# Patient Record
Sex: Female | Born: 1965
Health system: Southern US, Community
[De-identification: ages and names within clinical notes are randomized; demographics above are authoritative.]

## PROBLEM LIST (undated history)

## (undated) DIAGNOSIS — I4901 Ventricular fibrillation: Secondary | ICD-10-CM

## (undated) DIAGNOSIS — I2109 ST elevation (STEMI) myocardial infarction involving other coronary artery of anterior wall: Secondary | ICD-10-CM

## (undated) DIAGNOSIS — I469 Cardiac arrest, cause unspecified: Secondary | ICD-10-CM

## (undated) DIAGNOSIS — I2510 Atherosclerotic heart disease of native coronary artery without angina pectoris: Secondary | ICD-10-CM

## (undated) DIAGNOSIS — I219 Acute myocardial infarction, unspecified: Secondary | ICD-10-CM

## (undated) DIAGNOSIS — I255 Ischemic cardiomyopathy: Secondary | ICD-10-CM

## (undated) DIAGNOSIS — K219 Gastro-esophageal reflux disease without esophagitis: Secondary | ICD-10-CM

## (undated) DIAGNOSIS — T7840XA Allergy, unspecified, initial encounter: Secondary | ICD-10-CM

## (undated) DIAGNOSIS — E079 Disorder of thyroid, unspecified: Secondary | ICD-10-CM

## (undated) HISTORY — DX: Acute myocardial infarction, unspecified: I21.9

## (undated) HISTORY — DX: Ventricular fibrillation: I49.01

## (undated) HISTORY — DX: Atherosclerotic heart disease of native coronary artery without angina pectoris: I25.10

## (undated) HISTORY — DX: Cardiac arrest, cause unspecified: I46.9

## (undated) HISTORY — DX: ST elevation (STEMI) myocardial infarction involving other coronary artery of anterior wall: I21.09

## (undated) HISTORY — PX: LUMBAR DISC SURGERY: SHX700

## (undated) HISTORY — DX: Gastro-esophageal reflux disease without esophagitis: K21.9

## (undated) HISTORY — DX: Allergy, unspecified, initial encounter: T78.40XA

## (undated) HISTORY — DX: Ischemic cardiomyopathy: I25.5

---

## 2006-12-07 ENCOUNTER — Ambulatory Visit (HOSPITAL_COMMUNITY): Admission: RE | Admit: 2006-12-07 | Discharge: 2006-12-07 | Payer: Self-pay | Admitting: Family Medicine

## 2007-06-22 ENCOUNTER — Encounter: Admission: RE | Admit: 2007-06-22 | Discharge: 2007-08-03 | Payer: Self-pay | Admitting: Family Medicine

## 2008-09-20 ENCOUNTER — Ambulatory Visit (HOSPITAL_COMMUNITY): Admission: RE | Admit: 2008-09-20 | Discharge: 2008-09-21 | Payer: Self-pay | Admitting: Neurosurgery

## 2011-05-11 NOTE — Op Note (Signed)
NAMECHAELA, BRANSCUM                  ACCOUNT NO.:  [id number]   MEDICAL RECORD NO.:  [id number]          PATIENT TYPE:  OIB   LOCATION:  3533                         FACILITY:  [facility]   PHYSICIAN:  Coletta Memos, M.D.     DATE OF BIRTH:  August 10, 1966   DATE OF PROCEDURE:  DATE OF DISCHARGE:                               OPERATIVE REPORT   PREOPERATIVE DIAGNOSES:  1. Displaced disk left L5-S1.  2. Left L5 and left S1 radiculopathies.   POSTOPERATIVE DIAGNOSES:  1. Displaced disk left L5-S1.  2. Left L5 and left S1 radiculopathies.   PROCEDURE:  Left L5 semi hemilaminectomy and diskectomy.  Disk space was  not entered.  This was done with microdissection.   ANESTHESIA:  General endotracheal.   SURGEON:  Coletta Memos, MD   ASSISTANT:  Stefani Dama, MD   COMPLICATIONS:  None.   FINDINGS:  Large free fragment of disk material removed with large free  fragment of disk.   INDICATIONS:  Wretha Laris is a young lady who has been in significant  pain for approximately 1 week.  MRI showed a large disk herniation at L5-  S1.  I therefore offered and she agreed to undergo operative  decompression.  She was to the point where she was unable to walk  secondary to pain.   OPERATIVE NOTE:  Ms. Sheffield Slider was brought to the operating room, intubated,  and placed under general anesthetic.  She rolled prone onto the Wilson  frame and all pressure points were properly padded.  Her back was  prepped.  She was draped in a sterile fashion.  I infiltrated 0.5%  lidocaine with 1:200,000 epinephrine.  I opened the skin with a #10  blade and took this down to the thoracolumbar fascia.  I then exposed  the lamina of L5 and S1 on the left side.  Intraoperative x-ray showed I  was in the correct interlaminar space.  I then performed a semi  hemilaminectomy of L5 using a high-speed drill and Kerrison punches.  I  removed the ligamentum flavum and exposed the thecal sac on the left  side.  With microdissection,  I retracted thecal sac medially and  immediately was able to break into the space where the disk was.  I then  removed large fragments of disk material.  I then inspected with Dr.  Verlee Rossetti assistance  the ventral portion of the sac to make sure that no fragments were left  behind.  All the fragments that we were able to appreciate were removed.  I then irrigated.  I then closed wound in layered fashion using Vicryl  sutures.  Dermabond was used for a sterile dressing.  She was extubated  and moving all extremities.           ______________________________  Coletta Memos, M.D.     KC/MEDQ  D:  09/20/2008  T:  09/21/2008  Job:  [id number]

## 2011-09-27 LAB — CBC
Hemoglobin: 14.6
MCV: 96.1
Platelets: 284
RDW: 13.3
WBC: 10.2

## 2012-02-15 ENCOUNTER — Other Ambulatory Visit: Payer: Self-pay | Admitting: Family Medicine

## 2012-02-15 ENCOUNTER — Ambulatory Visit (HOSPITAL_COMMUNITY)
Admission: RE | Admit: 2012-02-15 | Discharge: 2012-02-15 | Disposition: A | Discharge status: Home / Self Care | Payer: BC Managed Care – PPO | Source: Ambulatory Visit | Attending: Family Medicine | Admitting: Family Medicine

## 2012-02-15 DIAGNOSIS — IMO0001 Reserved for inherently not codable concepts without codable children: Secondary | ICD-10-CM

## 2012-02-15 DIAGNOSIS — K30 Functional dyspepsia: Secondary | ICD-10-CM

## 2012-02-15 DIAGNOSIS — R109 Unspecified abdominal pain: Secondary | ICD-10-CM | POA: Insufficient documentation

## 2012-02-15 DIAGNOSIS — R1011 Right upper quadrant pain: Secondary | ICD-10-CM

## 2012-03-07 ENCOUNTER — Other Ambulatory Visit: Payer: Self-pay | Admitting: Family Medicine

## 2012-03-10 ENCOUNTER — Ambulatory Visit (HOSPITAL_COMMUNITY)
Admission: RE | Admit: 2012-03-10 | Discharge: 2012-03-10 | Disposition: A | Discharge status: Home / Self Care | Payer: Federal, State, Local not specified - PPO | Source: Ambulatory Visit | Attending: Family Medicine | Admitting: Family Medicine

## 2012-03-10 ENCOUNTER — Other Ambulatory Visit: Payer: Self-pay | Admitting: Family Medicine

## 2012-03-10 DIAGNOSIS — R1013 Epigastric pain: Secondary | ICD-10-CM | POA: Insufficient documentation

## 2012-03-10 DIAGNOSIS — R079 Chest pain, unspecified: Secondary | ICD-10-CM | POA: Insufficient documentation

## 2012-03-14 ENCOUNTER — Encounter: Payer: Self-pay | Admitting: Internal Medicine

## 2012-03-30 ENCOUNTER — Encounter: Payer: Self-pay | Admitting: Internal Medicine

## 2012-04-03 ENCOUNTER — Encounter: Payer: Self-pay | Admitting: Internal Medicine

## 2012-04-03 ENCOUNTER — Ambulatory Visit (INDEPENDENT_AMBULATORY_CARE_PROVIDER_SITE_OTHER): Payer: Federal, State, Local not specified - PPO | Admitting: Internal Medicine

## 2012-04-03 VITALS — BP 124/76 | HR 76 | Ht 65.0 in | Wt 119.8 lb

## 2012-04-03 DIAGNOSIS — F17200 Nicotine dependence, unspecified, uncomplicated: Secondary | ICD-10-CM

## 2012-04-03 DIAGNOSIS — K3189 Other diseases of stomach and duodenum: Secondary | ICD-10-CM

## 2012-04-03 DIAGNOSIS — Z720 Tobacco use: Secondary | ICD-10-CM

## 2012-04-03 DIAGNOSIS — R1013 Epigastric pain: Secondary | ICD-10-CM

## 2012-04-03 NOTE — Patient Instructions (Addendum)
You have been given a separate informational sheet regarding your tobacco use, the importance of quitting and local resources to help you quit.  You have been scheduled for an endoscopy with propofol. Please follow written instructions given to you at your visit today.  Please follow up with Dr. Rhea Belton in 1 month.

## 2012-04-04 ENCOUNTER — Encounter: Payer: Self-pay | Admitting: Internal Medicine

## 2012-04-04 DIAGNOSIS — Z87891 Personal history of nicotine dependence: Secondary | ICD-10-CM | POA: Insufficient documentation

## 2012-04-04 DIAGNOSIS — Z720 Tobacco use: Secondary | ICD-10-CM | POA: Insufficient documentation

## 2012-04-04 DIAGNOSIS — R1013 Epigastric pain: Secondary | ICD-10-CM | POA: Insufficient documentation

## 2012-04-04 NOTE — Progress Notes (Signed)
Subjective:    Patient ID: Sarah Fox, female    DOB: 01-26-1966, 46 y.o.   MRN: [id number]  HPI Sarah Fox is a 46 yo female with no significant past medical history who is seen in consultation at the request of Dr. Christell Constant for evaluation of epigastric abdominal pain.  [name] is accompanied today by a friend.  She reports a weeks ago developing constant epigastric pain which was across her upper abdomen. It also was present in her chest and mid back. This was specifically worse with eating food. She was experiencing some nausea but no vomiting. She also at that time was having heartburn. She reported belching prior to PPI, which subsequently improved. She was seen by her primary care, and initially was started on omeprazole 40 mg daily. She reports having undergone an ultrasound and an abdominal CT scan. She reports about 4 weeks after beginning the omeprazole her symptoms improved. She still has some epigastric pain, but this is significantly better than before. It still is slightly worse with eating. No further nausea or vomiting. No melena or rectal bleeding. No diarrhea or constipation. No dysphagia or odynophagia. No fevers or chills. She reports using intermittent Naprosyn and ibuprofen  Review of Systems As per history of present illness, otherwise negative  History reviewed. No pertinent past medical history.  Current Outpatient Prescriptions  Medication Sig Dispense Refill  . omeprazole (PRILOSEC) 40 MG capsule Take 40 mg by mouth daily.       No Known Allergies  Family History  Problem Relation Age of Onset  . Breast cancer Mother   . Breast cancer Sister   . Heart disease Brother   . Colon cancer Neg Hx    History  Substance Use Topics  . Smoking status: Current Everyday Smoker  . Smokeless tobacco: Never Used  . Alcohol Use: Yes     4 drinks daily       Objective:   Physical Exam BP 124/76  Pulse 76  Ht 5\' 5"  (1.651 m)  Wt 119 lb 12.8 oz (54.341 kg)  BMI 19.94  kg/m2  LMP 03/20/2012 Constitutional: Well-developed and well-nourished. No distress. HEENT: Normocephalic and atraumatic. Oropharynx is clear and moist. No oropharyngeal exudate. Conjunctivae are normal. Pupils are equal round and reactive to light. No scleral icterus. Neck: Neck supple. Trachea midline. Cardiovascular: Normal rate, regular rhythm and intact distal pulses. No M/R/G Pulmonary/chest: Effort normal and breath sounds normal. No wheezing, rales or rhonchi. Abdominal: Soft, mild epigastric tenderness without rebound or guarding, nondistended. Bowel sounds active throughout. There are no masses palpable. No hepatosplenomegaly. Extremities: no clubbing, cyanosis, or edema Lymphadenopathy: No cervical adenopathy noted. Neurological: Alert and oriented to person place and time. Skin: Skin is warm and dry. No rashes noted. Psychiatric: Normal mood and affect. Behavior is normal.  Records review COMPLETE ABDOMINAL ULTRASOUND 02/15/2012   Comparison:  None.   Findings:   Gallbladder:  No gallstones, gallbladder wall thickening, or pericholecystic fluid. No sonographic Murphy's sign elicited.   Common bile duct:  Normal measuring 2 mm in diameter.   Liver:  No focal lesion identified.  Within normal limits in parenchymal echogenicity.   IVC:  Appears normal.   Pancreas:  No focal abnormality seen.   Spleen:  Normal measuring 8.7 cm in length.   Right Kidney:  Normal measuring 11.1 cm in length.   Left Kidney:  Normal measuring 10.2 cm in length.   Abdominal aorta:  No aneurysm identified.   IMPRESSION: Normal abdominal ultrasound.  Clinical Data: Severe epigastric, back, and chest pain x 4 weeks   CT ABDOMEN AND PELVIS WITHOUT CONTRAST 03/07/2012   Technique:  Multidetector CT imaging of the abdomen and pelvis was performed following the standard protocol without intravenous contrast.   Comparison: Ultrasound dated 02/15/2012   Findings: Lung bases are clear.    Unenhanced liver is notable for a tiny lesion in the hepatic dome (series 2/image 11), too small to characterize.   Spleen, pancreas, and adrenal glands are within normal limits.   Gallbladder is unremarkable.   Kidneys are within normal limits.  No renal calculi or hydronephrosis.   No evidence of bowel obstruction.  Normal appendix.   No evidence of abdominal aortic aneurysm.   No abdominopelvic ascites.   No suspicious abdominopelvic lymphadenopathy.   Uterus and bilateral ovaries are unremarkable.   No ureteral or bladder calculi.   Mild degenerative changes of the visualized thoracolumbar spine, most prominent at L5-S1.   IMPRESSION: No evidence of bowel obstruction.  Normal appendix.   No CT findings to account for the patient's abdominal pain.     Assessment & Plan:  45 yo female with no significant past medical history who is seen in consultation at the request of Dr. Christell Constant for evaluation of epigastric abdominal pain.  1. Epigastric abd pain -- the patient does have persistent epigastric pain, but it has improved with daily PPI therapy. Her ultrasound and abdominal CT were reviewed and are reassuring. She has no evidence of gallbladder disease. The most likely diagnosis is dyspepsia/peptic ulcer disease, which likely is and has improved with PPI therapy. Given that this pain has been persistent for 8 weeks, and some part, I recommended upper endoscopy. We discussed this test today including the risks and benefits and she is agreeable to proceed. I think that she should continue on omeprazole 40 mg daily, 30 minutes to one hour before breakfast. I've asked that she call me if symptoms worsen prior to endoscopy.  One of your biggest health concerns is your smoking.  This increases your risk for most cancers and serious cardiovascular diseases such as strokes, heart attacks.  You should try your best to stop.  If you need assistance, please contact your PCP or Smoking  Cessation Class at [facility] [phone]) or [facility] Quit-Line (1-800-QUIT-NOW).

## 2012-04-17 ENCOUNTER — Ambulatory Visit (AMBULATORY_SURGERY_CENTER): Payer: Federal, State, Local not specified - PPO | Admitting: Internal Medicine

## 2012-04-17 ENCOUNTER — Encounter: Payer: Self-pay | Admitting: Internal Medicine

## 2012-04-17 VITALS — BP 126/80 | HR 80 | Temp 99.3°F | Resp 17 | Ht 65.0 in | Wt 119.0 lb

## 2012-04-17 DIAGNOSIS — R1013 Epigastric pain: Secondary | ICD-10-CM

## 2012-04-17 DIAGNOSIS — K2100 Gastro-esophageal reflux disease with esophagitis, without bleeding: Secondary | ICD-10-CM

## 2012-04-17 DIAGNOSIS — K2990 Gastroduodenitis, unspecified, without bleeding: Secondary | ICD-10-CM

## 2012-04-17 DIAGNOSIS — K3189 Other diseases of stomach and duodenum: Secondary | ICD-10-CM

## 2012-04-17 DIAGNOSIS — K2970 Gastritis, unspecified, without bleeding: Secondary | ICD-10-CM

## 2012-04-17 MED ORDER — SODIUM CHLORIDE 0.9 % IV SOLN: 500.0000 mL | INTRAVENOUS | Status: DC

## 2012-04-17 NOTE — Progress Notes (Signed)
Marrion Coy, CRNA administered propofol to the pt.  Andrey Farmer, CRNA observed Marrion Coy in the procedure room. Maw

## 2012-04-17 NOTE — Op Note (Signed)
[facility] [address] [place], [place]  [address]  ENDOSCOPY PROCEDURE REPORT  PATIENT:  Sarah Fox, Sarah Fox  MR#:  [id number] BIRTHDATE:  1966/07/31, 45 yrs. old  GENDER:  female ENDOSCOPIST:  Carie Caddy. Mimi Debellis, MD Referred by:  Rudi Heap, M.D. PROCEDURE DATE:  04/17/2012 PROCEDURE:  EGD with biopsy, 43239 ASA CLASS:  Class I INDICATIONS:  dyspepsia MEDICATIONS:   MAC sedation, administered by CRNA, propofol (Diprivan) 300 mg IV TOPICAL ANESTHETIC:  none  DESCRIPTION OF PROCEDURE:   After the risks benefits and alternatives of the procedure were thoroughly explained, informed consent was obtained.  [facility] [id number] endoscope was introduced through the mouth and advanced to the second portion of the duodenum, without limitations.  The instrument was slowly withdrawn as the mucosa was fully examined. <<PROCEDUREIMAGES>>  Irregular Z-line at the gastroesophageal junction. Multiple biopsies were obtained and sent to pathology to exclude Barrett's metaplasia.  Otherwise normal esophagus.  The stomach was entered and closely examined. The antrum, angularis, and lesser curvature were well visualized, including a retroflexed view of the cardia and fundus. The stomach wall was normally distensable. The scope passed easily through the pylorus into the duodenum. Biopsies of the antrum and body of the stomach were obtained and sent to pathology.  The duodenal bulb was normal in appearance, as was the postbulbar duodenum.    Retroflexed views revealed no abnormalities.    The scope was then withdrawn from the patient and the procedure completed.  COMPLICATIONS:  None  ENDOSCOPIC IMPRESSION: 1) Irregular Z-line at the gastroesophageal junction.  Biopsies performed to exclude Barrett's. 2) Otherwise normal esophagus. 3) Normal stomach, biopsies performed to rule out H. Pylori. 4) Normal duodenum  RECOMMENDATIONS: 1) Await pathology results 2) Continue taking your PPI  (antiacid medicine) once daily. It is best to be taken 20-30 minutes prior to breakfast meal.  Carie Caddy. Rhea Belton, MD  CC:  Rudi Heap, MD The Patient  n. eSIGNED:   Carie Caddy. Evangelynn Lochridge at 04/17/2012 03:57 PM  Duffy Bruce, [phone]

## 2012-04-17 NOTE — Progress Notes (Signed)
Patient did not experience any of the following events: a burn prior to discharge; a fall within the facility; wrong site/side/patient/procedure/implant event; or a hospital transfer or hospital admission upon discharge from the facility. (G8907) Patient did not have preoperative order for IV antibiotic SSI prophylaxis. (G8918)  

## 2012-04-17 NOTE — Progress Notes (Signed)
The pt tolerated the egd very well. Maw   

## 2012-04-17 NOTE — Patient Instructions (Signed)
CONTINUE YOUR PRILOSEC DAILY. TAKE THIS MEDICATION 30 MINS. PRIOR TO BREAKFAST MEAL.   YOU HAD AN ENDOSCOPIC PROCEDURE TODAY AT THE [facility]: Refer to the procedure report that was given to you for any specific questions about what was found during the examination.  If the procedure report does not answer your questions, please call your gastroenterologist to clarify.  If you requested that your care partner not be given the details of your procedure findings, then the procedure report has been included in a sealed envelope for you to review at your convenience later.  YOU SHOULD EXPECT: Some feelings of bloating in the abdomen. Passage of more gas than usual.  Walking can help get rid of the air that was put into your GI tract during the procedure and reduce the bloating. If you had a lower endoscopy (such as a colonoscopy or flexible sigmoidoscopy) you may notice spotting of blood in your stool or on the toilet paper. If you underwent a bowel prep for your procedure, then you may not have a normal bowel movement for a few days.  DIET: Your first meal following the procedure should be a light meal and then it is ok to progress to your normal diet.  A half-sandwich or bowl of soup is an example of a good first meal.  Heavy or fried foods are harder to digest and may make you feel nauseous or bloated.  Likewise meals heavy in dairy and vegetables can cause extra gas to form and this can also increase the bloating.  Drink plenty of fluids but you should avoid alcoholic beverages for 24 hours.  ACTIVITY: Your care partner should take you home directly after the procedure.  You should plan to take it easy, moving slowly for the rest of the day.  You can resume normal activity the day after the procedure however you should NOT DRIVE or use heavy machinery for 24 hours (because of the sedation medicines used during the test).    SYMPTOMS TO REPORT IMMEDIATELY: A gastroenterologist can be  reached at any hour.  During normal business hours, 8:30 AM to 5:00 PM Monday through Friday, call [phone].  After hours and on weekends, please call the GI answering service at [phone] who will take a message and have the physician on call contact you.   Following lower endoscopy (colonoscopy or flexible sigmoidoscopy):  Excessive amounts of blood in the stool  Significant tenderness or worsening of abdominal pains  Swelling of the abdomen that is new, acute  Fever of 100F or higher  Following upper endoscopy (EGD)  Vomiting of blood or coffee ground material  New chest pain or pain under the shoulder blades  Painful or persistently difficult swallowing  New shortness of breath  Fever of 100F or higher  Black, tarry-looking stools  FOLLOW UP: If any biopsies were taken you will be contacted by phone or by letter within the next 1-3 weeks.  Call your gastroenterologist if you have not heard about the biopsies in 3 weeks.  Our staff will call the home number listed on your records the next business day following your procedure to check on you and address any questions or concerns that you may have at that time regarding the information given to you following your procedure. This is a courtesy call and so if there is no answer at the home number and we have not heard from you through the emergency physician on call, we will assume that you  have returned to your regular daily activities without incident.  SIGNATURES/CONFIDENTIALITY: You and/or your care partner have signed paperwork which will be entered into your electronic medical record.  These signatures attest to the fact that that the information above on your After Visit Summary has been reviewed and is understood.  Full responsibility of the confidentiality of this discharge information lies with you and/or your care-partner. YOU HAD AN ENDOSCOPIC PROCEDURE TODAY AT THE [facility]: Refer to the procedure  report that was given to you for any specific questions about what was found during the examination.  If the procedure report does not answer your questions, please call your gastroenterologist to clarify.  If you requested that your care partner not be given the details of your procedure findings, then the procedure report has been included in a sealed envelope for you to review at your convenience later.  YOU SHOULD EXPECT: Some feelings of bloating in the abdomen. Passage of more gas than usual.  Walking can help get rid of the air that was put into your GI tract during the procedure and reduce the bloating. If you had a lower endoscopy (such as a colonoscopy or flexible sigmoidoscopy) you may notice spotting of blood in your stool or on the toilet paper. If you underwent a bowel prep for your procedure, then you may not have a normal bowel movement for a few days.  DIET: Your first meal following the procedure should be a light meal and then it is ok to progress to your normal diet.  A half-sandwich or bowl of soup is an example of a good first meal.  Heavy or fried foods are harder to digest and may make you feel nauseous or bloated.  Likewise meals heavy in dairy and vegetables can cause extra gas to form and this can also increase the bloating.  Drink plenty of fluids but you should avoid alcoholic beverages for 24 hours.  ACTIVITY: Your care partner should take you home directly after the procedure.  You should plan to take it easy, moving slowly for the rest of the day.  You can resume normal activity the day after the procedure however you should NOT DRIVE or use heavy machinery for 24 hours (because of the sedation medicines used during the test).    SYMPTOMS TO REPORT IMMEDIATELY: A gastroenterologist can be reached at any hour.  During normal business hours, 8:30 AM to 5:00 PM Monday through Friday, call [phone].  After hours and on weekends, please call the GI answering service at [phone] who will take a message and have the physician on call contact you.   Following lower endoscopy (colonoscopy or flexible sigmoidoscopy):  Excessive amounts of blood in the stool  Significant tenderness or worsening of abdominal pains  Swelling of the abdomen that is new, acute  Fever of 100F or higher  Following upper endoscopy (EGD)  Vomiting of blood or coffee ground material  New chest pain or pain under the shoulder blades  Painful or persistently difficult swallowing  New shortness of breath  Fever of 100F or higher  Black, tarry-looking stools  FOLLOW UP: If any biopsies were taken you will be contacted by phone or by letter within the next 1-3 weeks.  Call your gastroenterologist if you have not heard about the biopsies in 3 weeks.  Our staff will call the home number listed on your records the next business day following your procedure to check on you and address any  questions or concerns that you may have at that time regarding the information given to you following your procedure. This is a courtesy call and so if there is no answer at the home number and we have not heard from you through the emergency physician on call, we will assume that you have returned to your regular daily activities without incident.  SIGNATURES/CONFIDENTIALITY: You and/or your care partner have signed paperwork which will be entered into your electronic medical record.  These signatures attest to the fact that that the information above on your After Visit Summary has been reviewed and is understood.  Full responsibility of the confidentiality of this discharge information lies with you and/or your care-partner.

## 2012-04-18 ENCOUNTER — Telehealth: Payer: Self-pay

## 2012-04-18 NOTE — Telephone Encounter (Signed)
  Follow up Call-  Call back number 04/17/2012  Post procedure Call Back phone  # [phone]  Permission to leave phone message Yes     Patient questions:  Do you have a fever, pain , or abdominal swelling? no Pain Score  0 *  Have you tolerated food without any problems? yes  Have you been able to return to your normal activities? yes  Do you have any questions about your discharge instructions: Diet   no Medications  no Follow up visit  no  Do you have questions or concerns about your Care? no  Actions: * If pain score is 4 or above: No action needed, pain <4.  I felt some pain "where my food goes down" off and on yesterday, but no sx this am. Maw

## 2012-04-26 ENCOUNTER — Encounter: Payer: Self-pay | Admitting: Internal Medicine

## 2013-03-22 ENCOUNTER — Other Ambulatory Visit: Payer: Self-pay | Admitting: Physician Assistant

## 2013-03-22 ENCOUNTER — Telehealth: Payer: Self-pay | Admitting: Physician Assistant

## 2013-03-22 DIAGNOSIS — H109 Unspecified conjunctivitis: Secondary | ICD-10-CM

## 2013-03-22 MED ORDER — SULFACETAMIDE SODIUM 10 % OP SOLN: 1.0000 [drp] | Freq: Four times a day (QID) | OPHTHALMIC | Status: DC

## 2013-03-22 NOTE — Telephone Encounter (Signed)
rx sent to pharmacy

## 2013-03-22 NOTE — Telephone Encounter (Signed)
Work note given

## 2013-03-22 NOTE — Telephone Encounter (Signed)
See note

## 2013-03-22 NOTE — Telephone Encounter (Signed)
PT AWARE ABOUT RX WILL NEED NOTE FOR WORK

## 2013-03-22 NOTE — Telephone Encounter (Signed)
Patient called today questioning is she had to be seen or if she could just get a prescription for pink eye. She states that her grandson had it and then her granddaughter got and now she is starting to have it.

## 2013-03-22 NOTE — Telephone Encounter (Signed)
Ok work note 1 day

## 2013-04-09 ENCOUNTER — Telehealth: Payer: Self-pay | Admitting: Family Medicine

## 2013-04-09 NOTE — Telephone Encounter (Signed)
Area around tick bite red and swollen. Gave appt for 4-15.

## 2013-04-10 ENCOUNTER — Ambulatory Visit: Payer: Self-pay | Admitting: General Practice

## 2013-05-14 ENCOUNTER — Other Ambulatory Visit: Payer: Self-pay | Admitting: Nurse Practitioner

## 2013-06-19 ENCOUNTER — Other Ambulatory Visit: Payer: Self-pay | Admitting: Family Medicine

## 2013-07-20 ENCOUNTER — Other Ambulatory Visit: Payer: Self-pay | Admitting: Nurse Practitioner

## 2013-08-07 ENCOUNTER — Ambulatory Visit: Payer: Federal, State, Local not specified - PPO | Attending: Family Medicine

## 2013-08-07 DIAGNOSIS — M25519 Pain in unspecified shoulder: Secondary | ICD-10-CM | POA: Insufficient documentation

## 2013-08-07 DIAGNOSIS — M542 Cervicalgia: Secondary | ICD-10-CM | POA: Insufficient documentation

## 2013-08-07 DIAGNOSIS — IMO0001 Reserved for inherently not codable concepts without codable children: Secondary | ICD-10-CM | POA: Insufficient documentation

## 2013-08-09 ENCOUNTER — Ambulatory Visit: Payer: Federal, State, Local not specified - PPO | Admitting: Physical Therapy

## 2013-08-14 ENCOUNTER — Ambulatory Visit: Payer: Federal, State, Local not specified - PPO | Admitting: *Deleted

## 2013-08-16 ENCOUNTER — Ambulatory Visit: Payer: Federal, State, Local not specified - PPO | Admitting: *Deleted

## 2013-08-21 ENCOUNTER — Ambulatory Visit: Payer: Federal, State, Local not specified - PPO | Admitting: *Deleted

## 2013-08-21 ENCOUNTER — Encounter: Payer: Self-pay | Admitting: General Practice

## 2013-08-21 ENCOUNTER — Ambulatory Visit (INDEPENDENT_AMBULATORY_CARE_PROVIDER_SITE_OTHER): Payer: Federal, State, Local not specified - PPO | Admitting: General Practice

## 2013-08-21 VITALS — BP 145/81 | HR 64 | Temp 97.9°F | Ht 65.0 in | Wt 116.5 lb

## 2013-08-21 DIAGNOSIS — M5412 Radiculopathy, cervical region: Secondary | ICD-10-CM

## 2013-08-21 DIAGNOSIS — M542 Cervicalgia: Secondary | ICD-10-CM

## 2013-08-21 NOTE — Progress Notes (Signed)
  Subjective:    Patient ID: Sarah Fox, female    DOB: 1966-06-24, 47 y.o.   MRN: [id number]  HPI Patient presents today with neck pain. She reports onset was June 21, 2013 and was seen at urgent care. August 02, 2013 returned to urgent care with left arm tingling, shoulder pain and neck pain. She then began having physical  Therapy. Reports having 5 therapy sessions and was feeling some relief, but pain reoccurs. 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 She rate pain as 5-10 depending on position.     Review of Systems  Constitutional: Negative for fever and chills.  Respiratory: Negative for chest tightness and shortness of breath.   Cardiovascular: Negative for chest pain and palpitations.  Musculoskeletal:       Neck pain  Neurological: Negative for dizziness, weakness and headaches.       Objective:   Physical Exam  Constitutional: She is oriented to person, place, and time. She appears well-developed and well-nourished.  Cardiovascular: Normal rate, regular rhythm and normal heart sounds.   Pulmonary/Chest: Effort normal and breath sounds normal. No respiratory distress. She exhibits no tenderness.  Musculoskeletal:  Neck tenderness and tightness in trapezius muscle.   Neurological: She is alert and oriented to person, place, and time.  Skin: Skin is warm and dry.  Psychiatric: She has a normal mood and affect.          Assessment & Plan:  1. Neck pain  - Ambulatory referral to Orthopedic Surgery  2. Cervical radiculopathy -Continue flexeril and naproxen as instructed (sedation precaution discussed about flexeril) -patient declined hydrocodone -may use heat compresses -keep scheduled appointments -RTO if symptoms worsen -Patient verbalized understanding -Coralie Keens, FNP-C

## 2013-08-21 NOTE — Patient Instructions (Signed)
Cervical Radiculopathy  Cervical radiculopathy happens when a nerve in the neck is pinched or bruised by a slipped (herniated) disk or by arthritic changes in the bones of the cervical spine. This can occur due to an injury or as part of the normal aging process. Pressure on the cervical nerves can cause pain or numbness that runs from your neck all the way down into your arm and fingers.  CAUSES   There are many possible causes, including:   Injury.   Muscle tightness in the neck from overuse.   Swollen, painful joints (arthritis).   Breakdown or degeneration in the bones and joints of the spine (spondylosis) due to aging.   Bone spurs that may develop near the cervical nerves.  SYMPTOMS   Symptoms include pain, weakness, or numbness in the affected arm and hand. Pain can be severe or irritating. Symptoms may be worse when extending or turning the neck.  DIAGNOSIS   Your caregiver will ask about your symptoms and do a physical exam. He or she may test your strength and reflexes. X-rays, CT scans, and MRI scans may be needed in cases of injury or if the symptoms do not go away after a period of time. Electromyography (EMG) or nerve conduction testing may be done to study how your nerves and muscles are working.  TREATMENT   Your caregiver may recommend certain exercises to help relieve your symptoms. Cervical radiculopathy can, and often does, get better with time and treatment. If your problems continue, treatment options may include:   Wearing a soft collar for short periods of time.   Physical therapy to strengthen the neck muscles.   Medicines, such as nonsteroidal anti-inflammatory drugs (NSAIDs), oral corticosteroids, or spinal injections.   Surgery. Different types of surgery may be done depending on the cause of your problems.  HOME CARE INSTRUCTIONS    Put ice on the affected area.   Put ice in a plastic bag.   Place a towel between your skin and the bag.    Leave the ice on for 15-20 minutes, 3-4 times a day or as directed by your caregiver.   If ice does not help, you can try using heat. Take a warm shower or bath, or use a hot water bottle as directed by your caregiver.   You may try a gentle neck and shoulder massage.   Use a flat pillow when you sleep.   Only take over-the-counter or prescription medicines for pain, discomfort, or fever as directed by your caregiver.   If physical therapy was prescribed, follow your caregiver's directions.   If a soft collar was prescribed, use it as directed.  SEEK IMMEDIATE MEDICAL CARE IF:    Your pain gets much worse and cannot be controlled with medicines.   You have weakness or numbness in your hand, arm, face, or leg.   You have a high fever or a stiff, rigid neck.   You lose bowel or bladder control (incontinence).   You have trouble with walking, balance, or speaking.  MAKE SURE YOU:    Understand these instructions.   Will watch your condition.   Will get help right away if you are not doing well or get worse.  Document Released: 09/07/2001 Document Revised: 03/06/2012 Document Reviewed: 07/27/2011  Exit[employer] Patient Information 2014 [employer].

## 2013-08-23 ENCOUNTER — Ambulatory Visit: Payer: Federal, State, Local not specified - PPO | Admitting: *Deleted

## 2013-08-28 ENCOUNTER — Ambulatory Visit: Payer: Federal, State, Local not specified - PPO | Attending: Family Medicine

## 2013-08-28 DIAGNOSIS — IMO0001 Reserved for inherently not codable concepts without codable children: Secondary | ICD-10-CM | POA: Insufficient documentation

## 2013-08-28 DIAGNOSIS — M542 Cervicalgia: Secondary | ICD-10-CM | POA: Insufficient documentation

## 2013-08-28 DIAGNOSIS — M25519 Pain in unspecified shoulder: Secondary | ICD-10-CM | POA: Insufficient documentation

## 2013-08-31 ENCOUNTER — Other Ambulatory Visit: Payer: Self-pay | Admitting: Nurse Practitioner

## 2013-12-14 ENCOUNTER — Ambulatory Visit (INDEPENDENT_AMBULATORY_CARE_PROVIDER_SITE_OTHER): Payer: Federal, State, Local not specified - PPO | Admitting: Nurse Practitioner

## 2013-12-14 ENCOUNTER — Encounter: Payer: Self-pay | Admitting: Nurse Practitioner

## 2013-12-14 VITALS — BP 127/79 | HR 90 | Temp 97.0°F | Ht 65.0 in | Wt 121.0 lb

## 2013-12-14 DIAGNOSIS — M898X1 Other specified disorders of bone, shoulder: Secondary | ICD-10-CM

## 2013-12-14 DIAGNOSIS — M899 Disorder of bone, unspecified: Secondary | ICD-10-CM

## 2013-12-14 MED ORDER — KETOROLAC TROMETHAMINE 60 MG/2ML IM SOLN
60.0000 mg | Freq: Once | INTRAMUSCULAR | Status: AC
  Administered 2013-12-14: 60 mg via INTRAMUSCULAR

## 2013-12-14 NOTE — Progress Notes (Signed)
   Subjective:    Patient ID: Sarah Fox, female    DOB: Nov 03, 1966, 47 y.o.   MRN: [id number]  HPI Patient in c/o pulled muscle in left upper back- started 2 days ago- worse this AM.    Review of Systems  Constitutional: Negative.   Respiratory: Negative.   Cardiovascular: Negative.   Gastrointestinal: Negative.   All other systems reviewed and are negative.       Objective:   Physical Exam  Constitutional: She appears well-developed and well-nourished.  Cardiovascular: Normal rate, regular rhythm and normal heart sounds.   Pulmonary/Chest: Effort normal and breath sounds normal.  Musculoskeletal:  FROM of cervical spine and left shoulder- point tenderness along upper left scapula Grips equal bil  Neurological: She is alert. She has normal reflexes. No cranial nerve deficit.    BP 127/79  Pulse 90  Temp(Src) 97 F (36.1 C) (Oral)  Ht 5\' 5"  (1.651 m)  Wt 121 lb (54.885 kg)  BMI 20.14 kg/m2       Assessment & Plan:   1. Periscapular pain    Moist heat Rest Avoid heavy lifting with that arm Meds ordered this encounter  Medications  . ketorolac (TORADOL) injection 60 mg    Sig:    Flexeril as rx RTO prn  Mary-Margaret Daphine Deutscher, FNP

## 2014-03-05 ENCOUNTER — Ambulatory Visit (INDEPENDENT_AMBULATORY_CARE_PROVIDER_SITE_OTHER): Payer: Federal, State, Local not specified - PPO

## 2014-03-05 ENCOUNTER — Ambulatory Visit (INDEPENDENT_AMBULATORY_CARE_PROVIDER_SITE_OTHER): Payer: Federal, State, Local not specified - PPO | Admitting: General Practice

## 2014-03-05 ENCOUNTER — Encounter: Payer: Self-pay | Admitting: General Practice

## 2014-03-05 ENCOUNTER — Encounter (INDEPENDENT_AMBULATORY_CARE_PROVIDER_SITE_OTHER): Payer: Self-pay

## 2014-03-05 VITALS — BP 113/71 | HR 69 | Temp 98.6°F | Ht 65.0 in | Wt 122.6 lb

## 2014-03-05 DIAGNOSIS — M5432 Sciatica, left side: Secondary | ICD-10-CM

## 2014-03-05 DIAGNOSIS — M545 Low back pain, unspecified: Secondary | ICD-10-CM

## 2014-03-05 DIAGNOSIS — M5430 Sciatica, unspecified side: Secondary | ICD-10-CM

## 2014-03-05 MED ORDER — METHYLPREDNISOLONE ACETATE 80 MG/ML IJ SUSP
80.0000 mg | Freq: Once | INTRAMUSCULAR | Status: AC
  Administered 2014-03-05: 80 mg via INTRAMUSCULAR

## 2014-03-05 MED ORDER — PREDNISONE (PAK) 10 MG PO TABS: ORAL_TABLET | ORAL | Status: DC

## 2014-03-05 NOTE — Progress Notes (Signed)
   Subjective:    Patient ID: Sarah Fox, female    DOB: 02-Dec-1966, 48 y.o.   MRN: [id number]  Back Pain This is a new problem. The current episode started more than 1 month ago (1.5 months). The problem has been gradually worsening since onset. The pain is present in the lumbar spine. The quality of the pain is described as aching. The pain radiates to the left knee and left thigh. The pain is at a severity of 6/10. The pain is moderate. The pain is the same all the time. The symptoms are aggravated by position. Pertinent negatives include no bladder incontinence, bowel incontinence, chest pain, dysuria, fever or numbness. She has tried nothing for the symptoms.      Review of Systems  Constitutional: Negative for fever and chills.  Respiratory: Negative for chest tightness and shortness of breath.   Cardiovascular: Negative for chest pain and palpitations.  Gastrointestinal: Negative for bowel incontinence.  Genitourinary: Negative for bladder incontinence and dysuria.  Musculoskeletal: Positive for back pain.  Neurological: Negative for numbness.  All other systems reviewed and are negative.       Objective:   Physical Exam  Constitutional: She is oriented to person, place, and time. She appears well-developed and well-nourished.  Cardiovascular: Normal rate, regular rhythm and normal heart sounds.   Pulmonary/Chest: Effort normal and breath sounds normal. No respiratory distress. She exhibits no tenderness.  Musculoskeletal:  Unable to reproduce lumbar area pain  Neurological: She is alert and oriented to person, place, and time.  Skin: Skin is warm and dry.  Psychiatric: She has a normal mood and affect.    [facility] reading (PRIMARY) by Coralie Keens, FNP-C, noted change in L5-S1.       Assessment & Plan:  1. Low back pain  - DG Lumbar Spine Complete; Future  2. Sciatica of left side  - methylPREDNISolone acetate (DEPO-MEDROL) injection 80 mg; Inject 1 mL (80 mg  total) into the muscle once. - predniSONE (STERAPRED UNI-PAK) 10 MG tablet; Start on 03/06/14  Dispense: 21 tablet; Refill: 0 -discussed and provided home care instructions for sciatica -RTO if symptoms worsen or unresolved, will refer to ortho Patient verbalized understanding Coralie Keens, FNP-C

## 2014-03-05 NOTE — Patient Instructions (Signed)
Sciatica °Sciatica is pain, weakness, numbness, or tingling along the path of the sciatic nerve. The nerve starts in the lower back and runs down the back of each leg. The nerve controls the muscles in the lower leg and in the back of the knee, while also providing sensation to the back of the thigh, lower leg, and the sole of your foot. Sciatica is a symptom of another medical condition. For instance, nerve damage or certain conditions, such as a herniated disk or bone spur on the spine, pinch or put pressure on the sciatic nerve. This causes the pain, weakness, or other sensations normally associated with sciatica. Generally, sciatica only affects one side of the body. °CAUSES  °· Herniated or slipped disc. °· Degenerative disk disease. °· A pain disorder involving the narrow muscle in the buttocks (piriformis syndrome). °· Pelvic injury or fracture. °· Pregnancy. °· Tumor (rare). °SYMPTOMS  °Symptoms can vary from mild to very severe. The symptoms usually travel from the low back to the buttocks and down the back of the leg. Symptoms can include: °· Mild tingling or dull aches in the lower back, leg, or hip. °· Numbness in the back of the calf or sole of the foot. °· Burning sensations in the lower back, leg, or hip. °· Sharp pains in the lower back, leg, or hip. °· Leg weakness. °· Severe back pain inhibiting movement. °These symptoms may get worse with coughing, sneezing, laughing, or prolonged sitting or standing. Also, being overweight may worsen symptoms. °DIAGNOSIS  °Your caregiver will perform a physical exam to look for common symptoms of sciatica. He or she may ask you to do certain movements or activities that would trigger sciatic nerve pain. Other tests may be performed to find the cause of the sciatica. These may include: °· Blood tests. °· X-rays. °· Imaging tests, such as an MRI or CT scan. °TREATMENT  °Treatment is directed at the cause of the sciatic pain. Sometimes, treatment is not necessary  and the pain and discomfort goes away on its own. If treatment is needed, your caregiver may suggest: °· Over-the-counter medicines to relieve pain. °· Prescription medicines, such as anti-inflammatory medicine, muscle relaxants, or narcotics. °· Applying heat or ice to the painful area. °· Steroid injections to lessen pain, irritation, and inflammation around the nerve. °· Reducing activity during periods of pain. °· Exercising and stretching to strengthen your abdomen and improve flexibility of your spine. Your caregiver may suggest losing weight if the extra weight makes the back pain worse. °· Physical therapy. °· Surgery to eliminate what is pressing or pinching the nerve, such as a bone spur or part of a herniated disk. °HOME CARE INSTRUCTIONS  °· Only take over-the-counter or prescription medicines for pain or discomfort as directed by your caregiver. °· Apply ice to the affected area for 20 minutes, 3 4 times a day for the first 48 72 hours. Then try heat in the same way. °· Exercise, stretch, or perform your usual activities if these do not aggravate your pain. °· Attend physical therapy sessions as directed by your caregiver. °· Keep all follow-up appointments as directed by your caregiver. °· Do not wear high heels or shoes that do not provide proper support. °· Check your mattress to see if it is too soft. A firm mattress may lessen your pain and discomfort. °SEEK IMMEDIATE MEDICAL CARE IF:  °· You lose control of your bowel or bladder (incontinence). °· You have increasing weakness in the lower back,   pelvis, buttocks, or legs. °· You have redness or swelling of your back. °· You have a burning sensation when you urinate. °· You have pain that gets worse when you lie down or awakens you at night. °· Your pain is worse than you have experienced in the past. °· Your pain is lasting longer than 4 weeks. °· You are suddenly losing weight without reason. °MAKE SURE YOU: °· Understand these  instructions. °· Will watch your condition. °· Will get help right away if you are not doing well or get worse. °Document Released: 12/07/2001 Document Revised: 06/13/2012 Document Reviewed: 04/23/2012 °ExitCare® Patient Information ©2014 [employer]. ° °

## 2014-03-11 ENCOUNTER — Other Ambulatory Visit: Payer: Self-pay | Admitting: *Deleted

## 2014-03-11 MED ORDER — OMEPRAZOLE 40 MG PO CPDR: DELAYED_RELEASE_CAPSULE | ORAL | Status: DC

## 2014-07-31 ENCOUNTER — Telehealth: Payer: Self-pay | Admitting: Family Medicine

## 2014-08-06 NOTE — Telephone Encounter (Signed)
Unable to reach after multiple attempts 

## 2014-08-23 ENCOUNTER — Ambulatory Visit (INDEPENDENT_AMBULATORY_CARE_PROVIDER_SITE_OTHER): Payer: Federal, State, Local not specified - PPO | Admitting: Family

## 2014-08-23 ENCOUNTER — Encounter: Payer: Self-pay | Admitting: Family

## 2014-08-23 VITALS — BP 128/75 | HR 65 | Temp 98.5°F | Ht 65.0 in | Wt 122.8 lb

## 2014-08-23 DIAGNOSIS — L0390 Cellulitis, unspecified: Secondary | ICD-10-CM

## 2014-08-23 DIAGNOSIS — L0291 Cutaneous abscess, unspecified: Secondary | ICD-10-CM

## 2014-08-23 MED ORDER — DOXYCYCLINE HYCLATE 100 MG PO TABS: 100.0000 mg | ORAL_TABLET | Freq: Two times a day (BID) | ORAL | Status: DC

## 2014-08-23 NOTE — Patient Instructions (Signed)

## 2014-08-23 NOTE — Progress Notes (Signed)
   Subjective:    Patient ID: Sarah Fox, female    DOB: 15-Dec-1966, 48 y.o.   MRN: [id number]  HPI Pt presents to the office for abscess  on left chin/neck. Pt states she noticed it yesterday. Pt denies any known insect bite, exposure to anything new. Pt states she has "squezzed it with no drainage". Pt states it is painful aching 3 out 10. Pt denies any treatment to the area.    Review of Systems  Constitutional: Negative.   HENT: Negative.   Eyes: Negative.   Respiratory: Negative.  Negative for shortness of breath.   Cardiovascular: Negative.  Negative for palpitations.  Gastrointestinal: Negative.   Endocrine: Negative.   Genitourinary: Negative.   Musculoskeletal: Negative.   Neurological: Negative.  Negative for headaches.  Hematological: Negative.   Psychiatric/Behavioral: Negative.   All other systems reviewed and are negative.      Objective:   Physical Exam  Vitals reviewed. Constitutional: She is oriented to person, place, and time. She appears well-developed and well-nourished. No distress.  Cardiovascular: Normal rate, regular rhythm, normal heart sounds and intact distal pulses.   No murmur heard. Pulmonary/Chest: Effort normal and breath sounds normal. No respiratory distress. She has no wheezes.  Abdominal: Soft. Bowel sounds are normal. She exhibits no distension. There is no tenderness.  Musculoskeletal: Normal range of motion. She exhibits no edema and no tenderness.  Neurological: She is alert and oriented to person, place, and time. She has normal reflexes. No cranial nerve deficit.  Skin: Skin is warm and dry. Rash noted. Rash is pustular (left chin/neck, hard erythemas nodule ).  Psychiatric: She has a normal mood and affect. Her behavior is normal. Judgment and thought content normal.    BP 128/75  Pulse 65  Temp(Src) 98.5 F (36.9 C) (Oral)  Ht 5\' 5"  (1.651 m)  Wt 122 lb 12.8 oz (55.702 kg)  BMI 20.44 kg/m2       Assessment & Plan:  1.  Abscess -Do not pick or squeeze area -Warm compresses - doxycycline (VIBRA-TABS) 100 MG tablet; Take 1 tablet (100 mg total) by mouth 2 (two) times daily.  Dispense: 20 tablet; Refill: 0  Jannifer Rodney, FNP

## 2014-09-10 ENCOUNTER — Other Ambulatory Visit: Payer: Self-pay | Admitting: Nurse Practitioner

## 2014-10-07 ENCOUNTER — Encounter: Payer: Self-pay | Admitting: Family Medicine

## 2014-10-07 ENCOUNTER — Ambulatory Visit (INDEPENDENT_AMBULATORY_CARE_PROVIDER_SITE_OTHER): Payer: Federal, State, Local not specified - PPO | Admitting: Family Medicine

## 2014-10-07 VITALS — BP 129/76 | HR 75 | Temp 97.7°F | Ht 65.0 in | Wt 126.0 lb

## 2014-10-07 DIAGNOSIS — M545 Low back pain, unspecified: Secondary | ICD-10-CM

## 2014-10-07 DIAGNOSIS — M7740 Metatarsalgia, unspecified foot: Secondary | ICD-10-CM

## 2014-10-07 MED ORDER — CYCLOBENZAPRINE HCL 10 MG PO TABS: 10.0000 mg | ORAL_TABLET | Freq: Three times a day (TID) | ORAL | Status: DC | PRN

## 2014-10-07 MED ORDER — KETOROLAC TROMETHAMINE 30 MG/ML IJ SOLN
30.0000 mg | Freq: Once | INTRAMUSCULAR | Status: AC
  Administered 2014-10-07: 30 mg via INTRAMUSCULAR

## 2014-10-07 MED ORDER — NAPROXEN 500 MG PO TABS: 500.0000 mg | ORAL_TABLET | Freq: Two times a day (BID) | ORAL | Status: DC

## 2014-10-07 NOTE — Progress Notes (Signed)
   Subjective:    Patient ID: Sarah Fox, female    DOB: [birth year], 48 y.o.   MRN: [id number]  HPI This 48 y.o. female presents for evaluation of back pain and bilateral foot pain.  She has hx of DDD of the LS spine and she has had back surgery.   She has pain in her bilateral feet that is worse in the am when she gets up and steps down on her feet to walk.   Review of Systems C/o back and foot pain   No chest pain, SOB, HA, dizziness, vision change, N/V, diarrhea, constipation, dysuria, urinary urgency or frequency, myalgias, arthralgias or rash.  Objective:   Physical Exam  Vital signs noted  Well developed well nourished female.  HEENT - Head atraumatic Normocephalic Respiratory - Lungs CTA bilateral Cardiac - RRR S1 and S2 without murmur MS - TTP left LS muscles and TTP bilateral metatarsal region bilateral feet.  Bilateral halux valgus and flat footed with no arches bilateral.      Assessment & Plan:  Midline low back pain without sciatica - Plan: naproxen (NAPROSYN) 500 MG tablet, cyclobenzaprine (FLEXERIL) 10 MG tablet  Metatarsalgia, unspecified laterality - Plan: naproxen (NAPROSYN) 500 MG tablet po bid x 10 days Discussed using some inserts.  Deatra Canter FNP

## 2014-10-22 ENCOUNTER — Telehealth: Payer: Self-pay | Admitting: Family Medicine

## 2014-10-22 ENCOUNTER — Other Ambulatory Visit: Payer: Self-pay | Admitting: Family Medicine

## 2014-10-22 DIAGNOSIS — M545 Low back pain, unspecified: Secondary | ICD-10-CM

## 2014-10-22 NOTE — Telephone Encounter (Signed)
Please send in referral to orthopedics

## 2014-10-28 ENCOUNTER — Telehealth: Payer: Self-pay | Admitting: Family Medicine

## 2014-10-28 NOTE — Telephone Encounter (Signed)
Please advise 

## 2014-10-29 ENCOUNTER — Other Ambulatory Visit: Payer: Self-pay | Admitting: Family Medicine

## 2014-10-29 DIAGNOSIS — M5440 Lumbago with sciatica, unspecified side: Secondary | ICD-10-CM

## 2015-01-14 ENCOUNTER — Other Ambulatory Visit: Payer: Self-pay

## 2015-01-14 DIAGNOSIS — Z1231 Encounter for screening mammogram for malignant neoplasm of breast: Secondary | ICD-10-CM

## 2015-01-23 ENCOUNTER — Other Ambulatory Visit: Payer: Self-pay

## 2015-01-23 ENCOUNTER — Ambulatory Visit
Admission: RE | Admit: 2015-01-23 | Discharge: 2015-01-23 | Disposition: A | Discharge status: Home / Self Care | Payer: Federal, State, Local not specified - PPO | Source: Ambulatory Visit

## 2015-01-23 DIAGNOSIS — Z1231 Encounter for screening mammogram for malignant neoplasm of breast: Secondary | ICD-10-CM

## 2015-03-08 ENCOUNTER — Other Ambulatory Visit: Payer: Self-pay | Admitting: Family

## 2015-05-10 ENCOUNTER — Other Ambulatory Visit: Payer: Self-pay | Admitting: Family

## 2015-06-10 ENCOUNTER — Other Ambulatory Visit: Payer: Self-pay | Admitting: Family Medicine

## 2015-07-08 ENCOUNTER — Other Ambulatory Visit: Payer: Self-pay | Admitting: Family Medicine

## 2015-07-08 ENCOUNTER — Telehealth: Payer: Self-pay | Admitting: Family Medicine

## 2015-07-08 MED ORDER — OMEPRAZOLE 40 MG PO CPDR
DELAYED_RELEASE_CAPSULE | ORAL | Status: DC
Start: 2015-07-08 — End: 2015-11-13

## 2015-07-08 NOTE — Telephone Encounter (Signed)
Omeprazole filled

## 2015-11-11 ENCOUNTER — Other Ambulatory Visit: Payer: Self-pay | Admitting: Nurse Practitioner

## 2015-11-13 ENCOUNTER — Encounter: Payer: Self-pay | Admitting: Family

## 2015-11-13 ENCOUNTER — Other Ambulatory Visit: Payer: Self-pay | Admitting: Nurse Practitioner

## 2015-11-13 ENCOUNTER — Ambulatory Visit (INDEPENDENT_AMBULATORY_CARE_PROVIDER_SITE_OTHER): Payer: Federal, State, Local not specified - PPO | Admitting: Family

## 2015-11-13 VITALS — BP 130/84 | HR 78 | Temp 97.3°F | Ht 65.0 in | Wt 118.0 lb

## 2015-11-13 DIAGNOSIS — R309 Painful micturition, unspecified: Secondary | ICD-10-CM

## 2015-11-13 DIAGNOSIS — N3000 Acute cystitis without hematuria: Secondary | ICD-10-CM

## 2015-11-13 DIAGNOSIS — R1030 Lower abdominal pain, unspecified: Secondary | ICD-10-CM

## 2015-11-13 LAB — POCT URINALYSIS DIPSTICK
Bilirubin, UA: NEGATIVE
Glucose, UA: NEGATIVE
Ketones, UA: NEGATIVE
NITRITE UA: NEGATIVE
PH UA: 8.5
Spec Grav, UA: 1.01
Urobilinogen, UA: NEGATIVE

## 2015-11-13 LAB — POCT UA - MICROSCOPIC ONLY
Casts, Ur, LPF, POC: NEGATIVE
Crystals, Ur, HPF, POC: NEGATIVE
Mucus, UA: NEGATIVE
Yeast, UA: NEGATIVE

## 2015-11-13 MED ORDER — SULFAMETHOXAZOLE-TRIMETHOPRIM 800-160 MG PO TABS: 1.0000 | ORAL_TABLET | Freq: Two times a day (BID) | ORAL | Status: DC

## 2015-11-13 NOTE — Progress Notes (Signed)
Subjective:    Patient ID: Sarah Fox, female    DOB: 1966-09-13, 49 y.o.   MRN: [id number]  Urinary Tract Infection  This is a new problem. The current episode started in the past 7 days. The problem occurs every urination. The problem has been gradually worsening. The quality of the pain is described as aching and burning. The pain is at a severity of 10/10. The pain is moderate. There has been no fever. Associated symptoms include frequency, hesitancy and urgency. Pertinent negatives include no chills, discharge, flank pain, hematuria, nausea or vomiting. She has tried increased fluids (AZO) for the symptoms. The treatment provided mild relief. There is no history of catheterization.      Review of Systems  Constitutional: Negative.  Negative for chills.  HENT: Negative.   Eyes: Negative.   Respiratory: Negative.  Negative for shortness of breath.   Cardiovascular: Negative.  Negative for palpitations.  Gastrointestinal: Negative.  Negative for nausea and vomiting.  Endocrine: Negative.   Genitourinary: Positive for hesitancy, urgency and frequency. Negative for hematuria and flank pain.  Musculoskeletal: Negative.   Neurological: Negative.  Negative for headaches.  Hematological: Negative.   Psychiatric/Behavioral: Negative.   All other systems reviewed and are negative.      Objective:   Physical Exam  Constitutional: She is oriented to person, place, and time. She appears well-developed and well-nourished. No distress.  HENT:  Head: Normocephalic and atraumatic.  Right Ear: External ear normal.  Mouth/Throat: Oropharynx is clear and moist.  Eyes: Pupils are equal, round, and reactive to light.  Neck: Normal range of motion. Neck supple. No thyromegaly present.  Cardiovascular: Normal rate, regular rhythm, normal heart sounds and intact distal pulses.   No murmur heard. Pulmonary/Chest: Effort normal and breath sounds normal. No respiratory distress. She has no wheezes.   Abdominal: Soft. Bowel sounds are normal. She exhibits no distension. There is tenderness (mild lower abd pain).  Musculoskeletal: Normal range of motion. She exhibits no edema or tenderness.  Negative for CVA tenderness   Neurological: She is alert and oriented to person, place, and time. She has normal reflexes. No cranial nerve deficit.  Skin: Skin is warm and dry.  Psychiatric: She has a normal mood and affect. Her behavior is normal. Judgment and thought content normal.  Vitals reviewed.  Results for orders placed or performed in visit on 11/13/15  POCT urinalysis dipstick  Result Value Ref Range   Color, UA gold    Clarity, UA clear    Glucose, UA negative    Bilirubin, UA negative    Ketones, UA negative    Spec Grav, UA 1.010    Blood, UA moderate    pH, UA 8.5    Protein, UA 4+    Urobilinogen, UA negative    Nitrite, UA negative    Leukocytes, UA moderate (2+) (A) Negative  POCT UA - Microscopic Only  Result Value Ref Range   WBC, Ur, HPF, POC 80-150    RBC, urine, microscopic 5-10    Bacteria, U Microscopic moderate    Mucus, UA negative    Epithelial cells, urine per micros few    Crystals, Ur, HPF, POC negative    Casts, Ur, LPF, POC negative    Yeast, UA negative       BP 130/84 mmHg  Pulse 78  Temp(Src) 97.3 F (36.3 C) (Oral)  Ht  (1.651 m)  Wt 118 lb (53.524 kg)  BMI 19.64 kg/m2  Assessment & Plan:  1. Lower abdominal pain  2. Pain passing urine - POCT urinalysis dipstick - POCT UA - Microscopic Only  3. Acute cystitis without hematuria -Force fluids AZO over the counter X2 days RTO prn Culture pending - sulfamethoxazole-trimethoprim (BACTRIM DS,SEPTRA DS) 800-160 MG tablet; Take 1 tablet by mouth 2 (two) times daily.  Dispense: 10 tablet; Refill: 0  Jannifer Rodney, FNP

## 2015-11-13 NOTE — Patient Instructions (Signed)

## 2015-11-14 NOTE — Telephone Encounter (Signed)
RX ready, Pt needs follow up

## 2015-11-15 LAB — URINE CULTURE

## 2015-12-11 ENCOUNTER — Other Ambulatory Visit: Payer: Self-pay | Admitting: Family

## 2015-12-26 ENCOUNTER — Other Ambulatory Visit: Payer: Self-pay

## 2015-12-26 DIAGNOSIS — Z1231 Encounter for screening mammogram for malignant neoplasm of breast: Secondary | ICD-10-CM

## 2016-01-27 ENCOUNTER — Ambulatory Visit
Admission: RE | Admit: 2016-01-27 | Discharge: 2016-01-27 | Disposition: A | Discharge status: Home / Self Care | Payer: Federal, State, Local not specified - PPO | Source: Ambulatory Visit

## 2016-01-27 DIAGNOSIS — Z1231 Encounter for screening mammogram for malignant neoplasm of breast: Secondary | ICD-10-CM

## 2016-03-01 ENCOUNTER — Telehealth: Payer: Self-pay | Admitting: Family

## 2016-05-04 ENCOUNTER — Other Ambulatory Visit: Payer: Self-pay | Admitting: Family

## 2016-06-01 ENCOUNTER — Other Ambulatory Visit: Payer: Self-pay | Admitting: Family

## 2016-06-01 NOTE — Telephone Encounter (Signed)
Last seen 11/13/15  [facility]

## 2016-06-07 DIAGNOSIS — S92912A Unspecified fracture of left toe(s), initial encounter for closed fracture: Secondary | ICD-10-CM | POA: Diagnosis not present

## 2016-06-07 DIAGNOSIS — S92512A Displaced fracture of proximal phalanx of left lesser toe(s), initial encounter for closed fracture: Secondary | ICD-10-CM | POA: Diagnosis not present

## 2016-06-07 DIAGNOSIS — M79675 Pain in left toe(s): Secondary | ICD-10-CM | POA: Diagnosis not present

## 2016-06-22 ENCOUNTER — Ambulatory Visit (INDEPENDENT_AMBULATORY_CARE_PROVIDER_SITE_OTHER): Payer: Federal, State, Local not specified - PPO | Admitting: Family

## 2016-06-22 ENCOUNTER — Encounter: Payer: Self-pay | Admitting: Family

## 2016-06-22 VITALS — BP 135/88 | HR 99 | Temp 97.2°F | Ht 65.0 in | Wt 118.0 lb

## 2016-06-22 DIAGNOSIS — S92912D Unspecified fracture of left toe(s), subsequent encounter for fracture with routine healing: Secondary | ICD-10-CM

## 2016-06-22 NOTE — Patient Instructions (Signed)
Toe Fracture °A toe fracture is a break in one of the toe bones (phalanges). °CAUSES °This condition may be caused by: °· Dropping a heavy object on your toe. °· Stubbing your toe. °· Overusing your toe or doing repetitive exercise. °· Twisting or stretching your toe out of place. °RISK FACTORS °This condition is more likely to develop in people who: °· Play contact sports. °· Have a bone disease. °· Have a low calcium level. °SYMPTOMS °The main symptoms of this condition are swelling and pain in the toe. The pain may get worse with standing or walking. Other symptoms include: °· Bruising. °· Stiffness. °· Numbness. °· A change in the way the toe looks. °· Broken bones that poke through the skin. °· Blood beneath the toenail. °DIAGNOSIS °This condition is diagnosed with a physical exam. You may also have X-rays. °TREATMENT  °Treatment for this condition depends on the type of fracture and its severity. Treatment may involve: °· Taping the broken toe to a toe that is next to it (buddy taping). This is the most common treatment for fractures in which the bone has not moved out of place (nondisplaced fracture). °· Wearing a shoe that has a wide, rigid sole to protect the toe and to limit its movement. °· Wearing a walking cast. °· Having a procedure to move the toe back into place. °· Surgery. This may be needed: °¨ If there are many pieces of broken bone that are out of place (displaced). °¨ If the toe joint breaks. °¨ If the bone breaks through the skin. °· Physical therapy. This is done to help regain movement and strength in the toe. °You may need follow-up X-rays to make sure that the bone is healing well and staying in position. °HOME CARE INSTRUCTIONS °If You Have a Cast: °· Do not stick anything inside the cast to scratch your skin. Doing that increases your risk of infection. °· Check the skin around the cast every day. Report any concerns to your health care provider. You may put lotion on dry skin around the  edges of the cast. Do not apply lotion to the skin underneath the cast. °· Do not put pressure on any part of the cast until it is fully hardened. This may take several hours. °· Keep the cast clean and dry. °Bathing °· Do not take baths, swim, or use a hot tub until your health care provider approves. Ask your health care provider if you can take showers. You may only be allowed to take sponge baths for bathing. °· If your health care provider approves bathing and showering, cover the cast or bandage (dressing) with a watertight plastic bag to protect it from water. Do not let the cast or dressing get wet. °Managing Pain, Stiffness, and Swelling °· If you do not have a cast, apply ice to the injured area, if directed. °¨ Put ice in a plastic bag. °¨ Place a towel between your skin and the bag. °¨ Leave the ice on for 20 minutes, 2-3 times per day. °· Move your toes often to avoid stiffness and to lessen swelling. °· Raise (elevate) the injured area above the level of your heart while you are sitting or lying down. °Driving °· Do not drive or operate heavy machinery while taking pain medicine. °· Do not drive while wearing a cast on a foot that you use for driving. °Activity °· Return to your normal activities as directed by your health care provider. Ask your health care   provider what activities are safe for you. °· Perform exercises daily as directed by your health care provider or physical therapist. °Safety °· Do not use the injured limb to support your body weight until your health care provider says that you can. Use crutches or other assistive devices as directed by your health care provider. °General Instructions °· If your toe was treated with buddy taping, follow your health care provider's instructions for changing the gauze and tape. Change it more often: °¨ The gauze and tape get wet. If this happens, dry the space between the toes. °¨ The gauze and tape are too tight and cause your toe to become pale  or numb. °· Wear a protective shoe as directed by your health care provider. If you were not given a protective shoe, wear sturdy, supportive shoes. Your shoes should not pinch your toes and should not fit tightly against your toes. °· Do not use any tobacco products, including cigarettes, chewing tobacco, or e-cigarettes. Tobacco can delay bone healing. If you need help quitting, ask your health care provider. °· Take medicines only as directed by your health care provider. °· Keep all follow-up visits as directed by your health care provider. This is important. °SEEK MEDICAL CARE IF: °· You have a fever. °· Your pain medicine is not helping. °· Your toe is cold. °· Your toe is numb. °· You still have pain after one week of rest and treatment. °· You still have pain after your health care provider has said that you can start walking again. °· You have pain, tingling, or numbness in your foot that is not going away. °SEEK IMMEDIATE MEDICAL CARE IF: °· You have severe pain. °· You have redness or inflammation in your toe that is getting worse. °· You have pain or numbness in your toe that is getting worse. °· Your toe turns blue. °  °This information is not intended to replace advice given to you by your health care provider. Make sure you discuss any questions you have with your health care provider. °  °Document Released: 12/10/2000 Document Revised: 09/03/2015 Document Reviewed: 10/09/2014 °[employer] Interactive Patient Education ©2016 [employer] ° °

## 2016-06-22 NOTE — Progress Notes (Signed)
   Subjective:    Patient ID: Sarah Fox, female    DOB: Jan 13, 1966, 50 y.o.   MRN: [id number]  HPI Pt presents to the office today to follow up with left little toe fracture. PT states she "hit" against the corner of a wall two weeks ago. PT was told it was broken and displaced. PT given a boot and buddy tape her other toe. PT denies any pain unless she hits her foot.    Review of Systems  Constitutional: Negative.   HENT: Negative.   Eyes: Negative.   Respiratory: Negative.  Negative for shortness of breath.   Cardiovascular: Negative.  Negative for palpitations.  Gastrointestinal: Negative.   Endocrine: Negative.   Genitourinary: Negative.   Musculoskeletal: Negative.   Neurological: Negative.  Negative for headaches.  Hematological: Negative.   Psychiatric/Behavioral: Negative.   All other systems reviewed and are negative.      Objective:   Physical Exam  Constitutional: She is oriented to person, place, and time. She appears well-developed and well-nourished. No distress.  Cardiovascular: Normal rate, regular rhythm, normal heart sounds and intact distal pulses.   No murmur heard. Pulmonary/Chest: Effort normal and breath sounds normal. No respiratory distress. She has no wheezes.  Musculoskeletal: Normal range of motion. She exhibits edema (left pinky toe with mild swelling and brusing) and tenderness.  Neurological: She is alert and oriented to person, place, and time.  Skin: Skin is warm and dry.  Psychiatric: She has a normal mood and affect. Her behavior is normal. Judgment and thought content normal.  Vitals reviewed.   BP 135/88 mmHg  Pulse 99  Temp(Src) 97.2 F (36.2 C) (Oral)  Ht 5\' 5"  (1.651 m)  Wt 118 lb (53.524 kg)  BMI 19.64 kg/m2       Assessment & Plan:  1. Toe fracture, left, with routine healing, subsequent encounter -Continue to buddy tape -Motrin prn for pain -Rest -Avoid reinjury  RTO prn   Jannifer Rodney, FNP

## 2016-09-28 ENCOUNTER — Other Ambulatory Visit: Payer: Self-pay | Admitting: Family

## 2016-12-13 ENCOUNTER — Encounter: Payer: Self-pay | Admitting: Family

## 2016-12-13 ENCOUNTER — Ambulatory Visit (INDEPENDENT_AMBULATORY_CARE_PROVIDER_SITE_OTHER): Payer: Federal, State, Local not specified - PPO | Admitting: Family

## 2016-12-13 VITALS — BP 118/78 | HR 80 | Temp 98.0°F | Ht 65.0 in | Wt 121.0 lb

## 2016-12-13 DIAGNOSIS — M7711 Lateral epicondylitis, right elbow: Secondary | ICD-10-CM

## 2016-12-13 MED ORDER — NAPROXEN 500 MG PO TABS: 500.0000 mg | ORAL_TABLET | Freq: Two times a day (BID) | ORAL | 0 refills | Status: DC

## 2016-12-13 MED ORDER — PREDNISONE 10 MG (21) PO TBPK: ORAL_TABLET | ORAL | 0 refills | Status: DC

## 2016-12-13 NOTE — Patient Instructions (Signed)
Tennis Elbow Tennis elbow (lateral epicondylitis) is inflammation of the outer tendons of your forearm close to your elbow. Your tendons attach your muscles to your bones. The outer tendons of your forearm are used to extend your wrist, and they attach on the outside part of your elbow. Tennis elbow is often found in people who play tennis, but anyone may get the condition from repeatedly extending the wrist or turning the forearm. What are the causes? This condition is caused by repeatedly extending your wrist and using your hands. It can result from sports or work that requires repetitive forearm movements. Tennis elbow may also be caused by an injury. What increases the risk? You have a higher risk of developing tennis elbow if you play tennis or another racquet sport. You also have a higher risk if you frequently use your hands for work. This condition is also more likely to develop in:  Musicians.  Carpenters, painters, and plumbers.  Cooks.  Cashiers.  People who work in factories.  Construction workers.  Butchers.  People who use computers. What are the signs or symptoms? Symptoms of this condition include:  Pain and tenderness in your forearm and the outer part of your elbow. You may only feel the pain when you use your arm, or you may feel it even when you are not using your arm.  A burning feeling that runs from your elbow through your arm.  Weak grip in your hands. How is this diagnosed? This condition may be diagnosed by medical history and physical exam. You may also have other tests, including:  X-rays.  MRI. How is this treated? Your health care provider will recommend lifestyle adjustments, such as resting and icing your arm. Treatment may also include:  Medicines for inflammation. This may include shots of cortisone if your pain continues.  Physical therapy. This may include massage or exercises.  An elbow brace. Surgery may eventually be recommended if  your pain does not go away with treatment. Follow these instructions at home: Activity  Rest your elbow and wrist as directed by your health care provider. Try to avoid any activities that caused the problem until your health care provider says that you can do them again.  If a physical therapist teaches you exercises, do all of them as directed.  If you lift an object, lift it with your palm facing upward. This lowers the stress on your elbow. Lifestyle  If your tennis elbow is caused by sports, check your equipment and make sure that:  You are using it correctly.  It is the best fit for you.  If your tennis elbow is caused by work, take breaks frequently, if you are able. Talk with your manager about how to best perform tasks in a way that is safe.  If your tennis elbow is caused by computer use, talk with your manager about any changes that can be made to your work environment. General instructions  If directed, apply ice to the painful area:  Put ice in a plastic bag.  Place a towel between your skin and the bag.  Leave the ice on for 20 minutes, 2-3 times per day.  Take medicines only as directed by your health care provider.  If you were given a brace, wear it as directed by your health care provider.  Keep all follow-up visits as directed by your health care provider. This is important. Contact a health care provider if:  Your pain does not get better with treatment.    Your pain gets worse.  You have numbness or weakness in your forearm, hand, or fingers. This information is not intended to replace advice given to you by your health care provider. Make sure you discuss any questions you have with your health care provider. Document Released: 12/13/2005 Document Revised: 08/12/2016 Document Reviewed: 12/09/2014 Elsevier Interactive Patient Education  2017 Elsevier Inc.  

## 2016-12-13 NOTE — Progress Notes (Signed)
   Subjective:    Patient ID: Sarah Fox, female    DOB: 1966/08/30, 50 y.o.   MRN: [id number]  HPI PT presents to the office today with lateral right elbow pain that started two months ago. 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 PT states tried an arm brace that did not help. 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    Review of Systems  Musculoskeletal: Positive for arthralgias (right elbow pain).  All other systems reviewed and are negative.      Objective:   Physical Exam  Constitutional: She is oriented to person, place, and time. She appears well-developed and well-nourished. No distress.  Eyes: Pupils are equal, round, and reactive to light.  Neck: Normal range of motion. Neck supple. No thyromegaly present.  Cardiovascular: Normal rate, regular rhythm, normal heart sounds and intact distal pulses.   No murmur heard. Pulmonary/Chest: Effort normal and breath sounds normal. No respiratory distress. She has no wheezes.  Abdominal: Soft. Bowel sounds are normal. She exhibits no distension. There is no tenderness.  Musculoskeletal: Normal range of motion. She exhibits tenderness (right lateral elbow tenderness with extension). She exhibits no edema.  Neurological: She is alert and oriented to person, place, and time.  Skin: Skin is warm and dry.  Psychiatric: She has a normal mood and affect. Her behavior is normal. Judgment and thought content normal.  Vitals reviewed.    BP 118/78   Pulse 80   Temp 98 F (36.7 C) (Oral)   Ht 5\' 5"  (1.651 m)   Wt 121 lb (54.9 kg)   BMI 20.14 kg/m       Assessment & Plan:  1. Lateral epicondylitis of right elbow -Rest -Ice -Wear brace while working RTO prn  - predniSONE (STERAPRED UNI-PAK 21 TAB) 10 MG (21) TBPK tablet; Use as directed  Dispense: 21 tablet;  Refill: 0 - naproxen (NAPROSYN) 500 MG tablet; Take 1 tablet (500 mg total) by mouth 2 (two) times daily with a meal.  Dispense: 30 tablet; Refill: 0  Jannifer Rodney, FNP

## 2017-01-03 ENCOUNTER — Other Ambulatory Visit: Payer: Self-pay | Admitting: Family Medicine

## 2017-01-03 DIAGNOSIS — Z1231 Encounter for screening mammogram for malignant neoplasm of breast: Secondary | ICD-10-CM

## 2017-01-11 ENCOUNTER — Telehealth: Payer: Self-pay | Admitting: Family

## 2017-01-11 NOTE — Telephone Encounter (Signed)
Needs to be seen

## 2017-01-11 NOTE — Telephone Encounter (Signed)
Patient has a follow up appointment scheduled. 

## 2017-01-13 ENCOUNTER — Ambulatory Visit: Payer: Federal, State, Local not specified - PPO | Admitting: Family

## 2017-01-17 ENCOUNTER — Ambulatory Visit (INDEPENDENT_AMBULATORY_CARE_PROVIDER_SITE_OTHER): Payer: Federal, State, Local not specified - PPO | Admitting: Pediatrics

## 2017-01-17 ENCOUNTER — Encounter: Payer: Self-pay | Admitting: Pediatrics

## 2017-01-17 VITALS — BP 130/74 | HR 88 | Temp 98.6°F | Ht 65.0 in | Wt 123.4 lb

## 2017-01-17 DIAGNOSIS — M7711 Lateral epicondylitis, right elbow: Secondary | ICD-10-CM | POA: Diagnosis not present

## 2017-01-17 NOTE — Patient Instructions (Addendum)
Tennis Elbow Rehab Ask your health care provider which exercises are safe for you. Do exercises exactly as told by your health care provider and adjust them as directed. It is normal to feel mild stretching, pulling, tightness, or discomfort as you do these exercises, but you should stop right away if you feel sudden pain or your pain gets worse. Do not begin these exercises until told by your health care provider. Stretching and range of motion exercises These exercises warm up your muscles and joints and improve the movement and flexibility of your elbow. These exercises also help to relieve pain, numbness, and tingling. Exercise A: Wrist extensor stretch  1. Extend your left / right elbow with your fingers pointing down. 2. Gently pull the palm of your left / right hand toward you until you feel a gentle stretch on the top of your forearm. 3. To increase the stretch, push your left / right hand toward the outer edge or pinkie side of your forearm. 4. Hold this position for __________ seconds. Repeat __________ times. Complete this exercise __________ times a day. If directed by your health care provider, repeat this stretch except do it with a bent elbow this time. Exercise B: Wrist flexor stretch   1. Extend your left / right elbow and turn your palm upward. 2. Gently pull your left / right palm and fingertips back so your wrist extends and your fingers point more toward the ground. 3. You should feel a gentle stretch on the inside of your forearm. 4. Hold this position for __________ seconds. Repeat __________ times. Complete this exercise __________ times a day. If directed by your health care provider, repeat this stretch except do it with a bent elbow this time. Strengthening exercises These exercises build strength and endurance in your elbow. Endurance is the ability to use your muscles for a long time, even after they get tired. Exercise C: Wrist extensors   1. Sit with your left /  right forearm palm-down and fully supported on a table or countertop. Your elbow should be resting below the height of your shoulder. 2. Let your left / right wrist extend over the edge of the surface. 3. Loosely hold a __________ weight or a piece of rubber exercise band or tubing in your left / right hand. Slowly curl your left / right hand up toward your forearm. If you are using band or tubing, hold the band or tubing in place with your other hand to provide resistance. 4. Hold this position for __________ seconds. 5. Slowly return to the starting position. Repeat __________ times. Complete this exercise __________ times a day. Exercise D: Radial deviators   1. Stand with a __________ weight in your left / righthand. Or, sit while holding a rubber exercise band or tubing with your other arm supported on a table or countertop. Position your hand so your thumb is on top. 2. Raise your hand upward in front of you so your thumb travels toward your forearm, or pull up on the rubber tubing. 3. Hold this position for __________ seconds. 4. Slowly return to the starting position. Repeat __________ times. Complete this exercise __________ times a day. Exercise E: Eccentric wrist extensors  1. Sit with your left / right forearm palm-down and fully supported on a table or countertop. Your elbow should be resting below the height of your shoulder. 2. If told by your health care provider, hold a __________ weight in your hand. 3. Let your left / right wrist   extend over the edge of the surface. 4. Use your other hand to lift up your left / right hand toward your forearm. Keep your forearm on the table. 5. Using only the muscles in your left / right hand, slowly lower your hand back down to the starting position. Repeat __________ times. Complete this exercise __________ times a day. This information is not intended to replace advice given to you by your health care provider. Make sure you discuss any  questions you have with your health care provider. Document Released: 12/13/2005 Document Revised: 08/18/2016 Document Reviewed: 09/11/2015 Elsevier Interactive Patient Education  2017 Elsevier Inc.  

## 2017-01-17 NOTE — Progress Notes (Signed)
  Subjective:   Patient ID: Sarah Fox, female    DOB: 1966/05/15, 51 y.o.   MRN: [id number] CC: Elbow Pain (Right)  HPI: Sarah Fox is a 51 y.o. 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 Allergies and medications reviewed and updated. History  Smoking Status  . Current Every Day Smoker  . Packs/day: 0.50  Smokeless Tobacco  . Never Used   ROS: Per HPI   Objective:    BP 130/74   Pulse 88   Temp 98.6 F (37 C) (Oral)   Ht 5\' 5"  (1.651 m)   Wt 123 lb 6.4 oz (56 kg)   BMI 20.53 kg/m   Wt Readings from Last 3 Encounters:  01/17/17 123 lb 6.4 oz (56 kg)  12/13/16 121 lb (54.9 kg)  06/22/16 118 lb (53.5 kg)   Gen: NAD, alert, cooperative with exam, NCAT EYES: EOMI, no conjunctival injection, or no icterus CV: WWP Resp: normal WOB MSK: TTP over lateral R epicondyle Pain in elbow with supination against resistance Able to extend R arm fully, able to flex arm completely thought with pain at most flexed position  Assessment & Plan:  Soli was seen today for elbow pain.  Diagnoses and all orders for this visit:  Lateral epicondylitis of right elbow NSAIDs regularly Not having any stomach pain with them If developed pain let us know Heat/ice Stretches for arm Ref as below -     Ambulatory referral to Sports Medicine   Follow up plan: Return if symptoms worsen or fail to improve. Rex Kras, MD [facility]

## 2017-01-19 ENCOUNTER — Ambulatory Visit (INDEPENDENT_AMBULATORY_CARE_PROVIDER_SITE_OTHER): Payer: Federal, State, Local not specified - PPO | Admitting: Family Medicine

## 2017-01-19 ENCOUNTER — Encounter: Payer: Self-pay | Admitting: Family Medicine

## 2017-01-19 DIAGNOSIS — M7711 Lateral epicondylitis, right elbow: Secondary | ICD-10-CM

## 2017-01-19 MED ORDER — NITROGLYCERIN 0.2 MG/HR TD PT24: MEDICATED_PATCH | TRANSDERMAL | 1 refills | Status: DC

## 2017-01-19 NOTE — Patient Instructions (Signed)
You have lateral epicondylitis Try to avoid painful activities as much as possible. Heat 3-4 times a day for 15 minutes at a time. Tylenol or aleve as needed for pain. Counterforce brace as directed can help unload area - wear this regularly if it provides you with relief. Hammer rotation exercise, wrist extension exercise with 1 pound weight - 3 sets of 10 once a day.   Stretching - hold for 20-30 seconds and repeat 3 times. Nitro patches 1/4th patch to affected elbow, change daily. Consider physical therapy, injection if not improving. Follow up in 6 weeks.

## 2017-01-24 DIAGNOSIS — M25521 Pain in right elbow: Secondary | ICD-10-CM | POA: Insufficient documentation

## 2017-01-24 NOTE — Assessment & Plan Note (Signed)
Shown home exercises to do daily.  Tylenol, heat.  Counterforce brace.  Start nitro patches (discussed risks of headache, skin irritation).  If not improving consider physical therapy, injection.  F/u in 6 weeks.

## 2017-01-24 NOTE — Progress Notes (Signed)
PCP: Rudi Heap MD Consultation requested by: Rex Kras MD  Subjective:   HPI: Patient is a 51 y.o. female here for right elbow pain.  Patient reports she's had 3 months of lateral right elbow pain. No injury or trauma. No swelling. Pain is constant, sharp. Pain level 5/10 but up to 8/10 at worst. Bothers her with wrist motions. 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 Tried prednisone, naproxen, ibuprofen with transient relief. Right handed.  Past Medical History:  Diagnosis Date  . Allergy   . GERD (gastroesophageal reflux disease)     Current Outpatient Prescriptions on File Prior to Visit  Medication Sig Dispense Refill  . naproxen (NAPROSYN) 500 MG tablet Take 1 tablet (500 mg total) by mouth 2 (two) times daily with a meal. (Patient not taking: Reported on 01/17/2017) 30 tablet 0  . omeprazole (PRILOSEC) 40 MG capsule TAKE 1 CAPSULE EVERY DAY (Patient not taking: Reported on 01/17/2017) 30 capsule 3   No current facility-administered medications on file prior to visit.     Past Surgical History:  Procedure Laterality Date  . LUMBAR DISC SURGERY     L5-S1    No Known Allergies  Social History   Social History  . Marital status: Divorced    Spouse name: N/A  . Number of children: 1  . Years of education: N/A   Occupational History  . 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 Smoking status: Current Every Day Smoker    Packs/day: 0.50  . Smokeless tobacco: Never Used  . Alcohol use Yes     Comment: 4 drinks daily  . Drug use: No  . Sexual activity: Yes     Comment: ptp states she cannot be pregnant, but not using anything to not get prenant   Other Topics Concern  . Not on file   Social History Narrative  . No narrative on file    Family History  Problem Relation Age of Onset  . Breast cancer Mother   . Breast cancer Sister   . Heart disease Brother   . Colon cancer Neg Hx   . Esophageal cancer Neg Hx   .  Rectal cancer Neg Hx   . Stomach cancer Neg Hx     BP 126/87   Pulse 78   Ht 5\' 5"  (1.651 m)   Wt 115 lb (52.2 kg)   BMI 19.14 kg/m   Review of Systems: See HPI above.     Objective:  Physical Exam:  Gen: NAD, comfortable in exam room  Right elbow: No gross deformity, swelling, bruising. TTP lateral epicondyle and just distal to this.  No other tenderness. FROM without pain.  FROM wrist with pain on resisted extension and 3rd digit extension.  Mild pain with supination. Collateral ligaments intact. NVI distally.  Left elbow: FROM without pain.   Assessment & Plan:  1. Right lateral epicondylitis - Shown home exercises to do daily.  Tylenol, heat.  Counterforce brace.  Start nitro patches (discussed risks of headache, skin irritation).  If not improving consider physical therapy, injection.  F/u in 6 weeks.

## 2017-01-27 ENCOUNTER — Ambulatory Visit
Admission: RE | Admit: 2017-01-27 | Discharge: 2017-01-27 | Disposition: A | Discharge status: Home / Self Care | Payer: Federal, State, Local not specified - PPO | Source: Ambulatory Visit | Attending: Family Medicine | Admitting: Family Medicine

## 2017-01-27 DIAGNOSIS — Z1231 Encounter for screening mammogram for malignant neoplasm of breast: Secondary | ICD-10-CM | POA: Diagnosis not present

## 2017-01-30 ENCOUNTER — Other Ambulatory Visit: Payer: Self-pay | Admitting: Family

## 2017-03-02 ENCOUNTER — Encounter: Payer: Self-pay | Admitting: Family Medicine

## 2017-03-02 ENCOUNTER — Ambulatory Visit (INDEPENDENT_AMBULATORY_CARE_PROVIDER_SITE_OTHER): Payer: Federal, State, Local not specified - PPO | Admitting: Family Medicine

## 2017-03-02 VITALS — BP 134/84 | HR 83 | Ht 65.0 in | Wt 115.0 lb

## 2017-03-02 DIAGNOSIS — M7711 Lateral epicondylitis, right elbow: Secondary | ICD-10-CM | POA: Diagnosis not present

## 2017-03-02 NOTE — Progress Notes (Signed)
PCP: Rudi Heap MD Consultation requested by: Rex Kras MD  Subjective:   HPI: Patient is a 51 y.o. female here for right elbow pain.  1/24: Patient reports she's had 3 months of lateral right elbow pain. No injury or trauma. No swelling. Pain is constant, sharp. Pain level 5/10 but up to 8/10 at worst. Bothers her with wrist motions. 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 Tried prednisone, naproxen, ibuprofen with transient relief. Right handed.  3/7: Patient returns with mild improvement from last visit. Pain level 3/10, still sharp - sometimes gets palmar feeling like this hand is on fire. Worse lifting packages. Is doing home exercises, using nitro patches. Had headache one day with the patches. No skin changes, numbness.  Past Medical History:  Diagnosis Date  . Allergy   . GERD (gastroesophageal reflux disease)     Current Outpatient Prescriptions on File Prior to Visit  Medication Sig Dispense Refill  . naproxen (NAPROSYN) 500 MG tablet Take 1 tablet (500 mg total) by mouth 2 (two) times daily with a meal. (Patient not taking: Reported on 01/17/2017) 30 tablet 0  . nitroGLYCERIN (NITRODUR - DOSED IN MG/24 HR) 0.2 mg/hr patch Apply 1/4th patch to affected elbow, change daily 30 patch 1  . omeprazole (PRILOSEC) 40 MG capsule TAKE 1 CAPSULE EVERY DAY 30 capsule 5   No current facility-administered medications on file prior to visit.     Past Surgical History:  Procedure Laterality Date  . LUMBAR DISC SURGERY     L5-S1    No Known Allergies  Social History   Social History  . Marital status: Divorced    Spouse name: N/A  . Number of children: 1  . Years of education: N/A   Occupational History  . 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 Smoking status: Current Every Day Smoker    Packs/day: 0.50  . Smokeless tobacco: Never Used  . Alcohol use Yes     Comment: 4 drinks daily  . Drug use: No  . Sexual activity:  Yes     Comment: ptp states she cannot be pregnant, but not using anything to not get prenant   Other Topics Concern  . Not on file   Social History Narrative  . No narrative on file    Family History  Problem Relation Age of Onset  . Breast cancer Mother   . Breast cancer Sister   . Heart disease Brother   . Colon cancer Neg Hx   . Esophageal cancer Neg Hx   . Rectal cancer Neg Hx   . Stomach cancer Neg Hx     BP 134/84   Pulse 83   Ht 5\' 5"  (1.651 m)   Wt 115 lb (52.2 kg)   BMI 19.14 kg/m   Review of Systems: See HPI above.     Objective:  Physical Exam:  Gen: NAD, comfortable in exam room  Right elbow: No gross deformity, swelling, bruising. TTP lateral epicondyle and just distal to this.  No other tenderness. FROM elbow without pain.  FROM wrist with pain on resisted extension and 3rd digit extension.  Mild pain with supination. Collateral ligaments intact. NVI distally.  Left elbow: FROM without pain.   Assessment & Plan:  1. Right lateral epicondylitis - Continue home exercises.  Will add physical therapy.  Tylenol, heat if needed.  Continue nitro patches - consider increase to 1/2 a day.  Consider injection if not improving.  F/u in 6 weeks.

## 2017-03-02 NOTE — Patient Instructions (Signed)
You have lateral epicondylitis Try to avoid painful activities as much as possible. Heat 3-4 times a day for 15 minutes at a time. Tylenol or aleve as needed for pain. Counterforce brace as directed can help unload area - wear this regularly if it provides you with relief. Hammer rotation exercise, wrist extension exercise with 1 pound weight - 3 sets of 10 once a day.   Stretching - hold for 20-30 seconds and repeat 3 times. Start physical therapy and do home exercises on days you don't go to therapy. Continue the nitro patches - consider increasing to 1/2 patch a day. Consider injection if not improving. Follow up in 6 weeks.

## 2017-03-07 ENCOUNTER — Ambulatory Visit: Payer: Federal, State, Local not specified - PPO | Admitting: Physical Therapy

## 2017-03-10 ENCOUNTER — Encounter: Payer: Self-pay | Admitting: Physical Therapy

## 2017-03-10 ENCOUNTER — Ambulatory Visit: Payer: Federal, State, Local not specified - PPO | Attending: Family Medicine | Admitting: Physical Therapy

## 2017-03-10 DIAGNOSIS — M25521 Pain in right elbow: Secondary | ICD-10-CM | POA: Diagnosis not present

## 2017-03-10 NOTE — Therapy (Signed)
[facility] [address] [place], [place], [address] Phone: [phone]   Fax:  [phone]  Physical Therapy Evaluation  Patient Details  Name: LEGACIE DETORE MRN: [id number] Date of Birth: [birth year] Referring Provider: Norton Blizzard MD.  Encounter Date: 03/10/2017      PT End of Session - 03/10/17 1238    Visit Number 1   Number of Visits 12   Date for PT Re-Evaluation 04/21/17   PT Start Time 1200   PT Stop Time 1238   PT Time Calculation (min) 38 min      Past Medical History:  Diagnosis Date  . Allergy   . GERD (gastroesophageal reflux disease)     Past Surgical History:  Procedure Laterality Date  . LUMBAR DISC SURGERY     L5-S1    There were no vitals filed for this visit.       Subjective Assessment - 03/10/17 1255    Subjective The patient presents to OPPT with c/o right elbow pain.  This has been an ongoing and worsenening (since December 2017) problem related to repetitive UE movement working for the [employer].  She has been performing Nitroglycerin patches which have helped a lot.  Her resting pain-level is a 4/10 today but can increase to higher levels (7-7+/10) with use of her right UE and movement after her right elbow has been at rest for prolonged periods of time.              [facility] PT Assessment - 03/10/17 0001      Assessment   Medical Diagnosis Right lateral epicondylitis.   Referring Provider Norton Blizzard MD.   Onset Date/Surgical Date --  December 2017.     Precautions   Precautions None     Restrictions   Weight Bearing Restrictions No     Balance Screen   Has the patient fallen in the past 6 months No   Has the patient had a decrease in activity level because of a fear of falling?  No   Is the patient reluctant to leave their home because of a fear of falling?  No     Home Environment   Living Environment Private residence     Prior Function   Level of Independence Independent     ROM / Strength   AROM / PROM / Strength AROM;Strength     AROM   Overall AROM Comments Full right active ROM.     Strength   Overall Strength Comments Right grip= 25# and left= 50#.     Palpation   Palpation comment Tender to palpation over and adjacent to right lateral epicondyl.     Ambulation/Gait   Gait Comments WNL.                   [facility] Adult PT Treatment/Exercise - 03/10/17 0001      Modalities   Modalities Electrical Stimulation;Moist Heat     Moist Heat Therapy   Number Minutes Moist Heat 15 Minutes   Moist Heat Location --  Right elbow.     Programme researcher, broadcasting/film/video Location --  Right lateral elbow.   Electrical Stimulation Action --  Pre-mod.   Electrical Stimulation Parameters 80-150 Hz x 15 minutes.   Electrical Stimulation Goals Pain                     PT Long Term Goals - 03/10/17 1256      PT LONG  TERM GOAL #1   Title Independent with a HEP.   Time 6   Period Weeks   Status New     PT LONG TERM GOAL #2   Title Carry 8# 500 feet with right elbow pain not > 2-3/10.   Time 6   Period Weeks   Status New     PT LONG TERM GOAL #3   Title Perform ADL's wiht pain not > 2-3/10.   Time 6   Period Weeks   Status New     PT LONG TERM GOAL #4   Title Increase right grip strength to 40#.   Time 6   Period Weeks   Status New               Plan - 03/10/17 1248    Clinical Impression Statement The patient has right lateral elbow pain increasing with activity and right elbow movement after prolonged immobility.  Her right grip is significantly weaker than her left non-dominant side.  Her pain limites her ability to perform ADL's.  The patient will benefit from skilled physical therapy.   Rehab Potential Excellent   PT Frequency 2x / week   PT Duration 6 weeks   PT Treatment/Interventions ADLs/Self Care Home Management;Electrical Stimulation;Iontophoresis 4mg /ml Dexamethasone;Cryotherapy;Moist  Heat;Ultrasound;Patient/family education;Therapeutic exercise;Therapeutic activities;Manual techniques;Dry needling   PT Next Visit Plan IASTM to right lateral elbow; combo e'stim/U/S at 50% 3.30 MHZ; Cold and heat with e'stim; dry needling is a possibility; will send order for Iontophoresis (look under "other orders" tab; can start light dumbbell exercises for right extension strengthening and theraputty for grip strengthening as long as the exercises do not increase pain.   Consulted and Agree with Plan of Care Patient      Patient will benefit from skilled therapeutic intervention in order to improve the following deficits and impairments:  Pain, Decreased activity tolerance, Decreased strength  Visit Diagnosis: Pain in right elbow - Plan: PT plan of care cert/re-cert     Problem List Patient Active Problem List   Diagnosis Date Noted  . Right lateral epicondylitis 01/24/2017  . Ulcer-like dyspepsia 04/04/2012  . Tobacco use 04/04/2012    Yalanda Soderman, [place] MPT 03/10/2017, 1:06 PM  [facility] [address] [place], [place], [address] Phone: [phone]   Fax:  [phone]  Name: ONESTI BONFIGLIO MRN: [id number] Date of Birth: March 06, 1966

## 2017-03-14 ENCOUNTER — Ambulatory Visit: Payer: Federal, State, Local not specified - PPO | Admitting: Physical Therapy

## 2017-03-14 DIAGNOSIS — M25521 Pain in right elbow: Secondary | ICD-10-CM

## 2017-03-14 NOTE — Therapy (Signed)
[facility] [address] [place], [place], [address] Phone: [phone]   Fax:  [phone]  Physical Therapy Treatment  Patient Details  Name: Sarah Fox MRN: [id number] Date of Birth: Oct 12, 1966 Referring Provider: Norton Blizzard MD.  Encounter Date: 03/14/2017      PT End of Session - 03/14/17 1520    Visit Number 2   Number of Visits 12   Date for PT Re-Evaluation 04/21/17   PT Start Time 1520   PT Stop Time 1601   PT Time Calculation (min) 41 min   Activity Tolerance Patient tolerated treatment well   Behavior During Therapy [facility] for tasks assessed/performed      Past Medical History:  Diagnosis Date  . Allergy   . GERD (gastroesophageal reflux disease)     Past Surgical History:  Procedure Laterality Date  . LUMBAR DISC SURGERY     L5-S1    There were no vitals filed for this visit.      Subjective Assessment - 03/14/17 1520    Subjective Reports that elbow is about the same and has good days and bad days.   Patient Stated Goals Use right UE without pain again.   Currently in Pain? Yes   Pain Score 4    Pain Location Elbow   Pain Orientation Right   Pain Descriptors / Indicators Discomfort   Pain Type Chronic pain   Pain Onset More than a month ago   Pain Frequency Constant                         [facility] Adult PT Treatment/Exercise - 03/14/17 0001      Modalities   Modalities Electrical Stimulation;Moist Heat;Ultrasound;Iontophoresis     Moist Heat Therapy   Number Minutes Moist Heat 10 Minutes   Moist Heat Location Elbow     Electrical Stimulation   Electrical Stimulation Location R lateral elbow   Electrical Stimulation Action Pre-Mod   Electrical Stimulation Parameters 80-150 hz x10 min   Electrical Stimulation Goals Pain     Ultrasound   Ultrasound Location R lateral epicondyle/extensor tendons   Ultrasound Parameters 1.0 w/cm2, 100%, 3.3 mhz x10 min   Ultrasound Goals Pain      Iontophoresis   Type of Iontophoresis Dexamethasone   Location R lateral epicondyle   Dose 1.0 ml  1/6   Time 8     Manual Therapy   Manual Therapy Myofascial release   Myofascial Release IASTW to R epicondyle/ extensor tendon                     PT Long Term Goals - 03/10/17 1256      PT LONG TERM GOAL #1   Title Independent with a HEP.   Time 6   Period Weeks   Status New     PT LONG TERM GOAL #2   Title Carry 8# 500 feet with right elbow pain not > 2-3/10.   Time 6   Period Weeks   Status New     PT LONG TERM GOAL #3   Title Perform ADL's wiht pain not > 2-3/10.   Time 6   Period Weeks   Status New     PT LONG TERM GOAL #4   Title Increase right grip strength to 40#.   Time 6   Period Weeks   Status New  Plan - 03/14/17 1633    Clinical Impression Statement Patient presented in treatment with nitroglycerin patch intact over R lateral epicondyle in which she removed. Patient was educated regarding rationale for [place] and manual therapy per MPT POC. Patient very tender to touch directly over the R lateral epidcondyle. Normal modalities response noted following removal of the modalities. Minimal petiche noted with IASTW to R lateral epicondyle and wrist extensors tendons. Patient educated that she may experience soreness around treated region. Iontophoresis order signed off by MD thus initial treatment of iontphoresis with dexamethasone started. Patient educated to remove iontphoresis patch placed R lateral epicondyle region in 4 hours and to wait one hour to start nitroglycerin patch again per directions by MPT.   Rehab Potential Excellent   PT Frequency 2x / week   PT Duration 6 weeks   PT Treatment/Interventions ADLs/Self Care Home Management;Electrical Stimulation;Iontophoresis 4mg /ml Dexamethasone;Cryotherapy;Moist Heat;Ultrasound;Patient/family education;Therapeutic exercise;Therapeutic activities;Manual techniques;Dry needling    PT Next Visit Plan Continue with MPT POC with modalities and manual therapy.   Consulted and Agree with Plan of Care Patient      Patient will benefit from skilled therapeutic intervention in order to improve the following deficits and impairments:  Pain, Decreased activity tolerance, Decreased strength  Visit Diagnosis: Pain in right elbow     Problem List Patient Active Problem List   Diagnosis Date Noted  . Right lateral epicondylitis 01/24/2017  . Ulcer-like dyspepsia 04/04/2012  . Tobacco use 04/04/2012    Evelene Croon, PTA 03/14/2017, 4:43 PM  [facility] [address] [place], [place], [address] Phone: [phone]   Fax:  [phone]  Name: Sarah Fox MRN: [id number] Date of Birth: Oct 30, 1966

## 2017-03-21 ENCOUNTER — Ambulatory Visit: Payer: Federal, State, Local not specified - PPO | Admitting: Physical Therapy

## 2017-03-21 DIAGNOSIS — M25521 Pain in right elbow: Secondary | ICD-10-CM

## 2017-03-21 NOTE — Therapy (Signed)
[facility] [address] [place], [place], [address] Phone: [phone]   Fax:  [phone]  Physical Therapy Treatment  Patient Details  Name: Sarah Fox MRN: [id number] Date of Birth: 06-Mar-1966 Referring Provider: Norton Blizzard MD.  Encounter Date: 03/21/2017      PT End of Session - 03/21/17 1215    Visit Number 3   Number of Visits 12   Date for PT Re-Evaluation 04/21/17   PT Start Time 0945   PT Stop Time 1031   PT Time Calculation (min) 46 min   Activity Tolerance Patient tolerated treatment well   Behavior During Therapy [facility] for tasks assessed/performed      Past Medical History:  Diagnosis Date  . Allergy   . GERD (gastroesophageal reflux disease)     Past Surgical History:  Procedure Laterality Date  . LUMBAR DISC SURGERY     L5-S1    There were no vitals filed for this visit.      Subjective Assessment - 03/21/17 1216    Subjective My elbow hurt a lot yesterday and still hurts today.   Patient Stated Goals Use right UE without pain again.   Pain Score 7    Pain Location Elbow   Pain Orientation Right   Pain Descriptors / Indicators Discomfort   Pain Type Chronic pain   Pain Onset More than a month ago                         [facility] Adult PT Treatment/Exercise - 03/21/17 0001      Modalities   Modalities Cryotherapy;Electrical Stimulation     Cryotherapy   Number Minutes Cryotherapy 15 Minutes   Cryotherapy Location --  Left elbow.   Type of Cryotherapy Ice pack     Electrical Stimulation   Electrical Stimulation Location --  Right lateral epicondylar region.   Electrical Stimulation Action Pre-mod.   Electrical Stimulation Parameters 80-150 Hz x 15 minutes.   Electrical Stimulation Goals Pain     Ultrasound   Ultrasound Location Right lateral epicondylar region.   Ultrasound Parameters 1.20 w/CM2 x 12 minutes at 3.3 MHZ.   Ultrasound Goals Pain     Manual Therapy   Myofascial Release IASTM x 11 minutes.                     PT Long Term Goals - 03/10/17 1256      PT LONG TERM GOAL #1   Title Independent with a HEP.   Time 6   Period Weeks   Status New     PT LONG TERM GOAL #2   Title Carry 8# 500 feet with right elbow pain not > 2-3/10.   Time 6   Period Weeks   Status New     PT LONG TERM GOAL #3   Title Perform ADL's wiht pain not > 2-3/10.   Time 6   Period Weeks   Status New     PT LONG TERM GOAL #4   Title Increase right grip strength to 40#.   Time 6   Period Weeks   Status New             Patient will benefit from skilled therapeutic intervention in order to improve the following deficits and impairments:     Visit Diagnosis: Pain in right elbow     Problem List Patient Active Problem List   Diagnosis Date Noted  . Right  lateral epicondylitis 01/24/2017  . Ulcer-like dyspepsia 04/04/2012  . Tobacco use 04/04/2012    APPLEGATE, [place] MPT 03/21/2017, 12:28 PM  [facility] [address] [place], [place], [address] Phone: [phone]   Fax:  [phone]  Name: Sarah Fox MRN: [id number] Date of Birth: [birth year]

## 2017-03-28 ENCOUNTER — Encounter: Payer: Self-pay | Admitting: Physical Therapy

## 2017-03-28 ENCOUNTER — Ambulatory Visit: Payer: Federal, State, Local not specified - PPO | Attending: Family Medicine | Admitting: Physical Therapy

## 2017-03-28 DIAGNOSIS — M25521 Pain in right elbow: Secondary | ICD-10-CM | POA: Diagnosis not present

## 2017-03-28 NOTE — Therapy (Signed)
[facility] [address] [place], [place], [address] Phone: [phone]   Fax:  [phone]  Physical Therapy Treatment  Patient Details  Name: Sarah Fox MRN: [id number] Date of Birth: September 21, 1966 Referring Provider: Norton Blizzard MD.  Encounter Date: 03/28/2017      PT End of Session - 03/28/17 1018    Visit Number 4   Number of Visits 12   Date for PT Re-Evaluation 04/21/17   PT Start Time 0945   PT Stop Time 1030   PT Time Calculation (min) 45 min   Activity Tolerance Patient tolerated treatment well   Behavior During Therapy [facility] for tasks assessed/performed      Past Medical History:  Diagnosis Date  . Allergy   . GERD (gastroesophageal reflux disease)     Past Surgical History:  Procedure Laterality Date  . LUMBAR DISC SURGERY     L5-S1    There were no vitals filed for this visit.      Subjective Assessment - 03/28/17 0952    Subjective My elbow feels better! I can hold and lift a coffee cup   Currently in Pain? Yes   Pain Score 2    Pain Location Elbow   Pain Orientation Right   Pain Descriptors / Indicators Aching   Pain Type Chronic pain   Pain Onset More than a month ago                         [facility] Adult PT Treatment/Exercise - 03/28/17 0001      Exercises   Exercises Elbow;Hand     Elbow Exercises   Forearm Supination Strengthening;Right;20 reps   Forearm Pronation Strengthening;Right;20 reps   Wrist Flexion Strengthening;Right;20 reps   Wrist Extension Strengthening;Right;20 reps   Other elbow exercises wrist ulnar/radial deviation x 20   Other elbow exercises       Hand Exercises   Theraputty - Grip yellow putty x 3 minutes     Cryotherapy   Number Minutes Cryotherapy 15 Minutes   Cryotherapy Location --  Rt elbow   Type of Cryotherapy Ice pack     Electrical Stimulation   Electrical Stimulation Location Rt elbow   Electrical Stimulation Action premod   Electrical Stimulation Parameters 15 minutes   Electrical Stimulation Goals Pain     Manual Therapy   Myofascial Release STW Rt elbow and wrist mm                PT Education - 03/28/17 1018    Education provided Yes   Education Details theraputty use at home   Person(s) Educated Patient   Methods Explanation;Demonstration   Comprehension Returned demonstration;Verbalized understanding             PT Long Term Goals - 03/28/17 1020      PT LONG TERM GOAL #1   Title Independent with a HEP.   Status On-going     PT LONG TERM GOAL #2   Title Carry 8# 500 feet with right elbow pain not > 2-3/10.   Status On-going     PT LONG TERM GOAL #3   Title Perform ADL's wiht pain not > 2-3/10.   Status On-going     PT LONG TERM GOAL #4   Title Increase right grip strength to 40#.   Status On-going  25# on 4/2               Plan - 03/28/17 1018  Clinical Impression Statement Pt with decreased pain, able to tolerate 2# weight with all wrist/elbow exercises. tender after exercise, improves with STW and modalities   Rehab Potential Excellent   PT Frequency 2x / week   PT Duration 6 weeks   PT Treatment/Interventions ADLs/Self Care Home Management;Electrical Stimulation;Iontophoresis 4mg /ml Dexamethasone;Cryotherapy;Moist Heat;Ultrasound;Patient/family education;Therapeutic exercise;Therapeutic activities;Manual techniques;Dry needling   PT Next Visit Plan dry needling, continue therex with wts to progress towards lifting at work   [employer] and Agree with Plan of Care Patient      Patient will benefit from skilled therapeutic intervention in order to improve the following deficits and impairments:  Pain, Decreased activity tolerance, Decreased strength  Visit Diagnosis: Pain in right elbow     Problem List Patient Active Problem List   Diagnosis Date Noted  . Right lateral epicondylitis 01/24/2017  . Ulcer-like dyspepsia 04/04/2012  . Tobacco use  04/04/2012    Reggy Eye, PT, DPT 03/28/2017, 10:21 AM  [facility] [address] [place], [place], [address] Phone: [phone]   Fax:  [phone]  Name: Sarah Fox MRN: [id number] Date of Birth: [birth year]

## 2017-04-04 ENCOUNTER — Ambulatory Visit: Payer: Federal, State, Local not specified - PPO | Admitting: Physical Therapy

## 2017-04-04 ENCOUNTER — Encounter: Payer: Self-pay | Admitting: Physical Therapy

## 2017-04-04 DIAGNOSIS — M25521 Pain in right elbow: Secondary | ICD-10-CM | POA: Diagnosis not present

## 2017-04-04 NOTE — Therapy (Signed)
[facility] [address] [place], [place], [address] Phone: [phone]   Fax:  [phone]  Physical Therapy Treatment  Patient Details  Name: MARIANNE GOLIGHTLY MRN: [id number] Date of Birth: [birth year] Referring Provider: Norton Blizzard MD.  Encounter Date: 04/04/2017      PT End of Session - 04/04/17 1009    Visit Number 5   Number of Visits 12   Date for PT Re-Evaluation 04/21/17   PT Start Time 0946   PT Stop Time 1029   PT Time Calculation (min) 43 min   Activity Tolerance Patient tolerated treatment well   Behavior During Therapy [facility] for tasks assessed/performed      Past Medical History:  Diagnosis Date  . Allergy   . GERD (gastroesophageal reflux disease)     Past Surgical History:  Procedure Laterality Date  . LUMBAR DISC SURGERY     L5-S1    There were no vitals filed for this visit.      Subjective Assessment - 04/04/17 0948    Subjective Patient reported improvement overall and had no pain at work yet next day some increased pain   Patient Stated Goals Use right UE without pain again.   Currently in Pain? Yes   Pain Score 2    Pain Location Elbow   Pain Orientation Right   Pain Descriptors / Indicators Aching   Pain Type Chronic pain   Pain Onset More than a month ago   Pain Frequency Constant   Aggravating Factors  work   Pain Relieving Factors at rest/therapy                         [facility] Adult PT Treatment/Exercise - 04/04/17 0001      Elbow Exercises   Forearm Supination Strengthening;Right;20 reps  1#   Forearm Pronation Strengthening;Right;20 reps  1#   Wrist Flexion Strengthening;Right;20 reps  1#   Wrist Extension Strengthening;Right;20 reps  1#   Other elbow exercises wrist ulnar/radial deviation x 20 1#     Cryotherapy   Number Minutes Cryotherapy 15 Minutes   Type of Cryotherapy Ice pack     Electrical Stimulation   Electrical Stimulation Location Rt elbow    Electrical Stimulation Action premod    Electrical Stimulation Parameters 80-150hz  x19min   Electrical Stimulation Goals Pain     Ultrasound   Ultrasound Location right lat elbow   Ultrasound Parameters 1.2w/cm2/50%/3.1mhz x1min   Ultrasound Goals Pain     Manual Therapy   Manual Therapy Soft tissue mobilization   Myofascial Release STW Rt elbow and wrist                     PT Long Term Goals - 03/28/17 1020      PT LONG TERM GOAL #1   Title Independent with a HEP.   Status On-going     PT LONG TERM GOAL #2   Title Carry 8# 500 feet with right elbow pain not > 2-3/10.   Status On-going     PT LONG TERM GOAL #3   Title Perform ADL's wiht pain not > 2-3/10.   Status On-going     PT LONG TERM GOAL #4   Title Increase right grip strength to 40#.   Status On-going  25# on 4/2               Plan - 04/04/17 1017    Clinical Impression Statement Patient tolerated treatment  well today. Patient has ongoing pain in right elbow with work or any prolong position. Patient feels improvement overall and less pain symptoms. Patient continues to respond to treatment and has reported doing her exercises daily. Patient progressing toward goals yet ongoing due to pain and strength deficts.    Rehab Potential Excellent   PT Frequency 2x / week   PT Duration 6 weeks   PT Treatment/Interventions ADLs/Self Care Home Management;Electrical Stimulation;Iontophoresis 4mg /ml Dexamethasone;Cryotherapy;Moist Heat;Ultrasound;Patient/family education;Therapeutic exercise;Therapeutic activities;Manual techniques;Dry needling   PT Next Visit Plan dry needling, continue therex with wts to progress towards lifting at work   [employer] and Agree with Plan of Care Patient      Patient will benefit from skilled therapeutic intervention in order to improve the following deficits and impairments:  Pain, Decreased activity tolerance, Decreased strength  Visit Diagnosis: Pain in right  elbow     Problem List Patient Active Problem List   Diagnosis Date Noted  . Right lateral epicondylitis 01/24/2017  . Ulcer-like dyspepsia 04/04/2012  . Tobacco use 04/04/2012    DUNFORD, CHRISTINA P, PTA 04/04/2017, 10:30 AM  [facility] [address] [place], [place], [address] Phone: [phone]   Fax:  [phone]  Name: KRYSTYL KIEPER MRN: [id number] Date of Birth: 1966-04-07

## 2017-04-11 ENCOUNTER — Ambulatory Visit: Payer: Federal, State, Local not specified - PPO | Admitting: Physical Therapy

## 2017-04-11 ENCOUNTER — Encounter: Payer: Self-pay | Admitting: Physical Therapy

## 2017-04-11 DIAGNOSIS — M25521 Pain in right elbow: Secondary | ICD-10-CM | POA: Diagnosis not present

## 2017-04-11 NOTE — Therapy (Signed)
[facility] [place], [place], [address] Phone: [phone]   Fax:  [phone]  Physical Therapy Treatment  Patient Details  Name: Sarah Fox MRN: [id number] Date of Birth: 1966/03/09 Referring Provider: Karlton Lemon MD.  Encounter Date: 04/11/2017      PT End of Session - 04/11/17 1305    Visit Number 6   Number of Visits 12   Date for PT Re-Evaluation 04/21/17   PT Start Time 1304   PT Stop Time 1335   PT Time Calculation (min) 31 min   Activity Tolerance Patient tolerated treatment well   Behavior During Therapy [facility] for tasks assessed/performed      Past Medical History:  Diagnosis Date  . Allergy   . GERD (gastroesophageal reflux disease)     Past Surgical History:  Procedure Laterality Date  . LUMBAR DISC SURGERY     L5-S1    There were no vitals filed for this visit.      Subjective Assessment - 04/11/17 1304    Subjective Reports minimal pain today but no pain at work. Reports that she only has pain when R elbow is still for prolonged period of time.   Patient Stated Goals Use right UE without pain again.   Currently in Pain? Yes   Pain Score 1    Pain Location Elbow   Pain Orientation Right   Pain Descriptors / Indicators Aching   Pain Type Chronic pain   Pain Onset More than a month ago            [facility] PT Assessment - 04/11/17 0001      Assessment   Medical Diagnosis Right lateral epicondylitis.   Next MD Visit 04/13/2017     Precautions   Precautions None     Restrictions   Weight Bearing Restrictions No     ROM / Strength   AROM / PROM / Strength Strength     Strength   Strength Assessment Site Hand   Right/Left hand Right;Left   Right Hand Grip (lbs) 32   Left Hand Grip (lbs) 45                     [facility] Adult PT Treatment/Exercise - 04/11/17 0001      Exercises   Exercises Elbow;Wrist     Elbow Exercises   Elbow Flexion Strengthening;Right;20  reps;Seated;Bar weights/barbell   Bar Weights/Barbell (Elbow Flexion) 2 lbs   Elbow Flexion Limitations neutral hand and brachioradialis   Forearm Supination Strengthening;Right;Other reps (comment);Seated;Bar weights/barbell   Forearm Supination Limitations 2# 3x10 reps   Forearm Pronation Strengthening;Right;Other reps (comment);Seated;Bar weights/barbell   Forearm Pronation Limitations 2# 3x10 reps   Wrist Flexion Strengthening;Right;Other reps (comment);Seated;Bar weights/barbell   Bar Weights/Barbell (Wrist Flexion) 2 lbs   Wrist Flexion Limitations 3x10 reps   Wrist Extension Strengthening;Right;Other reps (comment);Seated;Bar weights/barbell   Bar Weights/Barbell (Wrist Extension) 2 lbs   Wrist Extension Limitations 3x10 reps   Other elbow exercises wrist ulnar/radial deviation 2# x30 reps each     Hand Exercises   Theraputty - Grip Orange putty x30 min   Theraputty - Pinch Orange putty x30 reps   Other Hand Exercises R red web grip x2 min,                     PT Long Term Goals - 04/11/17 1308      PT LONG TERM GOAL #1   Title Independent with a HEP.  Status Achieved     PT LONG TERM GOAL #2   Title Carry 8# 500 feet with right elbow pain not > 2-3/10.   Status Partially Met  Minimal pull reported with 7# or 9# pick up and walk 04/11/2017     PT LONG TERM GOAL #3   Title Perform ADL's wiht pain not > 2-3/10.   Status Achieved     PT LONG TERM GOAL #4   Title Increase right grip strength to 40#.   Status On-going  32# R hand grip 04/11/2017               Plan - 04/11/17 1339    Clinical Impression Statement Patient tolerated today's treatment well with no reports of any increased pain with resisted exercises. Patient does experience R elbow discomfort and stiffness with prolonged period of time in one position. Patient able to grip 32# with R hand when compared to 45# grip with L hand. Patient experienced "a little" pull in R elbow with  ambulation totaling 128 ft with 7# and 9# handweight. Patient denied any modalities today as she was having minimal pain. Patient provided orange theraputty for home use to advance grip strength.    Rehab Potential Excellent   PT Frequency 2x / week   PT Duration 6 weeks   PT Treatment/Interventions ADLs/Self Care Home Management;Electrical Stimulation;Iontophoresis '4mg'$ /ml Dexamethasone;Cryotherapy;Moist Heat;Ultrasound;Patient/family education;Therapeutic exercise;Therapeutic activities;Manual techniques;Dry needling   PT Next Visit Plan Continue per MD discretion.   Consulted and Agree with Plan of Care Patient      Patient will benefit from skilled therapeutic intervention in order to improve the following deficits and impairments:  Pain, Decreased activity tolerance, Decreased strength  Visit Diagnosis: Pain in right elbow     Problem List Patient Active Problem List   Diagnosis Date Noted  . Right lateral epicondylitis 01/24/2017  . Ulcer-like dyspepsia 04/04/2012  . Tobacco use 04/04/2012    Ahmed Prima, PTA 04/11/17 2:42 PM Mali Applegate MPT [facility] [address] [place], [place], [address] Phone: [phone]   Fax:  [phone]  Name: Sarah Fox MRN: [id number] Date of Birth: Feb 10, 1966

## 2017-04-13 ENCOUNTER — Ambulatory Visit (INDEPENDENT_AMBULATORY_CARE_PROVIDER_SITE_OTHER): Payer: Federal, State, Local not specified - PPO | Admitting: Family Medicine

## 2017-04-13 ENCOUNTER — Encounter: Payer: Self-pay | Admitting: Family Medicine

## 2017-04-13 DIAGNOSIS — M7711 Lateral epicondylitis, right elbow: Secondary | ICD-10-CM

## 2017-04-13 NOTE — Patient Instructions (Signed)
I would continue with the therapy and home exercises for at least 2-3 more visits. Take aleve only if needed, icing only if needed. Follow up with me in 6 weeks for likely final visit.

## 2017-04-14 NOTE — Progress Notes (Signed)
PCP: Rudi Heap MD Consultation requested by: Rex Kras MD  Subjective:   HPI: Patient is a 51 y.o. female here for right elbow pain.  1/24: Patient reports she's had 3 months of lateral right elbow pain. No injury or trauma. No swelling. Pain is constant, sharp. Pain level 5/10 but up to 8/10 at worst. Bothers her with wrist motions. 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 Tried prednisone, naproxen, ibuprofen with transient relief. Right handed.  3/7: Patient returns with mild improvement from last visit. Pain level 3/10, still sharp - sometimes gets palmar feeling like this hand is on fire. Worse lifting packages. Is doing home exercises, using nitro patches. Had headache one day with the patches. No skin changes, numbness.  4/18: Patient reports she is doing well. Pain level is 1/10 now, dull. Doing well with physical therapy and home exercises. Continuing to use nitro patches also. Able to lift coffee cup now and strength improving. No skin changes, numbness.  Past Medical History:  Diagnosis Date  . Allergy   . GERD (gastroesophageal reflux disease)     Current Outpatient Prescriptions on File Prior to Visit  Medication Sig Dispense Refill  . naproxen (NAPROSYN) 500 MG tablet Take 1 tablet (500 mg total) by mouth 2 (two) times daily with a meal. (Patient not taking: Reported on 01/17/2017) 30 tablet 0  . nitroGLYCERIN (NITRODUR - DOSED IN MG/24 HR) 0.2 mg/hr patch Apply 1/4th patch to affected elbow, change daily 30 patch 1  . omeprazole (PRILOSEC) 40 MG capsule TAKE 1 CAPSULE EVERY DAY 30 capsule 5   No current facility-administered medications on file prior to visit.     Past Surgical History:  Procedure Laterality Date  . LUMBAR DISC SURGERY     L5-S1    No Known Allergies  Social History   Social History  . Marital status: Married    Spouse name: N/A  . Number of children: 1  . Years of education: N/A   Occupational History   . 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 Smoking status: Current Every Day Smoker    Packs/day: 0.50  . Smokeless tobacco: Never Used  . Alcohol use Yes     Comment: 4 drinks daily  . Drug use: No  . Sexual activity: Yes     Comment: ptp states she cannot be pregnant, but not using anything to not get prenant   Other Topics Concern  . Not on file   Social History Narrative  . No narrative on file    Family History  Problem Relation Age of Onset  . Breast cancer Mother   . Breast cancer Sister   . Heart disease Brother   . Colon cancer Neg Hx   . Esophageal cancer Neg Hx   . Rectal cancer Neg Hx   . Stomach cancer Neg Hx     BP 118/79   Pulse 94   Ht 5\' 5"  (1.651 m)   Wt 115 lb (52.2 kg)   BMI 19.14 kg/m   Review of Systems: See HPI above.     Objective:  Physical Exam:  Gen: NAD, comfortable in exam room  Right elbow: No gross deformity, swelling, bruising. Minimal TTP lateral epicondyle.  No other tenderness. FROM elbow without pain.  FROM wrist with minimal pain on resisted extension and 3rd digit extension. Collateral ligaments intact. NVI distally.  Left elbow: FROM without pain.   Assessment & Plan:  1. Right  lateral epicondylitis - Clinically improving. Continue home exercises, continue PT for 2-3 more visits at least.  Advised can stop nitro.  Aleve and icing only if needed.  F/u in 6 weeks for likely final visit.

## 2017-04-14 NOTE — Assessment & Plan Note (Signed)
Clinically improving. Continue home exercises, continue PT for 2-3 more visits at least.  Advised can stop nitro.  Aleve and icing only if needed.  F/u in 6 weeks for likely final visit.

## 2017-04-19 ENCOUNTER — Ambulatory Visit: Payer: Federal, State, Local not specified - PPO | Admitting: Physical Therapy

## 2017-04-19 DIAGNOSIS — M25521 Pain in right elbow: Secondary | ICD-10-CM

## 2017-04-19 NOTE — Therapy (Signed)
[facility] [place], [place], [address] Phone: [phone]   Fax:  [phone]  Physical Therapy Treatment  Patient Details  Name: Sarah Fox MRN: [id number] Date of Birth: 1966/06/01 Referring Provider: Karlton Lemon MD.  Encounter Date: 04/19/2017      PT End of Session - 04/19/17 1458    Visit Number 7   PT Start Time 1427   PT Stop Time 1453   PT Time Calculation (min) 26 min   Activity Tolerance Patient tolerated treatment well   Behavior During Therapy [facility] for tasks assessed/performed      Past Medical History:  Diagnosis Date  . Allergy   . GERD (gastroesophageal reflux disease)     Past Surgical History:  Procedure Laterality Date  . LUMBAR DISC SURGERY     L5-S1    There were no vitals filed for this visit.      Subjective Assessment - 04/19/17 1428    Subjective better today; stopped using nitroglycerin patches and had a rough weekend.  reports pain is back to where it was last week now.  worked today without any difficulty or increase in symptoms   Patient Stated Goals Use right UE without pain again.   Currently in Pain? Yes   Pain Score 1    Pain Location Elbow   Pain Orientation Right   Pain Descriptors / Indicators Aching   Pain Type Chronic pain   Pain Onset More than a month ago   Pain Frequency Constant   Aggravating Factors  work   Pain Relieving Factors at rest/therapy            [facility] PT Assessment - 04/19/17 0001      Strength   Right Hand Grip (lbs) 35  34, 36, 35   Left Hand Grip (lbs) 49.67  50, 54, 45                     [facility] Adult PT Treatment/Exercise - 04/19/17 1454      Ambulation/Gait   Gait Comments amb carrying 9# without increase in pain     Self-Care   Self-Care Other Self-Care Comments   Other Self-Care Comments  verbally reviewed HEP, which pt demonstrated with independence.  Discussed alternative motions to decrease wrist extension  with job responsibilities - pt verbalized understanding.  Provided information on home TENS unit     Modalities   Modalities --  pt declined modalities today                PT Education - 04/19/17 1457    Education provided Yes   Education Details see self care   Person(s) Educated Patient   Methods Explanation   Comprehension Verbalized understanding             PT Long Term Goals - 04/19/17 1458      PT LONG TERM GOAL #1   Title Independent with a HEP.   Status Achieved     PT LONG TERM GOAL #2   Title Carry 8# 500 feet with right elbow pain not > 2-3/10.   Status Achieved     PT LONG TERM GOAL #3   Title Perform ADL's wiht pain not > 2-3/10.   Status Achieved     PT LONG TERM GOAL #4   Title Increase right grip strength to 40#.   Baseline 04/19/17: improved from 25# to 35#   Status Not Met  Plan - 04/19/17 1458    Clinical Impression Statement Pt has met all goals except grip strength at this time, and grip strength has improved 10# since PT eval.  Tolerated work today without any difficulty, and no longer using nitroglycerin patches.  Session spent mostly on review of HEP and goal assessment, as well as modification to work tasks and home TENS unit.  At this time feel pt is ready for d/c and transition to home program full time.  Pt in agreement.   PT Treatment/Interventions ADLs/Self Care Home Management;Electrical Stimulation;Iontophoresis 4mg/ml Dexamethasone;Cryotherapy;Moist Heat;Ultrasound;Patient/family education;Therapeutic exercise;Therapeutic activities;Manual techniques;Dry needling   PT Next Visit Plan d/c PT today   Consulted and Agree with Plan of Care Patient      Patient will benefit from skilled therapeutic intervention in order to improve the following deficits and impairments:  Pain, Decreased activity tolerance, Decreased strength  Visit Diagnosis: Pain in right elbow     Problem List Patient Active Problem  List   Diagnosis Date Noted  . Right lateral epicondylitis 01/24/2017  . Ulcer-like dyspepsia 04/04/2012  . Tobacco use 04/04/2012       Stephanie F Matthews, PT, DPT 04/19/17 3:01 PM    [facility] [address] [place], [place], [address] Phone: [phone]   Fax:  [phone]  Name: Lenya D Hale MRN: [id number] Date of Birth: 10/10/1966   

## 2017-04-19 NOTE — Patient Instructions (Signed)
   Keep up with your exercises!  Here's the information on the TENS unit if you are interested.   TENS UNIT  This is helpful for muscle pain and spasm.   Search and Purchase a TENS 7000 2nd edition at www.tenspros.com or www.amazon.com  (It should be less than $30)     TENS unit instructions:   Do not shower or bathe with the unit on  Turn the unit off before removing electrodes or batteries  If the electrodes lose stickiness add a drop of water to the electrodes after they are disconnected from the unit and place on plastic sheet. If you continued to have difficulty, call the TENS unit company to purchase more electrodes.  Do not apply lotion on the skin area prior to use. Make sure the skin is clean and dry as this will help prolong the life of the electrodes.  After use, always check skin for unusual red areas, rash or other skin difficulties. If there are any skin problems, does not apply electrodes to the same area.  Never remove the electrodes from the unit by pulling the wires.  Do not use the TENS unit or electrodes other than as directed.  Do not change electrode placement without consulting your therapist or physician.  Keep 2 fingers with between each electrode.

## 2017-05-25 ENCOUNTER — Encounter: Payer: Self-pay | Admitting: Family Medicine

## 2017-05-25 ENCOUNTER — Ambulatory Visit (INDEPENDENT_AMBULATORY_CARE_PROVIDER_SITE_OTHER): Payer: Federal, State, Local not specified - PPO | Admitting: Family Medicine

## 2017-05-25 DIAGNOSIS — M7711 Lateral epicondylitis, right elbow: Secondary | ICD-10-CM

## 2017-05-25 NOTE — Assessment & Plan Note (Signed)
Improved.  Primary issue now is brachioradialis strain/tendinopathy.  Add strengthening exercises for this and continue others.  Declined repeat PT, nitro for now.  Aleve, icing if needed.  F/u in 2-3 months.

## 2017-05-25 NOTE — Progress Notes (Signed)
PCP: Rudi Heap MD Consultation requested by: Rex Kras MD  Subjective:   HPI: Patient is a 51 y.o. female here for right elbow pain.  1/24: Patient reports she's had 3 months of lateral right elbow pain. No injury or trauma. No swelling. Pain is constant, sharp. Pain level 5/10 but up to 8/10 at worst. Bothers her with wrist motions. 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 Tried prednisone, naproxen, ibuprofen with transient relief. Right handed.  3/7: Patient returns with mild improvement from last visit. Pain level 3/10, still sharp - sometimes gets palmar feeling like this hand is on fire. Worse lifting packages. Is doing home exercises, using nitro patches. Had headache one day with the patches. No skin changes, numbness.  4/18: Patient reports she is doing well. Pain level is 1/10 now, dull. Doing well with physical therapy and home exercises. Continuing to use nitro patches also. Able to lift coffee cup now and strength improving. No skin changes, numbness.  5/30: Patient returns with right lateral elbow pain. Some worse since last visit. Pain level 2/10 at rest, up to 4/10 at worst. Done with physical therapy. Doing some home exercises. Worse when sleeping. No skin changes, numbness.  Past Medical History:  Diagnosis Date  . Allergy   . GERD (gastroesophageal reflux disease)     Current Outpatient Prescriptions on File Prior to Visit  Medication Sig Dispense Refill  . naproxen (NAPROSYN) 500 MG tablet Take 1 tablet (500 mg total) by mouth 2 (two) times daily with a meal. (Patient not taking: Reported on 01/17/2017) 30 tablet 0  . nitroGLYCERIN (NITRODUR - DOSED IN MG/24 HR) 0.2 mg/hr patch Apply 1/4th patch to affected elbow, change daily 30 patch 1  . omeprazole (PRILOSEC) 40 MG capsule TAKE 1 CAPSULE EVERY DAY 30 capsule 5   No current facility-administered medications on file prior to visit.     Past Surgical History:  Procedure  Laterality Date  . LUMBAR DISC SURGERY     L5-S1    No Known Allergies  Social History   Social History  . Marital status: Married    Spouse name: N/A  . Number of children: 1  . Years of education: N/A   Occupational History  . 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 Smoking status: Current Every Day Smoker    Packs/day: 0.50  . Smokeless tobacco: Never Used  . Alcohol use Yes     Comment: 4 drinks daily  . Drug use: No  . Sexual activity: Yes     Comment: ptp states she cannot be pregnant, but not using anything to not get prenant   Other Topics Concern  . Not on file   Social History Narrative  . No narrative on file    Family History  Problem Relation Age of Onset  . Breast cancer Mother   . Breast cancer Sister   . Heart disease Brother   . Colon cancer Neg Hx   . Esophageal cancer Neg Hx   . Rectal cancer Neg Hx   . Stomach cancer Neg Hx     BP 121/80   Pulse 66   Ht 5\' 5"  (1.651 m)   Wt 115 lb (52.2 kg)   BMI 19.14 kg/m   Review of Systems: See HPI above.     Objective:  Physical Exam:  Gen: NAD, comfortable in exam room  Right elbow: No gross deformity, swelling, bruising. No TTP lateral epicondyle.  TTP brachioradialis distal upper arm.  No other tenderness. FROM elbow with pain on radial deviation of wrist and elbow flexion. Collateral ligaments intact. NVI distally.  Left elbow: FROM without pain.   Assessment & Plan:  1. Right lateral epicondylitis - Improved.  Primary issue now is brachioradialis strain/tendinopathy.  Add strengthening exercises for this and continue others.  Declined repeat PT, nitro for now.  Aleve, icing if needed.  F/u in 2-3 months.

## 2017-05-25 NOTE — Patient Instructions (Signed)
The tennis elbow portion is improved - your main problem now is the muscle just above this, the brachioradialis. Continue home exercises but add hammer curls and wrist curls as I showed you 3 sets of 10 once a day. Take aleve only if needed, icing only if needed. Follow up with me in 2-3 months for reevaluation.

## 2017-07-21 ENCOUNTER — Other Ambulatory Visit: Payer: Self-pay | Admitting: Family Medicine

## 2017-08-01 ENCOUNTER — Other Ambulatory Visit: Payer: Self-pay | Admitting: Family

## 2017-08-01 NOTE — Telephone Encounter (Signed)
Forward to PCP/[facility]ks

## 2017-08-01 NOTE — Telephone Encounter (Signed)
Last seen 01/17/17  Dr Oswaldo Done

## 2017-08-06 ENCOUNTER — Emergency Department (HOSPITAL_COMMUNITY)
Admission: EM | Admit: 2017-08-06 | Discharge: 2017-08-06 | Disposition: A | Discharge status: Home / Self Care | Attending: Emergency Medicine | Admitting: Emergency Medicine

## 2017-08-06 ENCOUNTER — Emergency Department (HOSPITAL_COMMUNITY)

## 2017-08-06 ENCOUNTER — Encounter (HOSPITAL_COMMUNITY): Payer: Self-pay | Admitting: Emergency Medicine

## 2017-08-06 DIAGNOSIS — S81832A Puncture wound without foreign body, left lower leg, initial encounter: Secondary | ICD-10-CM | POA: Insufficient documentation

## 2017-08-06 DIAGNOSIS — F17210 Nicotine dependence, cigarettes, uncomplicated: Secondary | ICD-10-CM | POA: Insufficient documentation

## 2017-08-06 DIAGNOSIS — Y929 Unspecified place or not applicable: Secondary | ICD-10-CM | POA: Insufficient documentation

## 2017-08-06 DIAGNOSIS — Y990 Civilian activity done for income or pay: Secondary | ICD-10-CM | POA: Diagnosis not present

## 2017-08-06 DIAGNOSIS — W540XXA Bitten by dog, initial encounter: Secondary | ICD-10-CM | POA: Diagnosis not present

## 2017-08-06 DIAGNOSIS — Y939 Activity, unspecified: Secondary | ICD-10-CM | POA: Diagnosis not present

## 2017-08-06 DIAGNOSIS — Z23 Encounter for immunization: Secondary | ICD-10-CM | POA: Insufficient documentation

## 2017-08-06 MED ORDER — AMOXICILLIN-POT CLAVULANATE 875-125 MG PO TABS: 1.0000 | ORAL_TABLET | Freq: Two times a day (BID) | ORAL | 0 refills | Status: DC

## 2017-08-06 MED ORDER — BACITRACIN ZINC 500 UNIT/GM EX OINT
1.0000 "application " | TOPICAL_OINTMENT | Freq: Two times a day (BID) | CUTANEOUS | Status: DC
  Administered 2017-08-06: 1 via TOPICAL
  Filled 2017-08-06: qty 1.8

## 2017-08-06 MED ORDER — IBUPROFEN 600 MG PO TABS: 600.0000 mg | ORAL_TABLET | Freq: Four times a day (QID) | ORAL | 0 refills | Status: DC | PRN

## 2017-08-06 MED ORDER — TETANUS-DIPHTH-ACELL PERTUSSIS 5-2.5-18.5 LF-MCG/0.5 IM SUSP
0.5000 mL | Freq: Once | INTRAMUSCULAR | Status: AC
  Administered 2017-08-06: 0.5 mL via INTRAMUSCULAR
  Filled 2017-08-06: qty 0.5

## 2017-08-06 MED ORDER — IBUPROFEN 400 MG PO TABS
400.0000 mg | ORAL_TABLET | Freq: Once | ORAL | Status: AC
  Administered 2017-08-06: 400 mg via ORAL
  Filled 2017-08-06: qty 1

## 2017-08-06 MED ORDER — AMOXICILLIN-POT CLAVULANATE 875-125 MG PO TABS
1.0000 | ORAL_TABLET | Freq: Once | ORAL | Status: AC
  Administered 2017-08-06: 1 via ORAL
  Filled 2017-08-06: qty 1

## 2017-08-06 NOTE — ED Provider Notes (Signed)
[facility]-EMER[facility]ENCY DEPT Provider Note   CSN: [id number] Arrival date & time: 08/06/17  1425     History   Chief Complaint Chief Complaint  Patient presents with  . Animal Bite    HPI Sarah Fox is a 51 y.o. female.  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 She has pain and a small amount of bruising to the area where the dog bit her. This occurred just prior to arrival, symptoms are constant, mild, worse with palpation.  Past Medical History:  Diagnosis Date  . Allergy   . GERD (gastroesophageal reflux disease)     Patient Active Problem List   Diagnosis Date Noted  . Right lateral epicondylitis 01/24/2017  . Ulcer-like dyspepsia 04/04/2012  . Tobacco use 04/04/2012    Past Surgical History:  Procedure Laterality Date  . LUMBAR DISC SURGERY     L5-S1    OB History    No data available       Home Medications    Prior to Admission medications   Medication Sig Start Date End Date Taking? Authorizing Provider  amoxicillin-clavulanate (AUGMENTIN) 875-125 MG tablet Take 1 tablet by mouth every 12 (twelve) hours. 08/06/17   Eber Hong, MD  ibuprofen (ADVIL,MOTRIN) 600 MG tablet Take 1 tablet (600 mg total) by mouth every 6 (six) hours as needed. 08/06/17   Eber Hong, MD  naproxen (NAPROSYN) 500 MG tablet Take 1 tablet (500 mg total) by mouth 2 (two) times daily with a meal. Patient not taking: Reported on 01/17/2017 12/13/16   Junie Spencer, FNP  nitroGLYCERIN (NITRODUR - DOSED IN MG/24 HR) 0.2 mg/hr patch Apply 1/4th patch to affected elbow, change daily 01/19/17   Hudnall, Azucena Fallen, MD  omeprazole (PRILOSEC) 40 MG capsule TAKE 1 CAPSULE EVERY DAY 08/01/17   Junie Spencer,  FNP    Family History Family History  Problem Relation Age of Onset  . Breast cancer Mother   . Breast cancer Sister   . Heart disease Brother   . Colon cancer Neg Hx   . Esophageal cancer Neg Hx   . Rectal cancer Neg Hx   . Stomach cancer Neg Hx     Social History Social History  Substance Use Topics  . Smoking status: Current Every Day Smoker    Packs/day: 0.50  . Smokeless tobacco: Never Used  . Alcohol use Yes     Comment: 4 drinks daily     Allergies   Patient has no known allergies.   Review of Systems Review of Systems  Skin: Positive for wound.  Neurological: Negative for numbness.     Physical Exam Updated Vital Signs BP (!) 145/95 (BP Location: Right Arm)   Pulse 81   Temp 98.2 F (36.8 C) (Oral)   Resp (!) 100   Ht 5\' 5"  (1.651 m)   Wt 52.2 kg (115 lb)   LMP 07/27/2017   SpO2 100%   BMI 19.14 kg/m   Physical Exam  Constitutional: She appears well-developed and well-nourished.  HENT:  Head: Normocephalic and atraumatic.  Eyes: Conjunctivae are normal. Right eye exhibits no discharge. Left eye exhibits no discharge.  Pulmonary/Chest: Effort normal. No respiratory  distress.  Musculoskeletal: She exhibits tenderness.  Neurological: She is alert. Coordination normal.  Skin: Skin is warm and dry. Rash noted. She is not diaphoretic. No erythema.  4 small puncture wounds, small amount of subcutaneous bruising, no obvious foreign body, mildly tender in this area  Psychiatric: She has a normal mood and affect.  Nursing note and vitals reviewed.    ED Treatments / Results  Labs (all labs ordered are listed, but only abnormal results are displayed) Labs Reviewed - No data to display   Radiology No results found.  Procedures Procedures (including critical care time)  Medications Ordered in ED Medications  bacitracin ointment 1 application (1 application Topical Given 08/06/17 1649)  Tdap (BOOSTRIX) injection 0.5 mL (0.5 mLs Intramuscular  Given 08/06/17 1646)  ibuprofen (ADVIL,MOTRIN) tablet 400 mg (400 mg Oral Given 08/06/17 1645)  amoxicillin-clavulanate (AUGMENTIN) 875-125 MG per tablet 1 tablet (1 tablet Oral Given 08/06/17 1645)     Initial Impression / Assessment and Plan / ED Course  I have reviewed the triage vital signs and the nursing notes.  Pertinent labs & imaging results that were available during my care of the patient were reviewed by me and considered in my medical decision making (see chart for details).     Dog bite, look for foreign body with x-ray, update tetanus, Augmentin, animal control involved, wound care, bacitracin, patient expressed understanding.  No foreign body seen on x-ray, patient expressed understanding to the indications for return and states she will follow up with animal control.  Final Clinical Impressions(s) / ED Diagnoses   Final diagnoses:  Dog bite, initial encounter    New Prescriptions New Prescriptions   AMOXICILLIN-CLAVULANATE (AUGMENTIN) 875-125 MG TABLET    Take 1 tablet by mouth every 12 (twelve) hours.   IBUPROFEN (ADVIL,MOTRIN) 600 MG TABLET    Take 1 tablet (600 mg total) by mouth every 6 (six) hours as needed.     Eber Hong, MD 08/06/17 [phone]

## 2017-08-06 NOTE — ED Triage Notes (Signed)
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

## 2017-08-06 NOTE — ED Notes (Addendum)
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

## 2017-08-10 ENCOUNTER — Encounter: Payer: Self-pay | Admitting: Family Medicine

## 2017-08-10 ENCOUNTER — Ambulatory Visit (INDEPENDENT_AMBULATORY_CARE_PROVIDER_SITE_OTHER): Payer: Federal, State, Local not specified - PPO | Admitting: Family Medicine

## 2017-08-10 DIAGNOSIS — M7711 Lateral epicondylitis, right elbow: Secondary | ICD-10-CM | POA: Diagnosis not present

## 2017-08-10 NOTE — Patient Instructions (Signed)
Continue your home exercises most days of the week. Follow up on Friday to do an injection (no charge visit). Take aleve only if needed, icing only if needed.

## 2017-08-11 NOTE — Assessment & Plan Note (Signed)
She has improved but still struggles with pain especially at work.  Has tried physical therapy, home exercise, brace, nitro patches, NSAIDs, icing.  She will return later this week to try injection.  Aleve, icing if needed, continue home exercises and brace.  Consider ortho referral if not improving.

## 2017-08-11 NOTE — Progress Notes (Signed)
PCP: Rudi Heap MD Consultation requested by: Rex Kras MD  Subjective:   HPI: Patient is a 51 y.o. female here for right elbow pain.  1/24: Patient reports she's had 3 months of lateral right elbow pain. No injury or trauma. No swelling. Pain is constant, sharp. Pain level 5/10 but up to 8/10 at worst. Bothers her with wrist motions. 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 Tried prednisone, naproxen, ibuprofen with transient relief. Right handed.  3/7: Patient returns with mild improvement from last visit. Pain level 3/10, still sharp - sometimes gets palmar feeling like this hand is on fire. Worse lifting packages. Is doing home exercises, using nitro patches. Had headache one day with the patches. No skin changes, numbness.  4/18: Patient reports she is doing well. Pain level is 1/10 now, dull. Doing well with physical therapy and home exercises. Continuing to use nitro patches also. Able to lift coffee cup now and strength improving. No skin changes, numbness.  5/30: Patient returns with right lateral elbow pain. Some worse since last visit. Pain level 2/10 at rest, up to 4/10 at worst. Done with physical therapy. Doing some home exercises. Worse when sleeping. No skin changes, numbness.  8/15: Patient reports she's still experiencing some pain right lateral elbow. Pain level 1/10 currently, dull. Worse when at work and afterwards. Doing home exercises some. Still wearing brace at work. Does feel in middle of night, improves with a little activity but worsens with a lot of activity. No skin changes, numbness.  Past Medical History:  Diagnosis Date  . Allergy   . GERD (gastroesophageal reflux disease)     Current Outpatient Prescriptions on File Prior to Visit  Medication Sig Dispense Refill  . amoxicillin-clavulanate (AUGMENTIN) 875-125 MG tablet Take 1 tablet by mouth every 12 (twelve) hours. 14 tablet 0  . ibuprofen (ADVIL,MOTRIN)  600 MG tablet Take 1 tablet (600 mg total) by mouth every 6 (six) hours as needed. 30 tablet 0  . naproxen (NAPROSYN) 500 MG tablet Take 1 tablet (500 mg total) by mouth 2 (two) times daily with a meal. (Patient not taking: Reported on 01/17/2017) 30 tablet 0  . nitroGLYCERIN (NITRODUR - DOSED IN MG/24 HR) 0.2 mg/hr patch Apply 1/4th patch to affected elbow, change daily 30 patch 1  . omeprazole (PRILOSEC) 40 MG capsule TAKE 1 CAPSULE EVERY DAY 30 capsule 0   No current facility-administered medications on file prior to visit.     Past Surgical History:  Procedure Laterality Date  . LUMBAR DISC SURGERY     L5-S1    No Known Allergies  Social History   Social History  . Marital status: Married    Spouse name: N/A  . Number of children: 1  . Years of education: N/A   Occupational History  . 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 Smoking status: Current Every Day Smoker    Packs/day: 0.50  . Smokeless tobacco: Never Used  . Alcohol use Yes     Comment: 4 drinks daily  . Drug use: No  . Sexual activity: Yes     Comment: ptp states she cannot be pregnant, but not using anything to not get prenant   Other Topics Concern  . Not on file   Social History Narrative  . No narrative on file    Family History  Problem Relation Age of Onset  . Breast cancer Mother   . Breast cancer Sister   .  Heart disease Brother   . Colon cancer Neg Hx   . Esophageal cancer Neg Hx   . Rectal cancer Neg Hx   . Stomach cancer Neg Hx     BP 126/71   Pulse 78   Ht 5\' 5"  (1.651 m)   Wt 120 lb (54.4 kg)   LMP 07/27/2017   BMI 19.97 kg/m   Review of Systems: See HPI above.     Objective:  Physical Exam:  Gen: NAD, comfortable in exam room  Right elbow: No gross deformity, swelling, bruising. Mild TTP lateral epicondyle, just distal to this, and proximal brachioradialis distal upper arm.  No other tenderness. FROM elbow with pain on wrist extension and 3rd  digit extension, radial deviation. Collateral ligaments intact. NVI distally.  Left elbow: FROM without pain.   Assessment & Plan:  1. Right lateral epicondylitis - She has improved but still struggles with pain especially at work.  Has tried physical therapy, home exercise, brace, nitro patches, NSAIDs, icing.  She will return later this week to try injection.  Aleve, icing if needed, continue home exercises and brace.  Consider ortho referral if not improving.

## 2017-08-12 ENCOUNTER — Encounter: Payer: Self-pay | Admitting: Family Medicine

## 2017-08-12 ENCOUNTER — Ambulatory Visit (INDEPENDENT_AMBULATORY_CARE_PROVIDER_SITE_OTHER): Payer: Federal, State, Local not specified - PPO | Admitting: Family Medicine

## 2017-08-12 DIAGNOSIS — M7711 Lateral epicondylitis, right elbow: Secondary | ICD-10-CM

## 2017-08-12 MED ORDER — METHYLPREDNISOLONE ACETATE 40 MG/ML IJ SUSP
40.0000 mg | Freq: Once | INTRAMUSCULAR | Status: AC
  Administered 2017-08-12: 40 mg via INTRA_ARTICULAR

## 2017-08-12 NOTE — Assessment & Plan Note (Signed)
Injection performed today.  Has tried physical therapy, home exercise, brace, nitro patches, NSAIDs, icing.  Aleve, icing if needed, continue home exercises and brace.  Consider ortho referral if not improving.  F/u in 6-12 weeks.  After informed written consent patient was seated on exam table.  Area overlying just distal to right lateral epicondyle prepped with alcohol swab then injected with 2:1 bupivicaine: depomedrol with multiple needle fenestrations.  Patient tolerated procedure well without immediate complications.

## 2017-08-12 NOTE — Progress Notes (Signed)
PCP: Rudi Heap MD Consultation requested by: Rex Kras MD  Subjective:   HPI: Patient is a 51 y.o. female here for right elbow pain.  1/24: Patient reports she's had 3 months of lateral right elbow pain. No injury or trauma. No swelling. Pain is constant, sharp. Pain level 5/10 but up to 8/10 at worst. Bothers her with wrist motions. 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 Tried prednisone, naproxen, ibuprofen with transient relief. Right handed.  3/7: Patient returns with mild improvement from last visit. Pain level 3/10, still sharp - sometimes gets palmar feeling like this hand is on fire. Worse lifting packages. Is doing home exercises, using nitro patches. Had headache one day with the patches. No skin changes, numbness.  4/18: Patient reports she is doing well. Pain level is 1/10 now, dull. Doing well with physical therapy and home exercises. Continuing to use nitro patches also. Able to lift coffee cup now and strength improving. No skin changes, numbness.  5/30: Patient returns with right lateral elbow pain. Some worse since last visit. Pain level 2/10 at rest, up to 4/10 at worst. Done with physical therapy. Doing some home exercises. Worse when sleeping. No skin changes, numbness.  8/15: Patient reports she's still experiencing some pain right lateral elbow. Pain level 1/10 currently, dull. Worse when at work and afterwards. Doing home exercises some. Still wearing brace at work. Does feel in middle of night, improves with a little activity but worsens with a lot of activity. No skin changes, numbness.  8/17: Patient returns for elbow injection  Past Medical History:  Diagnosis Date  . Allergy   . GERD (gastroesophageal reflux disease)     Current Outpatient Prescriptions on File Prior to Visit  Medication Sig Dispense Refill  . amoxicillin-clavulanate (AUGMENTIN) 875-125 MG tablet Take 1 tablet by mouth every 12 (twelve)  hours. 14 tablet 0  . ibuprofen (ADVIL,MOTRIN) 600 MG tablet Take 1 tablet (600 mg total) by mouth every 6 (six) hours as needed. 30 tablet 0  . naproxen (NAPROSYN) 500 MG tablet Take 1 tablet (500 mg total) by mouth 2 (two) times daily with a meal. (Patient not taking: Reported on 01/17/2017) 30 tablet 0  . nitroGLYCERIN (NITRODUR - DOSED IN MG/24 HR) 0.2 mg/hr patch Apply 1/4th patch to affected elbow, change daily 30 patch 1  . omeprazole (PRILOSEC) 40 MG capsule TAKE 1 CAPSULE EVERY DAY 30 capsule 0   No current facility-administered medications on file prior to visit.     Past Surgical History:  Procedure Laterality Date  . LUMBAR DISC SURGERY     L5-S1    No Known Allergies  Social History   Social History  . Marital status: Married    Spouse name: N/A  . Number of children: 1  . Years of education: N/A   Occupational History  . 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 Smoking status: Current Every Day Smoker    Packs/day: 0.50  . Smokeless tobacco: Never Used  . Alcohol use Yes     Comment: 4 drinks daily  . Drug use: No  . Sexual activity: Yes     Comment: ptp states she cannot be pregnant, but not using anything to not get prenant   Other Topics Concern  . Not on file   Social History Narrative  . No narrative on file    Family History  Problem Relation Age of Onset  . Breast cancer Mother   .  Breast cancer Sister   . Heart disease Brother   . Colon cancer Neg Hx   . Esophageal cancer Neg Hx   . Rectal cancer Neg Hx   . Stomach cancer Neg Hx     BP 119/74   Ht 5\' 5"  (1.651 m)   Wt 120 lb (54.4 kg)   LMP 07/27/2017   BMI 19.97 kg/m   Review of Systems: See HPI above.     Objective:  Physical Exam:  Gen: NAD, comfortable in exam room  Exam not repeated today. Right elbow: No gross deformity, swelling, bruising. Mild TTP lateral epicondyle, just distal to this, and proximal brachioradialis distal upper arm.  No other  tenderness. FROM elbow with pain on wrist extension and 3rd digit extension, radial deviation. Collateral ligaments intact. NVI distally.  Left elbow: FROM without pain.   Assessment & Plan:  1. Right lateral epicondylitis - Injection performed today.  Has tried physical therapy, home exercise, brace, nitro patches, NSAIDs, icing.  Aleve, icing if needed, continue home exercises and brace.  Consider ortho referral if not improving.  F/u in 6-12 weeks.  After informed written consent patient was seated on exam table.  Area overlying just distal to right lateral epicondyle prepped with alcohol swab then injected with 2:1 bupivicaine: depomedrol with multiple needle fenestrations.  Patient tolerated procedure well without immediate complications.

## 2017-08-30 ENCOUNTER — Other Ambulatory Visit: Payer: Self-pay | Admitting: Family

## 2017-09-05 ENCOUNTER — Other Ambulatory Visit: Payer: Self-pay | Admitting: Family

## 2017-09-05 NOTE — Telephone Encounter (Signed)
What is the name of the medication? Omeprazole 40 mg  Have you contacted your pharmacy to request a refill? YES  Which pharmacy would you like this sent to? [facility] in [place]   Patient notified that their request is being sent to the clinical staff for review and that they should receive a call once it is complete. If they do not receive a call within 24 hours they can check with their pharmacy or our office.

## 2017-09-05 NOTE — Telephone Encounter (Signed)
Pt will check with [facility] again. Informed that we sent RF in on 08/30/17

## 2017-09-10 ENCOUNTER — Emergency Department (HOSPITAL_COMMUNITY): Payer: Federal, State, Local not specified - PPO

## 2017-09-10 ENCOUNTER — Encounter (HOSPITAL_COMMUNITY): Payer: Self-pay | Admitting: Emergency Medicine

## 2017-09-10 ENCOUNTER — Emergency Department (HOSPITAL_COMMUNITY)
Admission: EM | Admit: 2017-09-10 | Discharge: 2017-09-10 | Disposition: A | Discharge status: Home / Self Care | Payer: Federal, State, Local not specified - PPO | Attending: Emergency Medicine | Admitting: Emergency Medicine

## 2017-09-10 DIAGNOSIS — F17200 Nicotine dependence, unspecified, uncomplicated: Secondary | ICD-10-CM | POA: Insufficient documentation

## 2017-09-10 DIAGNOSIS — Y92410 Unspecified street and highway as the place of occurrence of the external cause: Secondary | ICD-10-CM | POA: Diagnosis not present

## 2017-09-10 DIAGNOSIS — S199XXA Unspecified injury of neck, initial encounter: Secondary | ICD-10-CM | POA: Diagnosis not present

## 2017-09-10 DIAGNOSIS — S0990XA Unspecified injury of head, initial encounter: Secondary | ICD-10-CM

## 2017-09-10 DIAGNOSIS — M25512 Pain in left shoulder: Secondary | ICD-10-CM

## 2017-09-10 DIAGNOSIS — M25511 Pain in right shoulder: Secondary | ICD-10-CM | POA: Diagnosis not present

## 2017-09-10 DIAGNOSIS — Y999 Unspecified external cause status: Secondary | ICD-10-CM | POA: Diagnosis not present

## 2017-09-10 DIAGNOSIS — M5489 Other dorsalgia: Secondary | ICD-10-CM | POA: Diagnosis not present

## 2017-09-10 DIAGNOSIS — M542 Cervicalgia: Secondary | ICD-10-CM

## 2017-09-10 DIAGNOSIS — S098XXA Other specified injuries of head, initial encounter: Secondary | ICD-10-CM | POA: Diagnosis not present

## 2017-09-10 DIAGNOSIS — M436 Torticollis: Secondary | ICD-10-CM | POA: Diagnosis not present

## 2017-09-10 DIAGNOSIS — Y939 Activity, unspecified: Secondary | ICD-10-CM | POA: Diagnosis not present

## 2017-09-10 DIAGNOSIS — S4992XA Unspecified injury of left shoulder and upper arm, initial encounter: Secondary | ICD-10-CM | POA: Diagnosis not present

## 2017-09-10 MED ORDER — METHOCARBAMOL 500 MG PO TABS: 500.0000 mg | ORAL_TABLET | Freq: Two times a day (BID) | ORAL | 0 refills | Status: DC

## 2017-09-10 MED ORDER — IBUPROFEN 800 MG PO TABS: 800.0000 mg | ORAL_TABLET | Freq: Three times a day (TID) | ORAL | 0 refills | Status: DC | PRN
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
## 2017-09-10 NOTE — Discharge Instructions (Signed)
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
## 2017-09-13 ENCOUNTER — Ambulatory Visit: Payer: Federal, State, Local not specified - PPO | Admitting: Family

## 2017-09-16 ENCOUNTER — Encounter: Payer: Self-pay | Admitting: Family

## 2017-09-16 ENCOUNTER — Ambulatory Visit (INDEPENDENT_AMBULATORY_CARE_PROVIDER_SITE_OTHER): Payer: Federal, State, Local not specified - PPO | Admitting: Family

## 2017-09-16 DIAGNOSIS — M542 Cervicalgia: Secondary | ICD-10-CM

## 2017-09-16 DIAGNOSIS — M546 Pain in thoracic spine: Secondary | ICD-10-CM | POA: Diagnosis not present
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
## 2017-09-28 ENCOUNTER — Encounter: Payer: Self-pay | Admitting: Family Medicine

## 2017-09-28 ENCOUNTER — Ambulatory Visit (INDEPENDENT_AMBULATORY_CARE_PROVIDER_SITE_OTHER): Payer: Federal, State, Local not specified - PPO | Admitting: Family Medicine

## 2017-09-28 DIAGNOSIS — M25521 Pain in right elbow: Secondary | ICD-10-CM | POA: Diagnosis not present

## 2017-09-28 DIAGNOSIS — M25512 Pain in left shoulder: Secondary | ICD-10-CM

## 2017-09-28 NOTE — Patient Instructions (Signed)
I think your elbow is ok - you have a little forearm strain but nowhere near what you were dealing with in the elbow that required a shot. Ibuprofen 600mg  three times a day with food. Icing if needed. I'd expect this to be gone over another 2-4 weeks. Follow up with me in 4 weeks.  Your shoulder pain is due to rotator cuff strain with impingement. Do the theraband exercises 3 sets of 10 once a day. Can consider physical therapy, nitro patches, injection if not improving as expected.

## 2017-09-29 DIAGNOSIS — M25512 Pain in left shoulder: Secondary | ICD-10-CM | POA: Insufficient documentation

## 2017-09-29 NOTE — Assessment & Plan Note (Signed)
2/2 supraspinatus strain and impingement.  Start home exercise program which was reviewed today.  Consider PT, nitro patches, injection if not improving.

## 2017-09-29 NOTE — Assessment & Plan Note (Signed)
her epicondylitis is still improved.  Current pain is due to forearm strain.  Ibuprofen, icing if needed.  Expect another 2-4 weeks for this to resolve.  F/u in 4 weeks.
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
## 2017-10-02 ENCOUNTER — Other Ambulatory Visit: Payer: Self-pay | Admitting: Family

## 2017-10-03 ENCOUNTER — Ambulatory Visit: Payer: Federal, State, Local not specified - PPO | Admitting: Family Medicine

## 2017-10-26 ENCOUNTER — Ambulatory Visit (INDEPENDENT_AMBULATORY_CARE_PROVIDER_SITE_OTHER): Payer: Federal, State, Local not specified - PPO | Admitting: Family Medicine

## 2017-10-26 ENCOUNTER — Encounter: Payer: Self-pay | Admitting: Family Medicine

## 2017-10-26 VITALS — BP 126/75 | HR 88 | Ht 65.0 in | Wt 120.0 lb

## 2017-10-26 DIAGNOSIS — M25521 Pain in right elbow: Secondary | ICD-10-CM | POA: Diagnosis not present

## 2017-10-26 DIAGNOSIS — M25512 Pain in left shoulder: Secondary | ICD-10-CM

## 2017-10-26 NOTE — Patient Instructions (Signed)
Follow up with me later this week (or next week) to repeat the injection of your elbow. Continue home exercises, ibuprofen, icing as you have been.  Your shoulder rotator cuff strain has improved but you still have impingement. Start physical therapy for this. Continue home exercises with theraband in the meantime. Can consider nitro patches, injection if not improving as expected.

## 2017-10-27 DIAGNOSIS — I469 Cardiac arrest, cause unspecified: Secondary | ICD-10-CM | POA: Insufficient documentation

## 2017-10-27 DIAGNOSIS — I219 Acute myocardial infarction, unspecified: Secondary | ICD-10-CM

## 2017-10-27 DIAGNOSIS — I255 Ischemic cardiomyopathy: Secondary | ICD-10-CM

## 2017-10-27 DIAGNOSIS — I4901 Ventricular fibrillation: Secondary | ICD-10-CM

## 2017-10-27 HISTORY — DX: Cardiac arrest, cause unspecified: I46.9

## 2017-10-27 HISTORY — DX: Acute myocardial infarction, unspecified: I21.9

## 2017-10-27 HISTORY — DX: Ventricular fibrillation: I49.01

## 2017-10-27 HISTORY — DX: Ischemic cardiomyopathy: I25.5

## 2017-10-27 NOTE — Assessment & Plan Note (Signed)
2/2 lateral epicondylitis.  Forearm strain has improved.  Continue home exercises, ibuprofen, icing.  Will return to repeat injection.  Consider ortho referral if not improving.  F/u 5-6 weeks after this.

## 2017-10-27 NOTE — Assessment & Plan Note (Signed)
Supraspinatus strain improved.  Still with impingement.  Start physical therapy.  Continue home exercises.  Consider nitro patches, injection if not improving.  F/u 5-6 weeks.
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
## 2017-10-31 ENCOUNTER — Ambulatory Visit (INDEPENDENT_AMBULATORY_CARE_PROVIDER_SITE_OTHER): Payer: Federal, State, Local not specified - PPO | Admitting: Family Medicine

## 2017-10-31 ENCOUNTER — Other Ambulatory Visit: Payer: Self-pay | Admitting: *Deleted

## 2017-10-31 ENCOUNTER — Encounter: Payer: Self-pay | Admitting: Family Medicine

## 2017-10-31 DIAGNOSIS — M25521 Pain in right elbow: Secondary | ICD-10-CM

## 2017-10-31 MED ORDER — OMEPRAZOLE 40 MG PO CPDR: DELAYED_RELEASE_CAPSULE | ORAL | 0 refills | Status: DC

## 2017-10-31 MED ORDER — METHYLPREDNISOLONE ACETATE 40 MG/ML IJ SUSP
40.0000 mg | Freq: Once | INTRAMUSCULAR | Status: AC
  Administered 2017-10-31: 40 mg via INTRA_ARTICULAR

## 2017-11-01 ENCOUNTER — Encounter: Payer: Self-pay | Admitting: Family Medicine

## 2017-11-01 NOTE — Assessment & Plan Note (Signed)
2/2 lateral epicondylitis.  Forearm strain has improved.  Injection given today.  Continue home exercises, ibuprofen, icing.  Consider ortho referral if not improving.  F/u 5-6 weeks.  After informed written consent timeout was performed.  Patient was seated on exam table.  Area of maximal pain just distal to right lateral epicondyle prepped with alcohol swab.  Then using multiple needle fenestrations patient was injected with 2:1 bupivicaine: depomedrol into right common extensor tendon.  Patient tolerated procedure well without immediate complications.
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
## 2017-11-02 ENCOUNTER — Encounter: Payer: Self-pay | Admitting: Family Medicine

## 2017-11-02 ENCOUNTER — Telehealth: Payer: Self-pay | Admitting: Family Medicine

## 2017-11-02 MED ORDER — CEPHALEXIN 500 MG PO CAPS: 500.0000 mg | ORAL_CAPSULE | Freq: Four times a day (QID) | ORAL | 0 refills | Status: DC

## 2017-11-02 NOTE — Progress Notes (Signed)
Patient came in today with pain, swelling since the injection lateral elbow.  She has some mild redness and localized swelling in the area on exam.  Temperature was 97.9.  Area tender to the touch.  Ultrasound showed no evidence abscess or elbow effusion.  Consistent with hematoma/bruising from multiple needle fenestrations of the injection but advised we should also cover for possible developing cellulitis with keflex - plan to see her back on Monday but advised to call us sooner if she's worsening or develops a fever.

## 2017-11-02 NOTE — Telephone Encounter (Signed)
Patient calling with concerns. States Monday evening her right elbow began to swell and is still swollen today. Patient states she has had pain in her right arm since Monday evening at a level 12 and has stayed out of work today due to the pain   Wants to know if this is to be expected after an injection

## 2017-11-02 NOTE — Telephone Encounter (Signed)
That's not unusual after the shot given we have to do multiple needle passes and the shot doesn't kick in for a few days afterwards.  Having said this I usually like to see them just to make sure they're not getting an infection or anything else unusual as a result of the shot - if she can make it in here some time today.

## 2017-11-02 NOTE — Telephone Encounter (Signed)
Patient was informed. She plans on stopping by the office this afternoon

## 2017-11-03 ENCOUNTER — Telehealth: Payer: Self-pay | Admitting: Family Medicine

## 2017-11-03 MED ORDER — CEPHALEXIN 500 MG PO CAPS: 500.0000 mg | ORAL_CAPSULE | Freq: Four times a day (QID) | ORAL | 0 refills | Status: DC

## 2017-11-03 NOTE — Telephone Encounter (Signed)
Patient was informed that the script was resent and instructed to call us back if there are any problems.

## 2017-11-03 NOTE — Telephone Encounter (Signed)
I have confirmation that it went through yesterday and the pharmacy is correct.  I will send it again.  Ask her to call us again if they say they still don't have it and we will call them with the script instead of sending it through the computer.

## 2017-11-03 NOTE — Telephone Encounter (Signed)
Patient called regarding cephALEXin [facility]) Rx. States she went by the pharmacy yesterday and called around 8pm and they do not have the prescription.   Verified pharmacy with patient: [facility] on [address]

## 2017-11-07 ENCOUNTER — Ambulatory Visit: Payer: Federal, State, Local not specified - PPO | Admitting: Family Medicine

## 2017-11-07 ENCOUNTER — Encounter: Payer: Self-pay | Admitting: Physical Therapy

## 2017-11-07 ENCOUNTER — Encounter: Payer: Self-pay | Admitting: Family Medicine

## 2017-11-07 ENCOUNTER — Other Ambulatory Visit: Payer: Self-pay

## 2017-11-07 ENCOUNTER — Ambulatory Visit: Payer: Federal, State, Local not specified - PPO | Attending: Family Medicine | Admitting: Physical Therapy

## 2017-11-07 DIAGNOSIS — M25612 Stiffness of left shoulder, not elsewhere classified: Secondary | ICD-10-CM | POA: Insufficient documentation

## 2017-11-07 DIAGNOSIS — M6281 Muscle weakness (generalized): Secondary | ICD-10-CM | POA: Diagnosis not present

## 2017-11-07 DIAGNOSIS — M25512 Pain in left shoulder: Secondary | ICD-10-CM | POA: Diagnosis not present

## 2017-11-07 DIAGNOSIS — M25521 Pain in right elbow: Secondary | ICD-10-CM

## 2017-11-07 NOTE — Assessment & Plan Note (Signed)
2/2 lateral epicondylitis.  Pain has resolved.  The redness, severe pain following the injection has resolved as well.  Encouraged to finish out the keflex as directed.  Keep follow-up in 4 weeks.  Icing, ibuprofen only if needed.  Consider ortho referral if this recurs.
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
## 2017-11-11 ENCOUNTER — Ambulatory Visit: Payer: Federal, State, Local not specified - PPO | Admitting: *Deleted

## 2017-11-11 DIAGNOSIS — M6281 Muscle weakness (generalized): Secondary | ICD-10-CM | POA: Diagnosis not present

## 2017-11-11 DIAGNOSIS — M25512 Pain in left shoulder: Secondary | ICD-10-CM

## 2017-11-11 DIAGNOSIS — M25612 Stiffness of left shoulder, not elsewhere classified: Secondary | ICD-10-CM | POA: Diagnosis not present
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
## 2017-11-14 ENCOUNTER — Ambulatory Visit: Payer: Federal, State, Local not specified - PPO | Admitting: Physical Therapy

## 2017-11-14 ENCOUNTER — Encounter: Payer: Self-pay | Admitting: Physical Therapy

## 2017-11-14 DIAGNOSIS — M25512 Pain in left shoulder: Secondary | ICD-10-CM | POA: Diagnosis not present

## 2017-11-14 DIAGNOSIS — M25612 Stiffness of left shoulder, not elsewhere classified: Secondary | ICD-10-CM | POA: Diagnosis not present

## 2017-11-14 DIAGNOSIS — M6281 Muscle weakness (generalized): Secondary | ICD-10-CM

## 2017-11-14 NOTE — Therapy (Signed)
[facility] [address] [place], [place], [address] Phone: [phone]   Fax:  [phone]  Physical Therapy Treatment  Patient Details  Name: Sarah Fox MRN: [id number] Date of Birth: 12/19/1966 Referring Provider: Dr. Norton BlizzardShane Hudnall   Encounter Date: 11/14/2017  PT End of Session - 11/14/17 1104    Visit Number  3    Number of Visits  12    Date for PT Re-Evaluation  12/19/17    PT Start Time  0900    PT Stop Time  0953    PT Time Calculation (min)  53 min    Activity Tolerance  Patient tolerated treatment well    Behavior During Therapy  [facility] for tasks assessed/performed       Past Medical History:  Diagnosis Date  . Allergy   . GERD (gastroesophageal reflux disease)     Past Surgical History:  Procedure Laterality Date  . LUMBAR DISC SURGERY     L5-S1    There were no vitals filed for this visit.  Subjective Assessment - 11/14/17 0914    Subjective  No new complaints.    Pain Score  2     Pain Location  Shoulder    Pain Orientation  Left    Pain Descriptors / Indicators  Sore    Pain Onset  More than a month ago                      [facility]/Exercise - 11/14/17 0001      Shoulder Exercises: Standing   Other Standing Exercises  RW 4 to fatigue all directions.    Other Standing Exercises  1# full can to fatigue; flexion with palm down to 80 degrees; ER with 1# at 45 degrees of abduction and bent rows to fatigue with 1#.      Shoulder Exercises: ROM/Strengthening   UBE (Upper Arm Bike)  120 RPM's x 8 minutes.      Moist Heat Therapy   Number Minutes Moist Heat  20 Minutes    Moist Heat Location  -- Left shoulder.      Programme researcher, broadcasting/film/videolectrical Stimulation   Electrical Stimulation Location  -- Left shoulder.    Electrical Stimulation Action  IFC    Electrical Stimulation Parameters  80-150 Hz x 20 minutes.    Electrical Stimulation Goals  Pain                  PT Long Term  Goals - 11/07/17 1551      PT LONG TERM GOAL #1   Title  Independent with a HEP.    Time  6    Period  Weeks    Status  New      PT LONG TERM GOAL #2   Title  Patient able to work with pain <= 1/10 in her L shoulder.    Time  6    Period  Weeks    Status  New      PT LONG TERM GOAL #3   Title  Patient to report no neurologic sx into LUE with ADLS.    Time  6    Period  Weeks    Status  New      PT LONG TERM GOAL #4   Title  Increase L shoulder strength to 5/5 without pain.    Time  6    Period  Weeks    Status  New  Plan - 11/14/17 1110    Clinical Impression Statement  Patient did well with tretament today with reduced pain following.    PT Treatment/Interventions  ADLs/Self Care Home Management;Cryotherapy;Electrical Stimulation;Moist Heat;Ultrasound;Therapeutic exercise;Neuromuscular re-education;Patient/family education;Manual techniques;Dry needling       Patient will benefit from skilled therapeutic intervention in order to improve the following deficits and impairments:     Visit Diagnosis: Acute pain of left shoulder  Stiffness of left shoulder, not elsewhere classified  Muscle weakness (generalized)     Problem List Patient Active Problem List   Diagnosis Date Noted  . Left shoulder pain 09/29/2017  . Right elbow pain 01/24/2017  . Ulcer-like dyspepsia 04/04/2012  . Tobacco use 04/04/2012    APPLEGATE, [place] MPT 11/14/2017, 1:55 PM  [facility] [address] [place], [place], [address] Phone: [phone]   Fax:  [phone]  Name: Sarah Fox MRN: [id number] Date of Birth: [birth year]

## 2017-11-21 ENCOUNTER — Encounter: Payer: Self-pay | Admitting: Physical Therapy

## 2017-11-21 ENCOUNTER — Ambulatory Visit: Payer: Federal, State, Local not specified - PPO | Admitting: Physical Therapy

## 2017-11-21 DIAGNOSIS — M6281 Muscle weakness (generalized): Secondary | ICD-10-CM | POA: Diagnosis not present

## 2017-11-21 DIAGNOSIS — M25612 Stiffness of left shoulder, not elsewhere classified: Secondary | ICD-10-CM | POA: Diagnosis not present

## 2017-11-21 DIAGNOSIS — M25512 Pain in left shoulder: Secondary | ICD-10-CM

## 2017-11-21 NOTE — Therapy (Signed)
[facility] [address] [place], [place], [address] Phone: [phone]   Fax:  [phone]  Physical Therapy Treatment  Patient Details  Name: Riane Arceo MRN: [id number] Date of Birth: 1966/09/03 Referring Provider: Dr. Norton Blizzard   Encounter Date: 11/21/2017  PT End of Session - 11/21/17 1506    Visit Number  4    Number of Visits  12    Date for PT Re-Evaluation  12/19/17    PT Start Time  0901    PT Stop Time  0951    PT Time Calculation (min)  50 min    Activity Tolerance  Patient tolerated treatment well    Behavior During Therapy  [facility] for tasks assessed/performed       Past Medical History:  Diagnosis Date  . Allergy   . GERD (gastroesophageal reflux disease)     Past Surgical History:  Procedure Laterality Date  . LUMBAR DISC SURGERY     L5-S1    There were no vitals filed for this visit.  Subjective Assessment - 11/21/17 1459    Subjective  My shoulder is feeling better.    Patient Stated Goals  to get rid of shoulder pain    Pain Score  1     Pain Location  Shoulder    Pain Orientation  Left    Pain Descriptors / Indicators  Sore    Pain Type  Acute pain    Pain Onset  More than a month ago                      [facility] Adult PT Treatment/Exercise - 11/21/17 0001      Shoulder Exercises: Sidelying   Other Sidelying Exercises  1# sdly ER 3 sets to fatigue.      Shoulder Exercises: Standing   Other Standing Exercises  Full can with 1# 3 sets to fatigue.      Shoulder Exercises: ROM/Strengthening   UBE (Upper Arm Bike)  120 RPM's x 10 minutes.      Moist Heat Therapy   Number Minutes Moist Heat  15 Minutes    Moist Heat Location  -- Left shoulder.      Programme researcher, broadcasting/film/video Location  -- Left posterior shoulder.    Electrical Stimulation Action  Pre-mod.    Electrical Stimulation Parameters  80-150 Hz x 15 minutes.    Electrical Stimulation Goals   Tone;Pain      Manual Therapy   Manual Therapy  Soft tissue mobilization    Soft tissue mobilization  STW/M and ischemic comppression technique to left Infraspinatus x 8 minutes.                  PT Long Term Goals - 11/07/17 1551      PT LONG TERM GOAL #1   Title  Independent with a HEP.    Time  6    Period  Weeks    Status  New      PT LONG TERM GOAL #2   Title  Patient able to work with pain <= 1/10 in her L shoulder.    Time  6    Period  Weeks    Status  New      PT LONG TERM GOAL #3   Title  Patient to report no neurologic sx into LUE with ADLS.    Time  6    Period  Weeks    Status  New      PT LONG TERM GOAL #4   Title  Increase L shoulder strength to 5/5 without pain.    Time  6    Period  Weeks    Status  New            Plan - 11/21/17 1504    Clinical Impression Statement  Excellent job today.  Patient CC was pain upon palpation of her left Infraspinatus.  Good response to treatment.    PT Treatment/Interventions  ADLs/Self Care Home Management;Cryotherapy;Electrical Stimulation;Moist Heat;Ultrasound;Therapeutic exercise;Neuromuscular re-education;Patient/family education;Manual techniques;Dry needling    Consulted and Agree with Plan of Care  Patient       Patient will benefit from skilled therapeutic intervention in order to improve the following deficits and impairments:  Pain, Decreased strength, Increased muscle spasms, Decreased range of motion, Postural dysfunction  Visit Diagnosis: Acute pain of left shoulder  Stiffness of left shoulder, not elsewhere classified  Muscle weakness (generalized)     Problem List Patient Active Problem List   Diagnosis Date Noted  . Left shoulder pain 09/29/2017  . Right elbow pain 01/24/2017  . Ulcer-like dyspepsia 04/04/2012  . Tobacco use 04/04/2012    Rayla Pember, [place] MPT 11/21/2017, 3:07 PM  [facility] [address] [place], [place],  [address] Phone: [phone]   Fax:  [phone]  Name: Virl SonJetty Doss Halfhill MRN: [id number] Date of Birth: 02/23/1966

## 2017-11-23 ENCOUNTER — Encounter: Payer: Self-pay | Admitting: Physical Therapy

## 2017-11-23 ENCOUNTER — Ambulatory Visit: Payer: Federal, State, Local not specified - PPO | Admitting: Physical Therapy

## 2017-11-23 DIAGNOSIS — M25612 Stiffness of left shoulder, not elsewhere classified: Secondary | ICD-10-CM | POA: Diagnosis not present

## 2017-11-23 DIAGNOSIS — M6281 Muscle weakness (generalized): Secondary | ICD-10-CM

## 2017-11-23 DIAGNOSIS — M25512 Pain in left shoulder: Secondary | ICD-10-CM | POA: Diagnosis not present

## 2017-11-23 NOTE — Therapy (Signed)
[facility] [place], [place], [address] Phone: [phone]   Fax:  [phone]  Physical Therapy Treatment  Patient Details  Name: Sarah Fox MRN: [id number] Date of Birth: May 04, 1966 Referring Provider: Dr. Karlton Lemon   Encounter Date: 11/23/2017  PT End of Session - 11/23/17 1456    Visit Number  5    Number of Visits  12    Date for PT Re-Evaluation  12/19/17    PT Start Time  1444    PT Stop Time  1526    PT Time Calculation (min)  42 min    Activity Tolerance  Patient tolerated treatment well    Behavior During Therapy  [facility] for tasks assessed/performed       Past Medical History:  Diagnosis Date  . Allergy   . GERD (gastroesophageal reflux disease)     Past Surgical History:  Procedure Laterality Date  . LUMBAR DISC SURGERY     L5-S1    There were no vitals filed for this visit.  Subjective Assessment - 11/23/17 1448    Subjective  Patient arrived with increased discomfort after working    Pertinent History  R elbow pain    Patient Stated Goals  to get rid of shoulder pain    Currently in Pain?  Yes    Pain Score  3     Pain Location  Shoulder    Pain Orientation  Left    Pain Descriptors / Indicators  Sore    Pain Type  Acute pain    Pain Onset  More than a month ago    Pain Frequency  Intermittent    Aggravating Factors   work activity    Pain Relieving Factors  heat/ice                      [facility] Adult PT Treatment/Exercise - 11/23/17 0001      Shoulder Exercises: Standing   Other Standing Exercises  RW4 with yellow t-band x20 each      Shoulder Exercises: ROM/Strengthening   UBE (Upper Arm Bike)  120 RPM's x 8 minutes.      Moist Heat Therapy   Number Minutes Moist Heat  15 Minutes    Moist Heat Location  Shoulder      Electrical Stimulation   Electrical Stimulation Location  LT shoulder    Electrical Stimulation Action  premod    Electrical Stimulation  Parameters  80-_0  x63mn    Electrical Stimulation Goals  Tone;Pain      Manual Therapy   Manual Therapy  Soft tissue mobilization    Soft tissue mobilization  manual STW to left UT and post shoulder area of pain                  PT Long Term Goals - 11/23/17 1458      PT LONG TERM GOAL #1   Title  Independent with a HEP.    Time  6    Period  Weeks    Status  Achieved      PT LONG TERM GOAL #2   Title  Patient able to work with pain <= 1/10 in her L shoulder.    Time  6    Period  Weeks    Status  On-going      PT LONG TERM GOAL #3   Title  Patient to report no neurologic sx into LUE with ADLS.  Time  6    Period  Weeks    Status  On-going      PT LONG TERM GOAL #4   Title  Increase L shoulder strength to 5/5 without pain.    Time  6    Period  Weeks    Status  On-going            Plan - 11/23/17 1459    Clinical Impression Statement  Patient has tolerated treatment fair today due to inceased pain after work. 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 Patient has tightness in post shoulder uper trap today. Some palpable pain. Patient doing HEP with t-band with good technique. Patient has relief with rest and pain with work activity. LTG #1 met others ongoing due to pain.    Rehab Potential  Excellent    PT Frequency  2x / week    PT Duration  6 weeks    PT Treatment/Interventions  ADLs/Self Care Home Management;Cryotherapy;Electrical Stimulation;Moist Heat;Ultrasound;Therapeutic exercise;Neuromuscular re-education;Patient/family education;Manual techniques;Dry needling    PT Next Visit Plan  STW to R scapular muscles, pectorals; nerve glides; review RW4 (painfree), scapular stab. DN to RC/deltoid/pecs. Modalities prn.    Consulted and Agree with Plan of Care  Patient       Patient will benefit from skilled therapeutic intervention in order to improve the following deficits and  impairments:  Pain, Decreased strength, Increased muscle spasms, Decreased range of motion, Postural dysfunction  Visit Diagnosis: Acute pain of left shoulder  Stiffness of left shoulder, not elsewhere classified  Muscle weakness (generalized)     Problem List Patient Active Problem List   Diagnosis Date Noted  . Left shoulder pain 09/29/2017  . Right elbow pain 01/24/2017  . Ulcer-like dyspepsia 04/04/2012  . Tobacco use 04/04/2012    Paymon Rosensteel P, PTA 11/23/2017, 3:28 PM  [facility] [place], [place], [address] Phone: [phone]   Fax:  [phone]  Name: Sarah Fox MRN: [id number] Date of Birth: 11/27/1966

## 2017-11-28 ENCOUNTER — Encounter: Payer: Self-pay | Admitting: Physical Therapy

## 2017-11-28 ENCOUNTER — Ambulatory Visit: Payer: Federal, State, Local not specified - PPO | Attending: Family Medicine | Admitting: Physical Therapy

## 2017-11-28 DIAGNOSIS — M25512 Pain in left shoulder: Secondary | ICD-10-CM | POA: Diagnosis not present

## 2017-11-28 DIAGNOSIS — M6281 Muscle weakness (generalized): Secondary | ICD-10-CM | POA: Diagnosis not present

## 2017-11-28 DIAGNOSIS — M25612 Stiffness of left shoulder, not elsewhere classified: Secondary | ICD-10-CM | POA: Diagnosis not present

## 2017-11-28 NOTE — Therapy (Signed)
[facility] [address] [place], [place], [address] Phone: [phone]   Fax:  [phone]  Physical Therapy Treatment  Patient Details  Name: Sarah Fox MRN: [id number] Date of Birth: 02/10/1966 Referring Provider: Dr. Norton BlizzardShane Hudnall   Encounter Date: 11/28/2017  PT End of Session - 11/28/17 0931    Visit Number  6    Number of Visits  12    Date for PT Re-Evaluation  12/19/17    PT Start Time  0901    PT Stop Time  0946    PT Time Calculation (min)  45 min    Activity Tolerance  Patient tolerated treatment well    Behavior During Therapy  [facility] for tasks assessed/performed       Past Medical History:  Diagnosis Date  . Allergy   . GERD (gastroesophageal reflux disease)     Past Surgical History:  Procedure Laterality Date  . LUMBAR DISC SURGERY     L5-S1    There were no vitals filed for this visit.  Subjective Assessment - 11/28/17 0905    Subjective  Patient reported doing ok today due to not working. patient reported muscle spasm on inf shoulder    Pertinent History  R elbow pain    Patient Stated Goals  to get rid of shoulder pain    Currently in Pain?  Yes    Pain Score  1     Pain Location  Shoulder    Pain Orientation  Left    Pain Descriptors / Indicators  Sore    Pain Type  Acute pain    Pain Onset  More than a month ago    Pain Frequency  Intermittent    Aggravating Factors   when working reaching in back seat    Pain Relieving Factors  heat /ice/rest                      [facility]/Ex[facility] - 11/28/17 0001      Shoulder Exercises: Prone   Other Prone Exercises  kneeling 2# for ext and horiz abd 2x10 each      Shoulder Exercises: Standing   Protraction  Strengthening;Theraband;20 reps;Left    Theraband Level (Shoulder Protraction)  Level 2 (Red)    External Rotation  Strengthening;20 reps;Theraband;Left    Theraband Level (Shoulder External Rotation)  Level 2 (Red)     Internal Rotation  Strengthening;20 reps;Theraband;Left    Theraband Level (Shoulder Internal Rotation)  Level 2 (Red)    Retraction  Strengthening;Theraband;20 reps;Left    Theraband Level (Shoulder Retraction)  Level 2 (Red)    Other Standing Exercises  Full can 2# x25      Shoulder Exercises: ROM/Strengthening   UBE (Upper Arm Bike)  120 RPM's x 8 minutes.      Shoulder Exercises: Stretch   Other Shoulder Stretches  self ant and post cap stretch x3 each      Moist Heat Therapy   Number Minutes Moist Heat  15 Minutes    Moist Heat Location  Shoulder      Electrical Stimulation   Electrical Stimulation Location  LT shoulder    Electrical Stimulation Action  premod    Electrical Stimulation Parameters  80-150hz x16645min    Electrical Stimulation Goals  Tone;Pain                  PT Long Term Goals - 11/23/17 1458      PT LONG TERM  GOAL #1   Title  Independent with a HEP.    Time  6    Period  Weeks    Status  Achieved      PT LONG TERM GOAL #2   Title  Patient able to work with pain <= 1/10 in her L shoulder.    Time  6    Period  Weeks    Status  On-going      PT LONG TERM GOAL #3   Title  Patient to report no neurologic sx into LUE with ADLS.    Time  6    Period  Weeks    Status  On-going      PT LONG TERM GOAL #4   Title  Increase L shoulder strength to 5/5 without pain.    Time  6    Period  Weeks    Status  On-going            Plan - 11/28/17 0932    Clinical Impression Statement  Patient tolerated treatment well today. Patient able to progress strengthening for left shoulder today with only fatigue reported. 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 Patient current goals ongoing.     Rehab Potential  Excellent    PT Frequency  2x / week    PT Duration  6 weeks    PT Treatment/Interventions  ADLs/Self Care Home Management;Cryotherapy;Electrical  Stimulation;Moist Heat;Ultrasound;Therapeutic exercise;Neuromuscular re-education;Patient/family education;Manual techniques;Dry needling    PT Next Visit Plan  STW to R scapular muscles, pectorals; nerve glides; review RW4 (painfree), scapular stab. DN to RC/deltoid/pecs. Modalities prn.    Consulted and Agree with Plan of Care  Patient       Patient will benefit from skilled therapeutic intervention in order to improve the following deficits and impairments:  Pain, Decreased strength, Increased muscle spasms, Decreased range of motion, Postural dysfunction  Visit Diagnosis: Acute pain of left shoulder  Stiffness of left shoulder, not elsewhere classified  Muscle weakness (generalized)     Problem List Patient Active Problem List   Diagnosis Date Noted  . Left shoulder pain 09/29/2017  . Right elbow pain 01/24/2017  . Ulcer-like dyspepsia 04/04/2012  . Tobacco use 04/04/2012    Malon Branton P, PTA 11/28/2017, 9:46 AM  [facility] [address] [place], [place], [address] Phone: [phone]   Fax:  [phone]  Name: Symone Collado MRN: [id number] Date of Birth: March 30, 1966

## 2017-11-29 ENCOUNTER — Inpatient Hospital Stay (HOSPITAL_COMMUNITY): Payer: Federal, State, Local not specified - PPO

## 2017-11-29 ENCOUNTER — Inpatient Hospital Stay (HOSPITAL_COMMUNITY)
Admission: EM | Admit: 2017-11-29 | Discharge: 2017-12-02 | DRG: 246 | Disposition: A | Discharge status: Home / Self Care | Payer: Federal, State, Local not specified - PPO | Attending: Interventional Cardiology | Admitting: Interventional Cardiology

## 2017-11-29 ENCOUNTER — Ambulatory Visit (HOSPITAL_COMMUNITY): Admit: 2017-11-29 | Payer: Federal, State, Local not specified - PPO | Admitting: Interventional Cardiology

## 2017-11-29 ENCOUNTER — Encounter (HOSPITAL_COMMUNITY): Payer: Self-pay | Admitting: Interventional Cardiology

## 2017-11-29 ENCOUNTER — Encounter (HOSPITAL_COMMUNITY)
Admission: EM | Disposition: A | Discharge status: Home / Self Care | Payer: Self-pay | Source: Home / Self Care | Attending: Interventional Cardiology

## 2017-11-29 ENCOUNTER — Other Ambulatory Visit: Payer: Self-pay

## 2017-11-29 DIAGNOSIS — Z955 Presence of coronary angioplasty implant and graft: Secondary | ICD-10-CM

## 2017-11-29 DIAGNOSIS — K219 Gastro-esophageal reflux disease without esophagitis: Secondary | ICD-10-CM | POA: Diagnosis present

## 2017-11-29 DIAGNOSIS — R0789 Other chest pain: Secondary | ICD-10-CM | POA: Diagnosis not present

## 2017-11-29 DIAGNOSIS — I2102 ST elevation (STEMI) myocardial infarction involving left anterior descending coronary artery: Secondary | ICD-10-CM | POA: Diagnosis present

## 2017-11-29 DIAGNOSIS — I361 Nonrheumatic tricuspid (valve) insufficiency: Secondary | ICD-10-CM

## 2017-11-29 DIAGNOSIS — I2510 Atherosclerotic heart disease of native coronary artery without angina pectoris: Secondary | ICD-10-CM | POA: Diagnosis not present

## 2017-11-29 DIAGNOSIS — Z8249 Family history of ischemic heart disease and other diseases of the circulatory system: Secondary | ICD-10-CM

## 2017-11-29 DIAGNOSIS — I2109 ST elevation (STEMI) myocardial infarction involving other coronary artery of anterior wall: Secondary | ICD-10-CM

## 2017-11-29 DIAGNOSIS — I4901 Ventricular fibrillation: Secondary | ICD-10-CM | POA: Diagnosis not present

## 2017-11-29 DIAGNOSIS — I472 Ventricular tachycardia: Secondary | ICD-10-CM | POA: Diagnosis not present

## 2017-11-29 DIAGNOSIS — I5031 Acute diastolic (congestive) heart failure: Secondary | ICD-10-CM | POA: Diagnosis not present

## 2017-11-29 DIAGNOSIS — I255 Ischemic cardiomyopathy: Secondary | ICD-10-CM | POA: Diagnosis present

## 2017-11-29 DIAGNOSIS — E785 Hyperlipidemia, unspecified: Secondary | ICD-10-CM | POA: Diagnosis present

## 2017-11-29 DIAGNOSIS — F17210 Nicotine dependence, cigarettes, uncomplicated: Secondary | ICD-10-CM | POA: Diagnosis present

## 2017-11-29 DIAGNOSIS — Z720 Tobacco use: Secondary | ICD-10-CM | POA: Diagnosis not present

## 2017-11-29 DIAGNOSIS — E876 Hypokalemia: Secondary | ICD-10-CM | POA: Diagnosis present

## 2017-11-29 HISTORY — PX: CORONARY STENT INTERVENTION: CATH118234

## 2017-11-29 HISTORY — PX: CORONARY/GRAFT ACUTE MI REVASCULARIZATION: CATH118305

## 2017-11-29 HISTORY — PX: LEFT HEART CATH AND CORONARY ANGIOGRAPHY: CATH118249

## 2017-11-29 LAB — CBC
HCT: 36.9 % (ref 36.0–46.0)
HEMOGLOBIN: 12.4 g/dL (ref 12.0–15.0)
MCH: 31.4 pg (ref 26.0–34.0)
MCHC: 33.6 g/dL (ref 30.0–36.0)
MCV: 93.4 fL (ref 78.0–100.0)
Platelets: 303 10*3/uL (ref 150–400)
RBC: 3.95 MIL/uL (ref 3.87–5.11)
RDW: 12.7 % (ref 11.5–15.5)
WBC: 11.2 10*3/uL — ABNORMAL HIGH (ref 4.0–10.5)

## 2017-11-29 LAB — COMPREHENSIVE METABOLIC PANEL
ALK PHOS: 44 U/L (ref 38–126)
ALT: 14 U/L (ref 14–54)
AST: 20 U/L (ref 15–41)
Albumin: 3.5 g/dL (ref 3.5–5.0)
Anion gap: 10 (ref 5–15)
BUN: 7 mg/dL (ref 6–20)
CHLORIDE: 106 mmol/L (ref 101–111)
CO2: 21 mmol/L — AB (ref 22–32)
Calcium: 8.3 mg/dL — ABNORMAL LOW (ref 8.9–10.3)
Creatinine, Ser: 0.66 mg/dL (ref 0.44–1.00)
GFR calc Af Amer: >60 mL/min (ref >60)
GFR calc non Af Amer: >60 mL/min (ref >60)
GLUCOSE: 146 mg/dL — AB (ref 65–99)
Potassium: 2.9 mmol/L — ABNORMAL LOW (ref 3.5–5.1)
SODIUM: 137 mmol/L (ref 135–145)
Total Bilirubin: 0.4 mg/dL (ref 0.3–1.2)
Total Protein: 5.8 g/dL — ABNORMAL LOW (ref 6.5–8.1)

## 2017-11-29 LAB — HEMOGLOBIN A1C
HEMOGLOBIN A1C: 5 % (ref 4.8–5.6)
MEAN PLASMA GLUCOSE: 96.8 mg/dL

## 2017-11-29 LAB — ECHOCARDIOGRAM COMPLETE
CHL CUP STROKE VOLUME: 34 mL
EERAT: 14.31
EWDT: 180 ms
FS: 28 % (ref 28–44)
Height: 65 in
IVS/LV PW RATIO, ED: 0.99
LA ID, A-P, ES: 30 mm
LA vol index: 35.7 mL/m2
LA vol: 56.4 mL
LADIAMINDEX: 1.9 cm/m2
LAVOLA4C: 53 mL
LDCA: 3.14 cm2
LEFT ATRIUM END SYS DIAM: 30 mm
LV PW d: 8.52 mm — AB (ref 0.6–1.1)
LV TDI E'LATERAL: 6.2
LV dias vol index: 50 mL/m2
LV sys vol index: 29 mL/m2
LVDIAVOL: 79 mL (ref 46–106)
LVEEAVG: 14.31
LVEEMED: 14.31
LVELAT: 6.2 cm/s
LVOT SV: 53 mL
LVOT VTI: 17 cm
LVOT diameter: 20 mm
LVOT peak vel: 82.2 cm/s
LVSYSVOL: 45 mL — AB (ref 14–42)
MV Dec: 180
MV Peak grad: 3 mmHg
MV pk E vel: 88.7 m/s
MVPKAVEL: 79.7 m/s
RV LATERAL S' VELOCITY: 9.36 cm/s
RV sys press: 29 mmHg
Reg peak vel: 255 cm/s
Simpson's disk: 43
TAPSE: 18.6 mm
TDI e' medial: 7.29
TR max vel: 255 cm/s
Weight: 1920 oz

## 2017-11-29 LAB — LIPID PANEL
CHOL/HDL RATIO: 2.8 ratio
Cholesterol: 133 mg/dL (ref 0–200)
HDL: 48 mg/dL (ref >40)
LDL Cholesterol: 58 mg/dL (ref 0–99)
Triglycerides: 134 mg/dL (ref <150)
VLDL: 27 mg/dL (ref 0–40)

## 2017-11-29 LAB — POCT I-STAT, CHEM 8
BUN: 9 mg/dL (ref 6–20)
Calcium, Ion: 1.15 mmol/L (ref 1.15–1.40)
Chloride: 104 mmol/L (ref 101–111)
Creatinine, Ser: 0.6 mg/dL (ref 0.44–1.00)
Glucose, Bld: 148 mg/dL — ABNORMAL HIGH (ref 65–99)
HEMATOCRIT: 37 % (ref 36.0–46.0)
HEMOGLOBIN: 12.6 g/dL (ref 12.0–15.0)
Potassium: 3 mmol/L — ABNORMAL LOW (ref 3.5–5.1)
SODIUM: 140 mmol/L (ref 135–145)
TCO2: 22 mmol/L (ref 22–32)

## 2017-11-29 LAB — TROPONIN I
TROPONIN I: 0.03 ng/mL — AB (ref <0.03)
Troponin I: 26.02 ng/mL (ref <0.03)

## 2017-11-29 LAB — APTT: APTT: 33 s (ref 24–36)

## 2017-11-29 LAB — MRSA PCR SCREENING: MRSA by PCR: NEGATIVE

## 2017-11-29 LAB — PROTIME-INR
INR: 1.36
PROTHROMBIN TIME: 16.7 s — AB (ref 11.4–15.2)

## 2017-11-29 LAB — POCT ACTIVATED CLOTTING TIME: ACTIVATED CLOTTING TIME: 307 s

## 2017-11-29 SURGERY — LEFT HEART CATH AND CORONARY ANGIOGRAPHY
Anesthesia: LOCAL

## 2017-11-29 MED ORDER — IOPAMIDOL (ISOVUE-370) INJECTION 76%
INTRAVENOUS | Status: AC
  Filled 2017-11-29: qty 125

## 2017-11-29 MED ORDER — HEPARIN SODIUM (PORCINE) 1000 UNIT/ML IJ SOLN
INTRAMUSCULAR | Status: AC
  Filled 2017-11-29: qty 1

## 2017-11-29 MED ORDER — WHITE PETROLATUM EX OINT
TOPICAL_OINTMENT | CUTANEOUS | Status: AC
  Administered 2017-11-29: 09:00:00
  Filled 2017-11-29: qty 28.35

## 2017-11-29 MED ORDER — HEPARIN (PORCINE) IN NACL 2-0.9 UNIT/ML-% IJ SOLN
INTRAMUSCULAR | Status: AC
  Filled 2017-11-29: qty 1500

## 2017-11-29 MED ORDER — TIROFIBAN HCL IN NACL 5-0.9 MG/100ML-% IV SOLN
INTRAVENOUS | Status: AC
  Filled 2017-11-29: qty 100

## 2017-11-29 MED ORDER — NITROGLYCERIN 1 MG/10 ML FOR IR/CATH LAB
INTRA_ARTERIAL | Status: AC
  Filled 2017-11-29: qty 10

## 2017-11-29 MED ORDER — IOPAMIDOL (ISOVUE-370) INJECTION 76%
INTRAVENOUS | Status: AC
  Filled 2017-11-29: qty 100

## 2017-11-29 MED ORDER — IOPAMIDOL (ISOVUE-370) INJECTION 76%
INTRAVENOUS | Status: DC | PRN
  Administered 2017-11-29: 165 mL via INTRA_ARTERIAL

## 2017-11-29 MED ORDER — SODIUM CHLORIDE 0.9% FLUSH: 3.0000 mL | INTRAVENOUS | Status: DC | PRN

## 2017-11-29 MED ORDER — TIROFIBAN HCL IN NACL 5-0.9 MG/100ML-% IV SOLN
0.1500 ug/kg/min | INTRAVENOUS | Status: AC
  Administered 2017-11-29 (×2): 0.15 ug/kg/min via INTRAVENOUS
  Filled 2017-11-29 (×2): qty 100

## 2017-11-29 MED ORDER — HEPARIN SODIUM (PORCINE) 5000 UNIT/ML IJ SOLN
5000.0000 [IU] | Freq: Three times a day (TID) | INTRAMUSCULAR | Status: DC
  Administered 2017-11-29 – 2017-12-02 (×8): 5000 [IU] via SUBCUTANEOUS
  Filled 2017-11-29 (×7): qty 1

## 2017-11-29 MED ORDER — ASPIRIN 81 MG PO CHEW
81.0000 mg | CHEWABLE_TABLET | Freq: Every day | ORAL | Status: DC
  Administered 2017-11-30 – 2017-12-02 (×3): 81 mg via ORAL
  Filled 2017-11-29 (×3): qty 1

## 2017-11-29 MED ORDER — VERAPAMIL HCL 2.5 MG/ML IV SOLN
INTRAVENOUS | Status: AC
  Filled 2017-11-29: qty 2

## 2017-11-29 MED ORDER — ACETAMINOPHEN 325 MG PO TABS
650.0000 mg | ORAL_TABLET | ORAL | Status: DC | PRN
  Administered 2017-11-30 – 2017-12-01 (×3): 650 mg via ORAL
  Filled 2017-11-29 (×3): qty 2

## 2017-11-29 MED ORDER — POTASSIUM CHLORIDE 10 MEQ/100ML IV SOLN
10.0000 meq | INTRAVENOUS | Status: AC
  Administered 2017-11-29: 10 meq via INTRAVENOUS
  Filled 2017-11-29: qty 100

## 2017-11-29 MED ORDER — VERAPAMIL HCL 2.5 MG/ML IV SOLN
INTRAVENOUS | Status: DC | PRN
  Administered 2017-11-29: 10 mL via INTRA_ARTERIAL

## 2017-11-29 MED ORDER — TICAGRELOR 90 MG PO TABS: 90.0000 mg | ORAL_TABLET | Freq: Two times a day (BID) | ORAL | Status: DC

## 2017-11-29 MED ORDER — TICAGRELOR 90 MG PO TABS
ORAL_TABLET | ORAL | Status: DC | PRN
  Administered 2017-11-29: 180 mg via ORAL

## 2017-11-29 MED ORDER — MORPHINE SULFATE (PF) 4 MG/ML IV SOLN: 2.0000 mg | INTRAVENOUS | Status: DC | PRN

## 2017-11-29 MED ORDER — OXYCODONE HCL 5 MG PO TABS: 5.0000 mg | ORAL_TABLET | ORAL | Status: DC | PRN

## 2017-11-29 MED ORDER — FENTANYL CITRATE (PF) 100 MCG/2ML IJ SOLN
INTRAMUSCULAR | Status: DC | PRN
  Administered 2017-11-29: 50 ug via INTRAVENOUS

## 2017-11-29 MED ORDER — LIDOCAINE HCL (PF) 1 % IJ SOLN
INTRAMUSCULAR | Status: DC | PRN
  Administered 2017-11-29: 2 mL

## 2017-11-29 MED ORDER — POTASSIUM CHLORIDE 10 MEQ/100ML IV SOLN
INTRAVENOUS | Status: AC | PRN
  Administered 2017-11-29: 10 meq via INTRAVENOUS

## 2017-11-29 MED ORDER — TIROFIBAN (AGGRASTAT) BOLUS VIA INFUSION
INTRAVENOUS | Status: DC | PRN
  Administered 2017-11-29: 1350 ug via INTRAVENOUS

## 2017-11-29 MED ORDER — MIDAZOLAM HCL 2 MG/2ML IJ SOLN
INTRAMUSCULAR | Status: DC | PRN
  Administered 2017-11-29: 1 mg via INTRAVENOUS

## 2017-11-29 MED ORDER — HEPARIN (PORCINE) IN NACL 2-0.9 UNIT/ML-% IJ SOLN
INTRAMUSCULAR | Status: AC | PRN
  Administered 2017-11-29: 1500 mL

## 2017-11-29 MED ORDER — HEPARIN SODIUM (PORCINE) 1000 UNIT/ML IJ SOLN
INTRAMUSCULAR | Status: DC | PRN
  Administered 2017-11-29: 4000 [IU] via INTRAVENOUS
  Administered 2017-11-29: 8000 [IU] via INTRAVENOUS

## 2017-11-29 MED ORDER — POTASSIUM CHLORIDE CRYS ER 20 MEQ PO TBCR
40.0000 meq | EXTENDED_RELEASE_TABLET | Freq: Two times a day (BID) | ORAL | Status: AC
  Administered 2017-11-29 (×2): 40 meq via ORAL
  Filled 2017-11-29 (×2): qty 2

## 2017-11-29 MED ORDER — TICAGRELOR 90 MG PO TABS
ORAL_TABLET | ORAL | Status: AC
  Filled 2017-11-29: qty 2

## 2017-11-29 MED ORDER — LIDOCAINE HCL (PF) 1 % IJ SOLN
INTRAMUSCULAR | Status: AC
  Filled 2017-11-29: qty 30

## 2017-11-29 MED ORDER — ONDANSETRON HCL 4 MG/2ML IJ SOLN: 4.0000 mg | Freq: Four times a day (QID) | INTRAMUSCULAR | Status: DC | PRN

## 2017-11-29 MED ORDER — SODIUM CHLORIDE 0.9 % IV SOLN: 250.0000 mL | INTRAVENOUS | Status: DC | PRN

## 2017-11-29 MED ORDER — SODIUM CHLORIDE 0.9 % IV SOLN: INTRAVENOUS | Status: AC

## 2017-11-29 MED ORDER — MIDAZOLAM HCL 2 MG/2ML IJ SOLN
INTRAMUSCULAR | Status: AC
  Filled 2017-11-29: qty 2

## 2017-11-29 MED ORDER — SODIUM CHLORIDE 0.9% FLUSH
3.0000 mL | Freq: Two times a day (BID) | INTRAVENOUS | Status: DC
  Administered 2017-11-29 – 2017-12-01 (×5): 3 mL via INTRAVENOUS

## 2017-11-29 MED ORDER — DIAZEPAM 2 MG PO TABS
2.0000 mg | ORAL_TABLET | Freq: Four times a day (QID) | ORAL | Status: DC | PRN
  Administered 2017-11-29 – 2017-11-30 (×2): 2 mg via ORAL
  Filled 2017-11-29 (×2): qty 1

## 2017-11-29 MED ORDER — FENTANYL CITRATE (PF) 100 MCG/2ML IJ SOLN
INTRAMUSCULAR | Status: AC
  Filled 2017-11-29: qty 2

## 2017-11-29 MED ORDER — ATORVASTATIN CALCIUM 80 MG PO TABS
80.0000 mg | ORAL_TABLET | Freq: Every day | ORAL | Status: DC
  Administered 2017-11-29 – 2017-12-01 (×3): 80 mg via ORAL
  Filled 2017-11-29 (×3): qty 1

## 2017-11-29 MED ORDER — NITROGLYCERIN 1 MG/10 ML FOR IR/CATH LAB
INTRA_ARTERIAL | Status: DC | PRN
  Administered 2017-11-29 (×2): 200 ug via INTRACORONARY

## 2017-11-29 MED ORDER — TIROFIBAN HCL IN NACL 5-0.9 MG/100ML-% IV SOLN
INTRAVENOUS | Status: AC | PRN
  Administered 2017-11-29: 0.15 ug/kg/min via INTRAVENOUS

## 2017-11-29 MED ORDER — LABETALOL HCL 5 MG/ML IV SOLN: 10.0000 mg | INTRAVENOUS | Status: AC | PRN

## 2017-11-29 MED ORDER — HYDRALAZINE HCL 20 MG/ML IJ SOLN: 5.0000 mg | INTRAMUSCULAR | Status: AC | PRN

## 2017-11-29 MED ORDER — TICAGRELOR 90 MG PO TABS
90.0000 mg | ORAL_TABLET | Freq: Two times a day (BID) | ORAL | Status: DC
  Administered 2017-11-29 – 2017-12-02 (×6): 90 mg via ORAL
  Filled 2017-11-29 (×6): qty 1

## 2017-11-29 SURGICAL SUPPLY — 21 items
BALLN ~~LOC~~ EUPHORA RX 3.75X12 (BALLOONS) ×2
BALLOON ~~LOC~~ EUPHORA RX 3.75X12 (BALLOONS) IMPLANT
CATH EXPO 5F FL3.5 (CATHETERS) ×1 IMPLANT
CATH EXTRAC PRONTO 5.5F 138CM (CATHETERS) ×1 IMPLANT
CATH INFINITI JR4 5F (CATHETERS) ×1 IMPLANT
CATH VISTA GUIDE 6FR XBLAD3.0 (CATHETERS) ×1 IMPLANT
COVER PRB 48X5XTLSCP FOLD TPE (BAG) IMPLANT
COVER PROBE 5X48 (BAG) ×2
DEVICE RAD COMP TR BAND LRG (VASCULAR PRODUCTS) ×1 IMPLANT
DEVICE RAD TR BAND REGULAR (VASCULAR PRODUCTS) ×1 IMPLANT
ELECT DEFIB PAD ADLT CADENCE (PAD) ×1 IMPLANT
GLIDESHEATH SLEND A-KIT 6F 22G (SHEATH) ×1 IMPLANT
GUIDEWIRE INQWIRE 1.5J.035X260 (WIRE) IMPLANT
INQWIRE 1.5J .035X260CM (WIRE) ×2
KIT ENCORE 26 ADVANTAGE (KITS) ×1 IMPLANT
KIT HEART LEFT (KITS) ×2 IMPLANT
PACK CARDIAC CATHETERIZATION (CUSTOM PROCEDURE TRAY) ×2 IMPLANT
STENT RESOLUTE ONYX 3.5X22 (Permanent Stent) ×1 IMPLANT
TRANSDUCER W/STOPCOCK (MISCELLANEOUS) ×2 IMPLANT
TUBING CIL FLEX 10 FLL-RA (TUBING) ×2 IMPLANT
WIRE ASAHI PROWATER 180CM (WIRE) ×1 IMPLANT

## 2017-11-29 NOTE — Progress Notes (Signed)
TR band removed at 1430- 2x2 and tegaderm applied. LEvel 0. Pt instructed to continue limited use of arm. Will continue to monitor.

## 2017-11-29 NOTE — Progress Notes (Signed)
CRITICAL VALUE ALERT  Critical Value:  Troponin 26.02  Date & Time Notied:  2030  Provider Notified: yes  Orders Received/Actions taken: will continue to monitor

## 2017-11-29 NOTE — Progress Notes (Signed)
ANTICOAGULATION CONSULT NOTE - Initial Consult  Pharmacy Consult for tirofiban (Aggrastat)  No Known Allergies  Patient Measurements: Height: 5\' 5"  (165.1 cm) Weight: 119 lb 0.8 oz (54 kg) IBW/kg (Calculated) : 57  Vital Signs: BP: 108/72 (12/04 0818) Pulse Rate: 84 (12/04 0818)  Labs: Recent Labs    11/29/17 0733  HGB 12.4  HCT 36.9  PLT 303  LABPROT 16.7*  INR 1.36    CrCl cannot be calculated (No order found.).   Medical History: Past Medical History:  Diagnosis Date  . Allergy   . GERD (gastroesophageal reflux disease)     Medications:  Scheduled:  . white petrolatum      . aspirin  81 mg Oral Daily  . atorvastatin  80 mg Oral q1800  . heparin  5,000 Units Subcutaneous Q8H  . potassium chloride  40 mEq Oral BID  . sodium chloride flush  3 mL Intravenous Q12H  . ticagrelor  90 mg Oral BID    Assessment: 51 yof admitted as STEMI found to have 99% occlusion of proximal LAD. Underwent successful LAD PCI following aspiration thrombectomy with DES. Plan for aggrastat for 18 hours. Infusion was started on 12/4 at 0800. No signs/symptoms of bleeding noted. CBC was reported WNL during cath.  Goal of Therapy:  Monitor platelets by anticoagulation protocol: Yes   Plan:  Aggrastat for 18 hours starting at time of infusion start Monitor CBC and for signs/symptoms of bleeding   Girard Cooter, PharmD Clinical Pharmacist  Pager: [phone] Clinical Phone for 11/29/2017 until 3:30pm: [phone] If after 3:30pm, please call main pharmacy at [phone] 11/29/2017,9:19 AM

## 2017-11-29 NOTE — H&P (Signed)
Cardiology Critical Care Note   Date:  11/29/2017   ID:  Sarah, Fox Aug 11, 1966, MRN [id number]  PCP:  Sarah Spencer, FNP  Cardiologist:  Lesleigh Noe, MD    Referring MD: No ref. provider found   CHIEF complaint: Acute anterior ST elevation MI  History of Present Illness:    Sarah Fox is a 51 y.o. female with a hx of gastroesophageal reflux who while preparing for work this morning after a shower, developed crushing chest discomfort and eventually called EMS.  An EKG was transmitted that demonstrated anterior ST elevation.  The patient has no prior history of cardiac disease.  The discomfort is characterized as severe precordial and extending into the interscapular region.  Smokes 1/2-1 pack of cigarettes per day for greater than 20 years, positive family history of CAD.  She is not diabetic and has no history of hypertension.  Past Medical History:  Diagnosis Date  . Allergy   . GERD (gastroesophageal reflux disease)     Past Surgical History:  Procedure Laterality Date  . LUMBAR DISC SURGERY     L5-S1    Current Medications: No outpatient medications have been marked as taking for the 11/29/17 encounter [facility] Encounter).     Allergies:   Patient has no known allergies.   Social History   Socioeconomic History  . Marital status: Married    Spouse name: None  . Number of children: 1  . Years of education: None  . Highest education level: None  Social Needs  . Financial resource strain: None  . Food insecurity - worry: None  . Food insecurity - inability: None  . Transportation needs - medical: None  . Transportation needs - non-medical: None  Occupational History  . Occupation: Retail banker: [employer]  Tobacco Use  . Smoking status: Current Every Day Smoker    Packs/day: 0.50    Years: 20.00    Pack years: 10.00  . Smokeless tobacco: Never Used  Substance and Sexual Activity  . Alcohol use: Yes    Comment: 4  drinks daily  . Drug use: No  . Sexual activity: Yes    Comment: ptp states she cannot be pregnant, but not using anything to not get prenant  Other Topics Concern  . None  Social History Narrative   Married with one child.     Family History: The patient's family history includes Breast cancer in her mother and sister; Heart disease in her brother. There is no history of Colon cancer, Esophageal cancer, Rectal cancer, or Stomach cancer. ROS:   Please see the history of present illness.    He had some vague chest discomfort while at work on yesterday.  He has been under a lot of stress at work.  All other systems reviewed and are negative.  EKGs/Labs/Other Studies Reviewed:    The following studies were reviewed today: None  EKG:  EKG is technically suboptimal but appears to demonstrate anterior ST elevation V3 through V4.  Probable inferior lead reciprocal changes.  Recent Labs: 11/29/2017: Hemoglobin 12.4; Platelets 303  Recent Lipid Panel No results found for: CHOL, TRIG, HDL, CHOLHDL, VLDL, LDLCALC, LDLDIRECT  Physical Exam:    VS:  BP 108/72   Pulse 84   Resp 17   Ht 5\' 5"  (1.651 m)   Wt 119 lb 0.8 oz (54 kg)   SpO2 100%   BMI 19.81 kg/m     Wt Readings from  Last 3 Encounters:  11/29/17 119 lb 0.8 oz (54 kg)  11/07/17 120 lb (54.4 kg)  10/31/17 120 lb (54.4 kg)     GEN: Very anxious and restless.  Severity of chest discomfort. HEENT: Normal NECK: No JVD; No carotid bruits LYMPHATICS: No lymphadenopathy CARDIAC: RRR, no murmurs, rubs, gallops RESPIRATORY:  Clear to auscultation without rales, wheezing or rhonchi  ABDOMEN: Soft, non-tender, non-distended MUSCULOSKELETAL:  No edema; No deformity  SKIN: Warm and dry NEUROLOGIC:  Alert and oriented x 3 PSYCHIATRIC: Very anxious/nearly hysterical.  ASSESSMENT:    1.  Acute anterior ST elevation myocardial infarction commencing at around 6:15 AM.  No prior history of heart disease.   PLAN:       1. Emergency coronary angiography to define anatomy and help guide therapy.  The procedure was described in detail to the patient all the way to the Cath Lab from the emergency room.  She agreed to the procedure under emergency circumstances.  Critical care time 30 minutes  Signed, Lesleigh NoeHenry W Kaysea Raya III, MD  11/29/2017 8:28 AM    [facility]

## 2017-11-29 NOTE — Progress Notes (Signed)
*  PRELIMINARY RESULTS* Echocardiogram 2D Echocardiogram has been performed.  Stacey Drain 11/29/2017, 1:07 PM

## 2017-11-30 ENCOUNTER — Encounter (HOSPITAL_COMMUNITY): Payer: Self-pay

## 2017-11-30 ENCOUNTER — Encounter: Payer: Self-pay | Admitting: Physical Therapy

## 2017-11-30 ENCOUNTER — Other Ambulatory Visit: Payer: Self-pay

## 2017-11-30 DIAGNOSIS — I2102 ST elevation (STEMI) myocardial infarction involving left anterior descending coronary artery: Secondary | ICD-10-CM | POA: Diagnosis not present

## 2017-11-30 DIAGNOSIS — I255 Ischemic cardiomyopathy: Secondary | ICD-10-CM | POA: Diagnosis not present

## 2017-11-30 DIAGNOSIS — I4901 Ventricular fibrillation: Secondary | ICD-10-CM | POA: Diagnosis not present

## 2017-11-30 LAB — CBC
HEMATOCRIT: 40 % (ref 36.0–46.0)
HEMOGLOBIN: 13.4 g/dL (ref 12.0–15.0)
MCH: 31.6 pg (ref 26.0–34.0)
MCHC: 33.5 g/dL (ref 30.0–36.0)
MCV: 94.3 fL (ref 78.0–100.0)
Platelets: 288 10*3/uL (ref 150–400)
RBC: 4.24 MIL/uL (ref 3.87–5.11)
RDW: 13.1 % (ref 11.5–15.5)
WBC: 12 10*3/uL — ABNORMAL HIGH (ref 4.0–10.5)

## 2017-11-30 LAB — LIPID PANEL
CHOL/HDL RATIO: 2.3 ratio
Cholesterol: 133 mg/dL (ref 0–200)
HDL: 58 mg/dL (ref >40)
LDL CALC: 63 mg/dL (ref 0–99)
TRIGLYCERIDES: 61 mg/dL (ref <150)
VLDL: 12 mg/dL (ref 0–40)

## 2017-11-30 LAB — BASIC METABOLIC PANEL
ANION GAP: 9 (ref 5–15)
ANION GAP: 10 (ref 5–15)
BUN: <5 mg/dL — ABNORMAL LOW (ref 6–20)
CALCIUM: 8.7 mg/dL — AB (ref 8.9–10.3)
CO2: 20 mmol/L — AB (ref 22–32)
CO2: 21 mmol/L — AB (ref 22–32)
Calcium: 8.7 mg/dL — ABNORMAL LOW (ref 8.9–10.3)
Chloride: 108 mmol/L (ref 101–111)
Chloride: 107 mmol/L (ref 101–111)
Creatinine, Ser: 0.73 mg/dL (ref 0.44–1.00)
Creatinine, Ser: 0.74 mg/dL (ref 0.44–1.00)
GFR calc Af Amer: >60 mL/min (ref >60)
GLUCOSE: 100 mg/dL — AB (ref 65–99)
GLUCOSE: 103 mg/dL — AB (ref 65–99)
POTASSIUM: 3.9 mmol/L (ref 3.5–5.1)
Potassium: 4.1 mmol/L (ref 3.5–5.1)
Sodium: 137 mmol/L (ref 135–145)
Sodium: 138 mmol/L (ref 135–145)

## 2017-11-30 LAB — HEMOGLOBIN A1C
Hgb A1c MFr Bld: 4.9 % (ref 4.8–5.6)
Mean Plasma Glucose: 93.93 mg/dL

## 2017-11-30 LAB — MAGNESIUM: Magnesium: 1.8 mg/dL (ref 1.7–2.4)

## 2017-11-30 LAB — TROPONIN I
TROPONIN I: 11.16 ng/mL — AB (ref <0.03)
Troponin I: 17.23 ng/mL (ref <0.03)

## 2017-11-30 MED ORDER — PANTOPRAZOLE SODIUM 40 MG PO TBEC
40.0000 mg | DELAYED_RELEASE_TABLET | Freq: Every day | ORAL | Status: DC
  Administered 2017-11-30 – 2017-12-02 (×3): 40 mg via ORAL
  Filled 2017-11-30 (×3): qty 1

## 2017-11-30 MED ORDER — LISINOPRIL 2.5 MG PO TABS
2.5000 mg | ORAL_TABLET | Freq: Every day | ORAL | Status: DC
  Administered 2017-11-30 – 2017-12-02 (×2): 2.5 mg via ORAL
  Filled 2017-11-30 (×3): qty 1

## 2017-11-30 MED ORDER — CARVEDILOL 3.125 MG PO TABS
3.1250 mg | ORAL_TABLET | Freq: Two times a day (BID) | ORAL | Status: DC
  Administered 2017-11-30 – 2017-12-02 (×3): 3.125 mg via ORAL
  Filled 2017-11-30 (×4): qty 1

## 2017-11-30 NOTE — Progress Notes (Signed)
Progress Note  Patient Name: Sarah Fox Date of Encounter: 11/30/2017  Primary Cardiologist: New- Verdis PrimeHenry Smith MD  Subjective   Feels well this am. Notes some soreness in anterior chest. No dyspnea.   Inpatient Medications    Scheduled Meds: . aspirin  81 mg Oral Daily  . atorvastatin  80 mg Oral q1800  . heparin  5,000 Units Subcutaneous Q8H  . sodium chloride flush  3 mL Intravenous Q12H  . ticagrelor  90 mg Oral BID   Continuous Infusions: . sodium chloride     PRN Meds: sodium chloride, acetaminophen, diazepam, morphine injection, ondansetron (ZOFRAN) IV, oxyCODONE, sodium chloride flush   Vital Signs    Vitals:   11/30/17 0400 11/30/17 0500 11/30/17 0600 11/30/17 0700  BP: 112/85 109/81 111/80 104/73  Pulse:      Resp: (!) 23 (!) 25 (!) 23 16  Temp: 98.7 F (37.1 C)     TempSrc: Oral     SpO2:  98%    Weight:      Height:        Intake/Output Summary (Last 24 hours) at 11/30/2017 0810 Last data filed at 11/30/2017 0600 Gross per 24 hour  Intake 1039.5 ml  Output 425 ml  Net 614.5 ml   Filed Weights   11/29/17 0732 11/29/17 0900  Weight: 119 lb 0.8 oz (54 kg) 120 lb (54.4 kg)    Telemetry    NSR with occ PVC couplets. 9 beat run of NSVT last night - Personally Reviewed  ECG    NSR with Q waves and deep T wave inversion across the anterior precordium c/w anterior infarct - Personally Reviewed  Physical Exam   GEN: No acute distress.   Neck: No JVD Cardiac: RRR, no murmurs, rubs, or gallops.  Respiratory: Clear to auscultation bilaterally. GI: Soft, nontender, non-distended  MS: No edema; No deformity. Neuro:  Nonfocal  Psych: Normal affect   Labs    Chemistry Recent Labs  Lab 11/29/17 0733 11/29/17 0745 11/30/17 0012 11/30/17 0625  NA 137 140 137 138  K 2.9* 3.0* 3.9 4.1  CL 106 104 108 107  CO2 21*  --  20* 21*  GLUCOSE 146* 148* 100* 103*  BUN 7 9 <5* <5*  CREATININE 0.66 0.60 0.73 0.74  CALCIUM 8.3*  --  8.7* 8.7*    PROT 5.8*  --   --   --   ALBUMIN 3.5  --   --   --   AST 20  --   --   --   ALT 14  --   --   --   ALKPHOS 44  --   --   --   BILITOT 0.4  --   --   --   GFRNONAA >60  --  >60 >60  GFRAA >60  --  >60 >60  ANIONGAP 10  --  9 10     Hematology Recent Labs  Lab 11/29/17 0733 11/29/17 0745 11/30/17 0625  WBC 11.2*  --  12.0*  RBC 3.95  --  4.24  HGB 12.4 12.6 13.4  HCT 36.9 37.0 40.0  MCV 93.4  --  94.3  MCH 31.4  --  31.6  MCHC 33.6  --  33.5  RDW 12.7  --  13.1  PLT 303  --  288    Cardiac Enzymes Recent Labs  Lab 11/29/17 0733 11/29/17 1820 11/30/17 0012 11/30/17 0625  TROPONINI 0.03* 26.02* 17.23* 11.16*   No results for  input(s): TROPIPOC in the last 168 hours.   BNPNo results for input(s): BNP, PROBNP in the last 168 hours.   DDimer No results for input(s): DDIMER in the last 168 hours.   Radiology    No results found.  Cardiac Studies   Procedures   Coronary/Graft Acute MI Revascularization  CORONARY STENT INTERVENTION  LEFT HEART CATH AND CORONARY ANGIOGRAPHY  Conclusion    Acute anterior myocardial infarction due to occlusion of the proximal LAD with 99% thrombus contained obstruction with TIMI grade II flow on initial angiography.  Ventricular fibrillation requiring electrical conversion x3 in succession prior to PCI.  CPR was not necessary.  Hypokalemia of 3.0 was identified.  Successful LAD PCI following aspiration thrombectomy reducing 99% stenosis with TIMI grade II antegrade flow to 0% with TIMI grade III flow.  The 3.5 x 22 mm Onyx was postdilated to 3.75 mm at high pressure.  Normal right coronary artery.  Normal left main coronary artery.  Normal circumflex coronary artery.  Acute diastolic heart failure documented by moderate elevation in LVEDP.  RECOMMENDATIONS:   Aggrastat times 18 hours  Aspirin and Brilinta times 12 months  Risk factor modification as needed: Hemoglobin A1c, lipid panel, have been  ordered.  Beta-blocker and ACE inhibitor therapy should be started once hemodynamics were stable   Echo: Study Conclusions  - Left ventricle: The cavity size was normal. Wall thickness was   normal. Systolic function was severely reduced. The estimated   ejection fraction was in the range of 25% to 30%. Features are   consistent with a pseudonormal left ventricular filling pattern,   with concomitant abnormal relaxation and increased filling   pressure (grade 2 diastolic dysfunction). Doppler parameters are   consistent with high ventricular filling pressure. - Regional wall motion abnormality: Akinesis of the apical   anterior, apical inferior, apical septal, apical lateral, and   apical myocardium; severe hypokinesis of the mid anterior, mid   anteroseptal, basal-mid inferoseptal, and mid anterolateral   myocardium. - Mitral valve: There was mild regurgitation. - Left atrium: The atrium was moderately dilated. - Atrial septum: The septum bowed from left to right, consistent   with increased left atrial pressure. No defect or patent foramen   ovale was identified. - Tricuspid valve: There was mild regurgitation.  Patient Profile     51 y.o. female with history of tobacco use presents with acute anterior STEMI complicated by Vfib requiring cardioversion x 3.   Assessment & Plan    1. Anterior STEMI. S/p emergent stenting of the proximal LAD with heavy thrombus burden. On DAPT. Completed 18 hours of IV Aggrastat. Currently angina free. Significant LV dysfunction by Echo. Will start ACEi and beta blocker today. May add aldactone later if BP tolerates 2. Ventricular fibrillation secondary to # 1. Some NSVT last night. Start beta blocker. Potassium repleted. Given LV dysfunction will need Lifevest at discharge. Although cholesterol is good will treat with statin for anti-inflammatory effects.  3. Ischemic cardiomyopathy with EF 25-30%. Will start ACEi and Coreg. Adjust as BP allows.   4. Hypokalemia. repleted 5. Tobacco abuse. Patient motivated to quit.  For questions or updates, please contact [facility] Please consult [email or web address] for contact info under Cardiology/STEMI.      Signed, Peter [place], MD,FACC  11/30/2017, 8:10 AM

## 2017-11-30 NOTE — Progress Notes (Signed)
CARDIAC REHAB PHASE I   PRE:  Rate/Rhythm: 95 SR  BP:  Sitting: 109/80        SaO2: 99 RA  MODE:  Ambulation: 370 ft   POST:  Rate/Rhythm: 88 SR  BP:  Sitting: 109/75         SaO2: 99 RA  Pt ambulated 370 ft on RA, handheld assist, steady gait, tolerated well with no complaints. Began MI/stent education with pt and husband at bedside.  Reviewed risk factors, tobacco cessation, ETOH (pt states she drinks four drinks daily), MI book, anti-platelet therapy, stent card, activity restrictions, ntg, CHF booklet and zone tool, daily weights and phase 2 cardiac rehab. Left exercise guidelines and heart healthy diet handout, will review tomorrow. Pt and husband verbalized understanding. Pt states she is overwhelmed. Pt agrees to phase 2 cardiac rehab referral, will send to [place] per pt request. Pt to see case manager regarding brilinta prior to discharge. Pt to bed per pt request after walk, call bell within reach. Will follow.     [phone] Joylene Grapes, RN, BSN 11/30/2017 12:11 PM

## 2017-12-01 ENCOUNTER — Encounter (HOSPITAL_COMMUNITY): Payer: Self-pay | Admitting: General Practice

## 2017-12-01 DIAGNOSIS — I2102 ST elevation (STEMI) myocardial infarction involving left anterior descending coronary artery: Secondary | ICD-10-CM | POA: Diagnosis not present

## 2017-12-01 DIAGNOSIS — I4901 Ventricular fibrillation: Secondary | ICD-10-CM | POA: Diagnosis not present

## 2017-12-01 NOTE — Progress Notes (Addendum)
Patient is to go home with Life Vest; Tresa Endo with [employer] is aware and will be in this afternoon for his fitting;  Brilinta benefit check is in progress. Abelino Derrick RN,MHA,BSN [phone]  12:00 pm -  Dellia Cloud CMA        Brilinta: 90 day mail order: $125  30 day retail: $177.47  No auth required

## 2017-12-01 NOTE — Progress Notes (Signed)
CARDIAC REHAB PHASE I   PRE:  Rate/Rhythm: 99 SR  BP:  Sitting: 89/52        SaO2: 98 RA  MODE:  Ambulation: 470 ft   POST:  Rate/Rhythm: 88 SR  BP:  Sitting: 100/56         SaO2: 100 RA  Pt ambulated 470 ft on RA, hand held assist, steady gait, tolerated well with no complaints. Reviewed yesterday's education with pt and husband. Discussed exercise guidelines, diet. Pt and husband verbalized understanding. Pt overwhelmed, tearful, emotional support given to pt. Encouraged additional ambulation today. Case manager to see pt regarding brilinta, life vest. Pt to bed per pt request after walk, call bell within reach. Will follow.   [phone] Joylene Grapes, RN, BSN 12/01/2017 11:06 AM

## 2017-12-01 NOTE — Progress Notes (Signed)
Progress Note  Patient Name: Sarah Fox Date of Encounter: 12/01/2017  Primary Cardiologist: New- Verdis PrimeHenry Smith MD  Subjective   Feels well this am. No chest pain.  No dyspnea.   Inpatient Medications    Scheduled Meds: . aspirin  81 mg Oral Daily  . atorvastatin  80 mg Oral q1800  . carvedilol  3.125 mg Oral BID WC  . heparin  5,000 Units Subcutaneous Q8H  . lisinopril  2.5 mg Oral Daily  . pantoprazole  40 mg Oral Daily  . sodium chloride flush  3 mL Intravenous Q12H  . ticagrelor  90 mg Oral BID   Continuous Infusions: . sodium chloride     PRN Meds: sodium chloride, acetaminophen, diazepam, morphine injection, ondansetron (ZOFRAN) IV, oxyCODONE, sodium chloride flush   Vital Signs    Vitals:   11/30/17 2045 12/01/17 0023 12/01/17 0233 12/01/17 0555  BP: (!) 93/47 (!) 94/56  (!) 92/55  Pulse: 77 73  80  Resp: 18 20  18   Temp: 97.9 F (36.6 C) 98 F (36.7 C)  98.8 F (37.1 C)  TempSrc: Oral Oral  Oral  SpO2: 99% 100%  98%  Weight:   119 lb 9.6 oz (54.3 kg)   Height:        Intake/Output Summary (Last 24 hours) at 12/01/2017 0817 Last data filed at 12/01/2017 0530 Gross per 24 hour  Intake 240 ml  Output 900 ml  Net -660 ml   Filed Weights   11/29/17 0900 11/30/17 1730 12/01/17 0233  Weight: 120 lb (54.4 kg) 120 lb (54.4 kg) 119 lb 9.6 oz (54.3 kg)    Telemetry    NSR with one brief 11 beat run of SVT.   Personally Reviewed  ECG    None today.  Physical Exam   GENERAL:  Well appearing WF in NAD HEENT:  PERRL, EOMI, sclera are clear. Oropharynx is clear. NECK:  No jugular venous distention, carotid upstroke brisk and symmetric, no bruits, no thyromegaly or adenopathy LUNGS:  Clear to auscultation bilaterally CHEST:  Unremarkable HEART:  RRR,  PMI not displaced or sustained,S1 and S2 within normal limits, no S3, no S4: no clicks, no rubs, no murmurs ABD:  Soft, nontender. BS +, no masses or bruits. No hepatomegaly, no splenomegaly EXT:  2  + pulses throughout, no edema, no cyanosis no clubbing SKIN:  Warm and dry.  No rashes NEURO:  Alert and oriented x 3. Cranial nerves II through XII intact. PSYCH:  Cognitively intact     Labs    Chemistry Recent Labs  Lab 11/29/17 0733 11/29/17 0745 11/30/17 0012 11/30/17 0625  NA 137 140 137 138  K 2.9* 3.0* 3.9 4.1  CL 106 104 108 107  CO2 21*  --  20* 21*  GLUCOSE 146* 148* 100* 103*  BUN 7 9 <5* <5*  CREATININE 0.66 0.60 0.73 0.74  CALCIUM 8.3*  --  8.7* 8.7*  PROT 5.8*  --   --   --   ALBUMIN 3.5  --   --   --   AST 20  --   --   --   ALT 14  --   --   --   ALKPHOS 44  --   --   --   BILITOT 0.4  --   --   --   GFRNONAA >60  --  >60 >60  GFRAA >60  --  >60 >60  ANIONGAP 10  --  9 10  Hematology Recent Labs  Lab 11/29/17 0733 11/29/17 0745 11/30/17 0625  WBC 11.2*  --  12.0*  RBC 3.95  --  4.24  HGB 12.4 12.6 13.4  HCT 36.9 37.0 40.0  MCV 93.4  --  94.3  MCH 31.4  --  31.6  MCHC 33.6  --  33.5  RDW 12.7  --  13.1  PLT 303  --  288    Cardiac Enzymes Recent Labs  Lab 11/29/17 0733 11/29/17 1820 11/30/17 0012 11/30/17 0625  TROPONINI 0.03* 26.02* 17.23* 11.16*   No results for input(s): TROPIPOC in the last 168 hours.   BNPNo results for input(s): BNP, PROBNP in the last 168 hours.   DDimer No results for input(s): DDIMER in the last 168 hours.   Radiology    No results found.  Cardiac Studies   Procedures   Coronary/Graft Acute MI Revascularization  CORONARY STENT INTERVENTION  LEFT HEART CATH AND CORONARY ANGIOGRAPHY  Conclusion    Acute anterior myocardial infarction due to occlusion of the proximal LAD with 99% thrombus contained obstruction with TIMI grade II flow on initial angiography.  Ventricular fibrillation requiring electrical conversion x3 in succession prior to PCI.  CPR was not necessary.  Hypokalemia of 3.0 was identified.  Successful LAD PCI following aspiration thrombectomy reducing 99% stenosis with TIMI  grade II antegrade flow to 0% with TIMI grade III flow.  The 3.5 x 22 mm Onyx was postdilated to 3.75 mm at high pressure.  Normal right coronary artery.  Normal left main coronary artery.  Normal circumflex coronary artery.  Acute diastolic heart failure documented by moderate elevation in LVEDP.  RECOMMENDATIONS:   Aggrastat times 18 hours  Aspirin and Brilinta times 12 months  Risk factor modification as needed: Hemoglobin A1c, lipid panel, have been ordered.  Beta-blocker and ACE inhibitor therapy should be started once hemodynamics were stable   Echo: Study Conclusions  - Left ventricle: The cavity size was normal. Wall thickness was   normal. Systolic function was severely reduced. The estimated   ejection fraction was in the range of 25% to 30%. Features are   consistent with a pseudonormal left ventricular filling pattern,   with concomitant abnormal relaxation and increased filling   pressure (grade 2 diastolic dysfunction). Doppler parameters are   consistent with high ventricular filling pressure. - Regional wall motion abnormality: Akinesis of the apical   anterior, apical inferior, apical septal, apical lateral, and   apical myocardium; severe hypokinesis of the mid anterior, mid   anteroseptal, basal-mid inferoseptal, and mid anterolateral   myocardium. - Mitral valve: There was mild regurgitation. - Left atrium: The atrium was moderately dilated. - Atrial septum: The septum bowed from left to right, consistent   with increased left atrial pressure. No defect or patent foramen   ovale was identified. - Tricuspid valve: There was mild regurgitation.  Patient Profile     51 y.o. female with history of tobacco use presents with acute anterior STEMI complicated by Vfib requiring cardioversion x 3.   Assessment & Plan    1. Anterior STEMI. S/p emergent stenting of the proximal LAD with heavy thrombus burden. On DAPT.  Currently angina free. Significant LV  dysfunction by Echo. On low dose ACEi and beta blocker. Titration limited by low BP. May add aldactone later if BP tolerates 2. Ventricular fibrillation secondary to # 1.  On low dose beta blocker. Potassium repleted. Given LV dysfunction will need Lifevest at discharge. Although cholesterol is good will treat  with statin for anti-inflammatory effects.  3. Ischemic cardiomyopathy with EF 25-30%. Will start ACEi and Coreg. Adjust as BP allows.  4. Hypokalemia. repleted 5. Tobacco abuse. Patient motivated to quit.  Anticipate DC tomorrow if she has a good day today.  For questions or updates, please contact [facility] Please consult [email or web address] for contact info under Cardiology/STEMI.      Signed, Peter [place], MD,FACC  12/01/2017, 8:17 AM

## 2017-12-01 NOTE — Progress Notes (Signed)
    Paperwork completed and submitted for Lifevest. Rep contacted.   Janice Coffin, NP-C 12/01/2017, 11:32 AM Pager: [phone]

## 2017-12-02 ENCOUNTER — Telehealth: Payer: Self-pay | Admitting: Cardiology

## 2017-12-02 MED ORDER — TICAGRELOR 90 MG PO TABS: 90.0000 mg | ORAL_TABLET | Freq: Two times a day (BID) | ORAL | 11 refills | Status: DC

## 2017-12-02 MED ORDER — CARVEDILOL 3.125 MG PO TABS: 3.1250 mg | ORAL_TABLET | Freq: Two times a day (BID) | ORAL | 6 refills | Status: DC

## 2017-12-02 MED ORDER — ASPIRIN EC 81 MG PO TBEC: 81.0000 mg | DELAYED_RELEASE_TABLET | Freq: Every day | ORAL | Status: AC

## 2017-12-02 MED ORDER — ATORVASTATIN CALCIUM 80 MG PO TABS: 80.0000 mg | ORAL_TABLET | Freq: Every day | ORAL | 3 refills | Status: DC

## 2017-12-02 MED ORDER — NITROGLYCERIN 0.4 MG SL SUBL: 0.4000 mg | SUBLINGUAL_TABLET | SUBLINGUAL | 12 refills | Status: DC | PRN

## 2017-12-02 MED ORDER — LISINOPRIL 2.5 MG PO TABS: 2.5000 mg | ORAL_TABLET | Freq: Every day | ORAL | 6 refills | Status: DC

## 2017-12-02 NOTE — Progress Notes (Signed)
Brilinta coupon card given to patient with explanation of usage; all questions answered; Alexis Goodell [phone]

## 2017-12-02 NOTE — Telephone Encounter (Signed)
TOC Patient-Please call Patient-Pt has an apointment with Nada Boozer.

## 2017-12-02 NOTE — Progress Notes (Signed)
CARDIAC REHAB PHASE I   Pt just finished washing up in room, eager for discharge, declines ambulation at this time. Reinforced education, answered pt and pt's husbands questions. Phase 2 referral sent to [place]. Case manager to see regarding brilinta prior to discharge. Pt in bed, call bell within reach.   [phone] Joylene Grapes, RN, BSN 12/02/2017 11:30 AM

## 2017-12-02 NOTE — Plan of Care (Signed)
  Progressing Education: Knowledge of General Education information will improve 12/02/2017 0120 - Progressing by Olive Bass, RN Health Behavior/Discharge Planning: Ability to manage health-related needs will improve 12/02/2017 0120 - Progressing by Olive Bass, RN Clinical Measurements: Ability to maintain clinical measurements within normal limits will improve 12/02/2017 0120 - Progressing by Olive Bass, RN Diagnostic test results will improve 12/02/2017 0120 - Progressing by Olive Bass, RN Cardiovascular complication will be avoided 12/02/2017 0120 - Progressing by Olive Bass, RN Skin Integrity: Risk for impaired skin integrity will decrease 12/02/2017 0120 - Progressing by Olive Bass, RN Education: Understanding of cardiac disease, CV risk reduction, and recovery process will improve 12/02/2017 0120 - Progressing by Olive Bass, RN Understanding of medication regimen will improve 12/02/2017 0120 - Progressing by Olive Bass, RN Cardiac: Ability to achieve and maintain adequate cardiopulmonary perfusion will improve 12/02/2017 0120 - Progressing by Olive Bass, RN Education: Understanding of cardiac disease, CV risk reduction, and recovery process will improve 12/02/2017 0120 - Progressing by Olive Bass, RN Cardiac: Ability to achieve and maintain adequate cardiopulmonary perfusion will improve 12/02/2017 0120 - Progressing by Olive Bass, RN

## 2017-12-02 NOTE — Discharge Summary (Signed)
Discharge Summary    Patient ID: Sarah Fox,  MRN: [id number], DOB/AGE: 02-Aug-1966 51 y.o.  Admit date: 11/29/2017 Discharge date: 12/02/2017  Primary Care Provider: Jannifer Rodney A Primary Cardiologist: Clayton Bibles MD  Discharge Diagnoses    Active Problems:   Acute ST elevation myocardial infarction (STEMI) of anterior wall (HCC)   CAD in native artery   VF (ventricular fibrillation) (HCC)   Acute ST elevation myocardial infarction (STEMI) involving left anterior descending (LAD) coronary artery (HCC)   Tobacco smoking   Allergies No Known Allergies  Diagnostic Studies/Procedures    Sarah Fox is a 51 y.o. female with a hx of gastroesophageal reflux who while preparing for work this morning after a shower, developed crushing chest discomfort and eventually called EMS.  An EKG was transmitted that demonstrated anterior ST elevation.  The patient has no prior history of cardiac disease.  The discomfort is characterized as severe precordial and extending into the interscapular region.  Smokes 1/2-1 pack of cigarettes per day for greater than 20 years, positive family history of CAD.  She is not diabetic and has no history of hypertension.   History of Present Illness     Procedures   Coronary/Graft Acute MI Revascularization  CORONARY STENT INTERVENTION  LEFT HEART CATH AND CORONARY ANGIOGRAPHY  Conclusion    Acute anterior myocardial infarction due to occlusion of the proximal LAD with 99% thrombus contained obstruction with TIMI grade II flow on initial angiography.  Ventricular fibrillation requiring electrical conversion x3 in succession prior to PCI. CPR was not necessary. Hypokalemia of 3.0 was identified.  Successful LAD PCI following aspiration thrombectomy reducing 99% stenosis with TIMI grade II antegrade flow to 0% with TIMI grade III flow. The 3.5 x 22 mm Onyx was postdilated to 3.75 mm at high pressure.  Normal right coronary  artery.  Normal left main coronary artery.  Normal circumflex coronary artery.  Acute diastolic heart failure documented by moderate elevation in LVEDP.  RECOMMENDATIONS:   Aggrastat times 18 hours  Aspirin and Brilinta times 12 months  Risk factor modification as needed: Hemoglobin A1c, lipid panel, have been ordered.  Beta-blocker and ACE inhibitor therapy should be started once hemodynamics were stable   Echo: Study Conclusions  - Left ventricle: The cavity size was normal. Wall thickness was normal. Systolic function was severely reduced. The estimated ejection fraction was in the range of 25% to 30%. Features are consistent with a pseudonormal left ventricular filling pattern, with concomitant abnormal relaxation and increased filling pressure (grade 2 diastolic dysfunction). Doppler parameters are consistent with high ventricular filling pressure. - Regional wall motion abnormality: Akinesis of the apical anterior, apical inferior, apical septal, apical lateral, and apical myocardium; severe hypokinesis of the mid anterior, mid anteroseptal, basal-mid inferoseptal, and mid anterolateral myocardium. - Mitral valve: There was mild regurgitation. - Left atrium: The atrium was moderately dilated. - Atrial septum: The septum bowed from left to right, consistent with increased left atrial pressure. No defect or patent foramen ovale was identified. - Tricuspid valve: There was mild regurgitation.   Hospital Course     Consultants: None  1. Anterior STEMI.  -S/p emergent stenting of the proximal LAD with heavy thrombus burden.  Significant LV dysfunction by Echo. No recurrent chest pain. Ambulated well. Peak of troponin 26.02. Continue DAPT and statin.   2. Ventricular fibrillation secondary to # 1.   - On low dose beta blocker. Potassium repleted. Given LV dysfunction, provided Lifevest at discharge.  3. Ischemic cardiomyopathy with EF  25-30%. -On ACEi and Coreg. . Titration limited by low BP. May add aldactone later if BP tolerates.  4. Hypokalemia.  - Resolved  5. Tobacco abuse.  - Patient motivated to quit.  6. HLD - 11/30/2017: Cholesterol 133; HDL 58; LDL Cholesterol 63; Triglycerides 61; VLDL 12 - Although cholesterol is good will treat with statin for anti-inflammatory effects.   The patient has been seen by Dr. [place]  today and deemed ready for discharge home. All follow-up appointments have been scheduled. Discharge medications are listed below.    Discharge Vitals Blood pressure 96/70, pulse 73, temperature 97.7 F (36.5 C), temperature source Oral, resp. rate 18, height 5\' 5"  (1.651 m), weight 119 lb 9.6 oz (54.3 kg), SpO2 100 %.  Filed Weights   11/30/17 1730 12/01/17 0233 12/02/17 0453  Weight: 120 lb (54.4 kg) 119 lb 9.6 oz (54.3 kg) 119 lb 9.6 oz (54.3 kg)   Physical Exam  Constitutional: She is oriented to person, place, and time. She appears well-developed and well-nourished.  HENT:  Head: Normocephalic and atraumatic.  Eyes: EOM are normal. Pupils are equal, round, and reactive to light.  Neck: Normal range of motion.  Cardiovascular: Normal rate and regular rhythm.  Cath site stable  Pulmonary/Chest: Effort normal and breath sounds normal.  Abdominal: Soft. Bowel sounds are normal.  Musculoskeletal: Normal range of motion.  Neurological: She is alert and oriented to person, place, and time.  Skin: Skin is warm and dry.  Psychiatric: She has a normal mood and affect.   Labs & Radiologic Studies     CBC Recent Labs    11/30/17 0625  WBC 12.0*  HGB 13.4  HCT 40.0  MCV 94.3  PLT 288   Basic Metabolic Panel Recent Labs    40/98/11 0012 11/30/17 0625  NA 137 138  K 3.9 4.1  CL 108 107  CO2 20* 21*  GLUCOSE 100* 103*  BUN <5* <5*  CREATININE 0.73 0.74  CALCIUM 8.7* 8.7*  MG  --  1.8   Liver Function Tests No results for input(s): AST, ALT, ALKPHOS, BILITOT, PROT,  ALBUMIN in the last 72 hours. No results for input(s): LIPASE, AMYLASE in the last 72 hours. Cardiac Enzymes Recent Labs    11/29/17 1820 11/30/17 0012 11/30/17 0625  TROPONINI 26.02* 17.23* 11.16*   BNP Invalid input(s): POCBNP D-Dimer No results for input(s): DDIMER in the last 72 hours. Hemoglobin A1C Recent Labs    11/30/17 0012  HGBA1C 4.9   Fasting Lipid Panel Recent Labs    11/30/17 0012  CHOL 133  HDL 58  LDLCALC 63  TRIG 61  CHOLHDL 2.3   Thyroid Function Tests No results for input(s): TSH, T4TOTAL, T3FREE, THYROIDAB in the last 72 hours.  Invalid input(s): FREET3  No results found.  Disposition   Pt is being discharged home today in good condition.  Follow-up Plans & Appointments    Follow-up Information    Leone Brand, NP. Go on 12/13/2017.   Specialties:  Cardiology, Radiology Why:  @2 :30pm for hospital follow up  Contact information: [address] [place] [address] [phone]          Discharge Instructions    Amb Referral to Cardiac Rehabilitation   Complete by:  As directed    Diagnosis:   Coronary Stents STEMI     Diet - low sodium heart healthy   Complete by:  As directed    Discharge instructions  Complete by:  As directed    No driving for 2 weeks. No lifting over 10 lbs for 4 weeks. No sexual activity for 4 weeks. You may not return to work until cleared by your cardiologist/seen in clinic. Keep procedure site clean & dry. If you notice increased pain, swelling, bleeding or pus, call/return!  You may shower, but no soaking baths/hot tubs/pools for 1 week.   Increase activity slowly   Complete by:  As directed       Discharge Medications   Allergies as of 12/02/2017   No Known Allergies     Medication List    TAKE these medications   aspirin EC 81 MG tablet Take 1 tablet (81 mg total) by mouth daily.   atorvastatin 80 MG tablet Commonly known as:  LIPITOR Take 1 tablet (80 mg total) by mouth  daily at 6 PM.   carvedilol 3.125 MG tablet Commonly known as:  COREG Take 1 tablet (3.125 mg total) by mouth 2 (two) times daily with a meal.   lisinopril 2.5 MG tablet Commonly known as:  PRINIVIL,ZESTRIL Take 1 tablet (2.5 mg total) by mouth daily. Start taking on:  12/03/2017   nitroGLYCERIN 0.4 MG SL tablet Commonly known as:  NITROSTAT Place 1 tablet (0.4 mg total) under the tongue every 5 (five) minutes as needed for chest pain.   omeprazole 40 MG capsule Commonly known as:  PRILOSEC TAKE 1 CAPSULE BY MOUTH EVERY DAY   ticagrelor 90 MG Tabs tablet Commonly known as:  BRILINTA Take 1 tablet (90 mg total) by mouth 2 (two) times daily.        Aspirin prescribed at discharge?  Yes High Intensity Statin Prescribed? (Lipitor 40-80mg  or Crestor 20-40mg ): Yes Beta Blocker Prescribed? Yes For EF 45% or less, Was ACEI/ARB Prescribed? Yes ADP Receptor Inhibitor Prescribed? (i.e. Plavix etc.-Includes Medically Managed Patients): Yes For EF <40%, Aldosterone Inhibitor Prescribed? N/A Was EF assessed during THIS hospitalization? Yes Was Cardiac Rehab II ordered? (Included Medically managed Patients): Yes   Outstanding Labs/Studies   Consider OP f/u labs 6-8 weeks given statin initiation this admission.  Duration of Discharge Encounter   Greater than 30 minutes including physician time.  Signed, Sharrell KuBhavinkumar Lashae Wollenberg PA-C 12/02/2017, 11:40 AM

## 2017-12-05 ENCOUNTER — Ambulatory Visit: Payer: Self-pay | Admitting: Family Medicine

## 2017-12-07 ENCOUNTER — Encounter: Payer: Self-pay | Admitting: Cardiology

## 2017-12-07 NOTE — Telephone Encounter (Signed)
°  New message ° °Pt verbalized that she is returning call for the rn  °

## 2017-12-07 NOTE — Telephone Encounter (Signed)
Patient contacted regarding discharge from  on 12/02/17.  Patient understands to follow up with provider Nada Boozer NP on 12/13/17 at 2:30 pm at [address]. Patient understands discharge instructions? yes Patient understands medications and regiment? yes Patient understands to bring all medications to this visit? yes

## 2017-12-07 NOTE — Telephone Encounter (Signed)
Attempted to contact pt at phone number listed, received recording unable to leave a message, voice mail not set up.

## 2017-12-08 ENCOUNTER — Encounter: Payer: Self-pay | Admitting: Family

## 2017-12-08 ENCOUNTER — Ambulatory Visit: Payer: Federal, State, Local not specified - PPO | Admitting: Family

## 2017-12-08 VITALS — BP 108/72 | HR 70 | Temp 97.0°F | Ht 65.0 in | Wt 125.0 lb

## 2017-12-08 DIAGNOSIS — Z09 Encounter for follow-up examination after completed treatment for conditions other than malignant neoplasm: Secondary | ICD-10-CM | POA: Diagnosis not present

## 2017-12-08 DIAGNOSIS — I2109 ST elevation (STEMI) myocardial infarction involving other coronary artery of anterior wall: Secondary | ICD-10-CM | POA: Diagnosis not present

## 2017-12-08 DIAGNOSIS — I2510 Atherosclerotic heart disease of native coronary artery without angina pectoris: Secondary | ICD-10-CM

## 2017-12-08 DIAGNOSIS — F419 Anxiety disorder, unspecified: Secondary | ICD-10-CM | POA: Diagnosis not present

## 2017-12-08 MED ORDER — ESCITALOPRAM OXALATE 10 MG PO TABS: 10.0000 mg | ORAL_TABLET | Freq: Every day | ORAL | 3 refills | Status: DC

## 2017-12-08 NOTE — Patient Instructions (Addendum)

## 2017-12-08 NOTE — Progress Notes (Signed)
   Subjective:    Patient ID: Sarah Fox, female    DOB: 1966/04/24, 51 y.o.   MRN: [id number]  HPI PT presents to the office today for hospital follow up. PT went to the ED on 11/29/17 for chest pain and was diagnosed with acute STEMI and Ventricular Fibrillation.   PT has follow up Cardiologists appt 12/13/17. Pt states she has not smoked since being discharged. Pt currently taking ACE, statin, Brinlinta, and aspirin.   PT states her anxiety and depression is worse since stopping smoking and drinking. PT states her diet has changed to salads  and this had also increased her anxiety.     Review of Systems  Constitutional: Negative for fatigue.  Respiratory: Negative for shortness of breath.   All other systems reviewed and are negative.      Objective:   Physical Exam  Constitutional: She is oriented to person, place, and time. She appears well-developed and well-nourished. No distress.  HENT:  Head: Normocephalic and atraumatic.  Right Ear: External ear normal.  Left Ear: External ear normal.  Nose: Nose normal.  Mouth/Throat: Oropharynx is clear and moist.  Eyes: Pupils are equal, round, and reactive to light.  Neck: Normal range of motion. Neck supple. No thyromegaly present.  Cardiovascular: Normal rate, regular rhythm, normal heart sounds and intact distal pulses.  No murmur heard. Pulmonary/Chest: Effort normal and breath sounds normal. No respiratory distress. She has no wheezes.  Abdominal: Soft. Bowel sounds are normal. She exhibits no distension. There is no tenderness.  Musculoskeletal: Normal range of motion. She exhibits no edema or tenderness.  Neurological: She is alert and oriented to person, place, and time.  Skin: Skin is warm and dry.  Psychiatric: She has a normal mood and affect. Her behavior is normal. Judgment and thought content normal.  Vitals reviewed.     BP 108/72   Pulse 70   Temp (!) 97 F (36.1 C)   Ht _0  (1.651 m)   Wt 125 lb  (56.7 kg)   BMI 20.80 kg/m      Assessment & Plan:  1. Acute ST elevation myocardial infarction (STEMI) of anterior wall (HCC) - CMP14+EGFR - CBC with Differential/Platelet  2. CAD in native artery - CMP14+EGFR - CBC with Differential/Platelet  3. Hospital discharge follow-up - CMP14+EGFR - CBC with Differential/Platelet  4. Anxiety Lexapro 10 mg started today Stress management discussed - escitalopram (LEXAPRO) 10 MG tablet; Take 1 tablet (10 mg total) by mouth daily.  Dispense: 90 tablet; Refill: 3 - CMP14+EGFR - CBC with Differential/Platelet   Continue all meds Labs pending Health Maintenance reviewed Diet and exercise encouraged RTO 6 months and keep all Cardiologists appts!!  Evelina Dun, FNP

## 2017-12-09 LAB — CMP14+EGFR
ALBUMIN: 4.4 g/dL (ref 3.5–5.5)
ALT: 42 IU/L — AB (ref 0–32)
AST: 23 IU/L (ref 0–40)
Albumin/Globulin Ratio: 2.1 (ref 1.2–2.2)
Alkaline Phosphatase: 54 IU/L (ref 39–117)
BUN / CREAT RATIO: 15 (ref 9–23)
BUN: 9 mg/dL (ref 6–24)
CALCIUM: 9.1 mg/dL (ref 8.7–10.2)
CHLORIDE: 103 mmol/L (ref 96–106)
CO2: 24 mmol/L (ref 20–29)
Creatinine, Ser: 0.59 mg/dL (ref 0.57–1.00)
GFR, EST AFRICAN AMERICAN: 124 mL/min/{1.73_m2} (ref >59)
GFR, EST NON AFRICAN AMERICAN: 108 mL/min/{1.73_m2} (ref >59)
GLUCOSE: 86 mg/dL (ref 65–99)
Globulin, Total: 2.1 g/dL (ref 1.5–4.5)
Potassium: 4.5 mmol/L (ref 3.5–5.2)
Sodium: 141 mmol/L (ref 134–144)
TOTAL PROTEIN: 6.5 g/dL (ref 6.0–8.5)

## 2017-12-09 LAB — CBC WITH DIFFERENTIAL/PLATELET
BASOS ABS: 0 10*3/uL (ref 0.0–0.2)
BASOS: 1 %
EOS (ABSOLUTE): 0.3 10*3/uL (ref 0.0–0.4)
EOS: 4 %
HEMATOCRIT: 39.1 % (ref 34.0–46.6)
HEMOGLOBIN: 13.3 g/dL (ref 11.1–15.9)
IMMATURE GRANS (ABS): 0 10*3/uL (ref 0.0–0.1)
Immature Granulocytes: 0 %
LYMPHS ABS: 2.9 10*3/uL (ref 0.7–3.1)
LYMPHS: 32 %
MCH: 31.5 pg (ref 26.6–33.0)
MCHC: 34 g/dL (ref 31.5–35.7)
MCV: 93 fL (ref 79–97)
MONOCYTES: 9 %
Monocytes Absolute: 0.8 10*3/uL (ref 0.1–0.9)
NEUTROS ABS: 4.9 10*3/uL (ref 1.4–7.0)
Neutrophils: 54 %
Platelets: 362 10*3/uL (ref 150–379)
RBC: 4.22 x10E6/uL (ref 3.77–5.28)
RDW: 13.3 % (ref 12.3–15.4)
WBC: 8.8 10*3/uL (ref 3.4–10.8)

## 2017-12-13 ENCOUNTER — Encounter: Payer: Self-pay | Admitting: Cardiology

## 2017-12-13 ENCOUNTER — Ambulatory Visit: Payer: Federal, State, Local not specified - PPO | Admitting: Cardiology

## 2017-12-13 VITALS — BP 90/50 | HR 65 | Resp 16 | Ht 65.0 in | Wt 120.4 lb

## 2017-12-13 DIAGNOSIS — E782 Mixed hyperlipidemia: Secondary | ICD-10-CM

## 2017-12-13 DIAGNOSIS — I4901 Ventricular fibrillation: Secondary | ICD-10-CM

## 2017-12-13 DIAGNOSIS — I959 Hypotension, unspecified: Secondary | ICD-10-CM | POA: Diagnosis not present

## 2017-12-13 DIAGNOSIS — I2510 Atherosclerotic heart disease of native coronary artery without angina pectoris: Secondary | ICD-10-CM | POA: Diagnosis not present

## 2017-12-13 DIAGNOSIS — I255 Ischemic cardiomyopathy: Secondary | ICD-10-CM | POA: Diagnosis not present

## 2017-12-13 DIAGNOSIS — I213 ST elevation (STEMI) myocardial infarction of unspecified site: Secondary | ICD-10-CM | POA: Diagnosis not present

## 2017-12-13 MED ORDER — LISINOPRIL 2.5 MG PO TABS: 1.2500 mg | ORAL_TABLET | Freq: Every day | ORAL | 6 refills | Status: DC
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
## 2017-12-13 NOTE — Patient Instructions (Addendum)
Medication Instructions:   START TAKING  ( 1/2 TABLET OF  2.5 MG ) 1.25 MG  LISINOPRIL  ONCE A DAY   If you need a refill on your cardiac medications before your next appointment, please call your pharmacy.  Labwork: NONE ORDERED  TODAY    Testing/Procedures:  NONE ORDERED  TODAY   Follow-Up: IN 4 WEEKS WITH INGOLD OR SMITH     Any Other Special Instructions Will Be Listed Below (If Applicable).

## 2017-12-14 NOTE — Progress Notes (Signed)
I think she should have an echo repeated relatively soon.  She will probably be one where the EF improves rapidly.  If EF gets above 35% the LifeVest can be discontinued.  I do not think she should drive wearing the LifeVest.

## 2017-12-15 ENCOUNTER — Telehealth: Payer: Self-pay | Admitting: *Deleted

## 2017-12-15 DIAGNOSIS — I2109 ST elevation (STEMI) myocardial infarction involving other coronary artery of anterior wall: Secondary | ICD-10-CM

## 2017-12-15 DIAGNOSIS — I4901 Ventricular fibrillation: Secondary | ICD-10-CM

## 2017-12-15 NOTE — Telephone Encounter (Signed)
-----   Message from Leone Brand, NP sent at 12/15/2017  8:07 AM EST ----- Victorino Dike can we do echo in 2 weeks and let her know -  No driving until we can safely stop the life vest.  Thanks.   ----- Message ----- From: Lyn Records, MD Sent: 12/14/2017   8:55 AM To: Leone Brand, NP, Julio Sicks, LPN    ----- Message ----- From: Leone Brand, NP Sent: 12/13/2017   5:37 PM To: Lyn Records, MD  I saw Ms. Eckart with ant STEMI and V fib on table with 3 shocks and hypokalemia.  Her EF is 25%.  Today euvolemic.  She is wearing a life vest.  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  Today her BP was 80 systolic so I decreased her ACE.   She is feeling well. Thanks. Vernona Rieger

## 2017-12-15 NOTE — Telephone Encounter (Signed)
Attempted to contact pt. VM has not been set up.  Will try again later.

## 2017-12-15 NOTE — Telephone Encounter (Signed)
Spoke with pt and advised her of need for echo and not to drive while wearing life vest.  Advised I would send message to scheduler to get echo scheduled.  Pt verbalized understanding and was in agreement with this plan.

## 2017-12-15 NOTE — Telephone Encounter (Signed)
Tried reach pt, no voicemail set up. Spoke with husband, Kathlene November, [place] on file, he will have pt call and set up the Echo (order already in).  Husband has also been advised to let the pt know that she should not drive until they are able to get the Life Vest safely stopped. He verbalized understanding and will have pt call.

## 2017-12-16 ENCOUNTER — Telehealth (HOSPITAL_COMMUNITY): Payer: Self-pay | Admitting: Cardiology

## 2017-12-16 NOTE — Telephone Encounter (Addendum)
Patient has been scheduled for her echocardiogram on 12/28/17 at 10:30 AM. Called to make sure patient was aware of the appointment and did not have any questions. Patient states that she is aware of her echo appointment on 12/28/17 and that she needs to be here at 10:00 AM. Patient denies having any questions and thanked me for the call.

## 2017-12-21 DIAGNOSIS — I2510 Atherosclerotic heart disease of native coronary artery without angina pectoris: Secondary | ICD-10-CM | POA: Insufficient documentation

## 2017-12-21 DIAGNOSIS — K219 Gastro-esophageal reflux disease without esophagitis: Secondary | ICD-10-CM | POA: Insufficient documentation

## 2017-12-21 DIAGNOSIS — T7840XA Allergy, unspecified, initial encounter: Secondary | ICD-10-CM | POA: Insufficient documentation

## 2017-12-21 NOTE — Telephone Encounter (Signed)
User: Trina Ao A Date/time: 12/16/17 3:30 PM  Comment: I called patient, the phone went straight to Vm but no VM was set up.  Context:  Outcome: No Answer/Busy  Phone number: [phone] Phone Type: Home Phone  Comm. type: Telephone Call type: Outgoing  Contact: Sherilyn Dacosta Doss Relation to patient: Self

## 2017-12-28 ENCOUNTER — Other Ambulatory Visit: Payer: Self-pay

## 2017-12-28 ENCOUNTER — Other Ambulatory Visit: Payer: Self-pay | Admitting: Cardiology

## 2017-12-28 ENCOUNTER — Ambulatory Visit (HOSPITAL_COMMUNITY): Payer: Federal, State, Local not specified - PPO | Attending: Cardiovascular Disease

## 2017-12-28 DIAGNOSIS — I071 Rheumatic tricuspid insufficiency: Secondary | ICD-10-CM | POA: Diagnosis not present

## 2017-12-28 DIAGNOSIS — I2109 ST elevation (STEMI) myocardial infarction involving other coronary artery of anterior wall: Secondary | ICD-10-CM | POA: Diagnosis not present

## 2017-12-28 DIAGNOSIS — I252 Old myocardial infarction: Secondary | ICD-10-CM | POA: Insufficient documentation

## 2017-12-28 DIAGNOSIS — I2510 Atherosclerotic heart disease of native coronary artery without angina pectoris: Secondary | ICD-10-CM | POA: Diagnosis not present

## 2017-12-28 DIAGNOSIS — I4901 Ventricular fibrillation: Secondary | ICD-10-CM | POA: Insufficient documentation

## 2017-12-28 DIAGNOSIS — F17200 Nicotine dependence, unspecified, uncomplicated: Secondary | ICD-10-CM | POA: Insufficient documentation

## 2017-12-29 ENCOUNTER — Telehealth: Payer: Self-pay | Admitting: Cardiology

## 2017-12-29 NOTE — Telephone Encounter (Signed)
Returned pts call and she has been made aware of her echo results and that she can stop the Lifevest and return. Pt thanked me for the call and verbalized understanding.

## 2017-12-29 NOTE — Telephone Encounter (Signed)
New message ° ° ° °Patient calling for results of echo. Please call °

## 2018-01-03 ENCOUNTER — Ambulatory Visit: Payer: Federal, State, Local not specified - PPO | Attending: Family Medicine | Admitting: Physical Therapy

## 2018-01-03 DIAGNOSIS — M25512 Pain in left shoulder: Secondary | ICD-10-CM | POA: Insufficient documentation

## 2018-01-03 DIAGNOSIS — M25521 Pain in right elbow: Secondary | ICD-10-CM | POA: Diagnosis not present

## 2018-01-03 DIAGNOSIS — M6281 Muscle weakness (generalized): Secondary | ICD-10-CM | POA: Insufficient documentation

## 2018-01-03 DIAGNOSIS — M25612 Stiffness of left shoulder, not elsewhere classified: Secondary | ICD-10-CM | POA: Diagnosis not present

## 2018-01-03 NOTE — Therapy (Signed)
[facility] [address] [place], [place], [address] Phone: [phone]   Fax:  [phone]  Physical Therapy Treatment  Patient Details  Name: Sarah Fox MRN: [id number] Date of Birth: 07/25/1966 Referring Provider: Dr. Norton Blizzard   Encounter Date: 01/03/2018  PT End of Session - 01/03/18 1257    Visit Number  7    Number of Visits  15    Date for PT Re-Evaluation  01/31/18    PT Start Time  1300    PT Stop Time  1345    PT Time Calculation (min)  45 min    Activity Tolerance  Patient tolerated treatment well    Behavior During Therapy  [facility] for tasks assessed/performed       Past Medical History:  Diagnosis Date  . Allergy   . Cardiac arrest with ventricular fibrillation (HCC) 10/2017   with acute MI and hypokalemia  . Coronary artery disease   . GERD (gastroesophageal reflux disease)   . Ischemic cardiomyopathy 10/2017  . Myocardial infarction acute (HCC) 10/2017    Past Surgical History:  Procedure Laterality Date  . CORONARY STENT INTERVENTION N/A 11/29/2017   Procedure: CORONARY STENT INTERVENTION;  Surgeon: Lyn Records, MD;  Location: [facility] INVASIVE CV LAB;  Service: Cardiovascular;  Laterality: N/A;  . CORONARY/GRAFT ACUTE MI REVASCULARIZATION N/A 11/29/2017   Procedure: Coronary/Graft Acute MI Revascularization;  Surgeon: Lyn Records, MD;  Location: [facility] INVASIVE CV LAB;  Service: Cardiovascular;  Laterality: N/A;  . LEFT HEART CATH AND CORONARY ANGIOGRAPHY N/A 11/29/2017   Procedure: LEFT HEART CATH AND CORONARY ANGIOGRAPHY;  Surgeon: Lyn Records, MD;  Location: [facility] INVASIVE CV LAB;  Service: Cardiovascular;  Laterality: N/A;  . LUMBAR DISC SURGERY     L5-S1    There were no vitals filed for this visit.  Subjective Assessment - 01/03/18 1302    Subjective  Patient presents today after a one month break after having MI and having stent procedure on 11/29/17. Now she reports she only has isolated left  shoulder pain. Returns to heart MD 01/10/18.    Pertinent History  R elbow pain    Patient Stated Goals  to get rid of shoulder pain    Currently in Pain?  Yes    Pain Score  1     Pain Location  Shoulder    Pain Orientation  Lower    Pain Descriptors / Indicators  Sore    Pain Type  Acute pain    Pain Onset  More than a month ago    Pain Frequency  Intermittent    Aggravating Factors   horizontal ADD, mild with ER and IR    Pain Relieving Factors  rest    Effect of Pain on Daily Activities  pain with certain movements         [facility] PT Assessment - 01/03/18 0001      AROM   AROM Assessment Site  Shoulder    Right/Left Shoulder  Left    Left Shoulder Flexion  160 Degrees    Left Shoulder ABduction  165 Degrees    Left Shoulder Internal Rotation  60 Degrees      PROM   Left Shoulder Flexion  172 Degrees    Left Shoulder Internal Rotation  67 Degrees      Strength   Overall Strength Comments  L shoulder 5/5      Palpation   Palpation comment  L pecs, L biceps and deltoids  increased tone and tenderness                  [facility] Adult PT Treatment/Exercise - 01/03/18 0001      Manual Therapy   Manual Therapy  Soft tissue mobilization    Soft tissue mobilization  Manual and IASTM to L pectorals, deltoids, biceps and triceps, upper arm and anterior forearm, subscapularis and lateral scapular muscles                  PT Long Term Goals - 01/03/18 1407      PT LONG TERM GOAL #1   Title  Independent with a HEP.    Time  6    Period  Weeks    Status  Achieved      PT LONG TERM GOAL #2   Title  Patient able to work with pain <= 1/10 in her L shoulder.    Baseline  up to 3/10    Time  6    Period  Weeks    Status  On-going      PT LONG TERM GOAL #3   Title  Patient to report no neurologic sx into LUE with ADLS.    Time  6    Period  Weeks    Status  Achieved      PT LONG TERM GOAL #4   Title  Increase L shoulder strength to 5/5 without pain.     Time  6    Period  Weeks    Status  Achieved            Plan - 01/03/18 1510    Clinical Impression Statement  Patient presents today after a one month break after having MI and having stent procedure on 11/29/17. She was re-evaluated for left shoulder pain which has improved but is still limiting. She has pain mainly with horizontal ADD and IR. She still has tightness in flexion and IR and has poor quality of movement wit LUE although MMT is normal. Patient will benefit from PT to address remaining deficits.    Rehab Potential  Excellent    PT Frequency  2x / week    PT Duration  4 weeks    PT Treatment/Interventions  ADLs/Self Care Home Management;Cryotherapy;Electrical Stimulation;Moist Heat;Ultrasound;Therapeutic exercise;Neuromuscular re-education;Patient/family education;Manual techniques;Dry needling;Iontophoresis 4mg /ml Dexamethasone;Taping    PT Next Visit Plan  Focus on RC strengthening, subscapularis release, STW to RUE and scapular muscles prn; review RW4 (painfree), scapular stab. DN prn to RC/deltoid/biceps. Modalities prn.    Consulted and Agree with Plan of Care  Patient       Patient will benefit from skilled therapeutic intervention in order to improve the following deficits and impairments:  Pain, Decreased strength, Increased muscle spasms, Decreased range of motion, Postural dysfunction  Visit Diagnosis: Acute pain of left shoulder - Plan: PT plan of care cert/re-cert  Stiffness of left shoulder, not elsewhere classified - Plan: PT plan of care cert/re-cert     Problem List Patient Active Problem List   Diagnosis Date Noted  . GERD (gastroesophageal reflux disease)   . Coronary artery disease   . Allergy   . Acute ST elevation myocardial infarction (STEMI) involving left anterior descending (LAD) coronary artery (HCC) 11/29/2017  . Acute ST elevation myocardial infarction (STEMI) of anterior wall (HCC)   . CAD in native artery   . VF (ventricular  fibrillation) (HCC)   . Myocardial infarction acute (HCC) 10/27/2017  . Ischemic cardiomyopathy 10/27/2017  . Cardiac  arrest with ventricular fibrillation (HCC) 10/27/2017  . Left shoulder pain 09/29/2017  . Right elbow pain 01/24/2017  . Ulcer-like dyspepsia 04/04/2012  . Tobacco use 04/04/2012    Solon Palm PT 01/03/2018, 3:17 PM  [facility] [address] [place], [place], [address] Phone: [phone]   Fax:  [phone]  Name: Sarah Fox MRN: [id number] Date of Birth: [birth year]

## 2018-01-05 ENCOUNTER — Ambulatory Visit: Payer: Federal, State, Local not specified - PPO | Admitting: Physical Therapy

## 2018-01-05 ENCOUNTER — Encounter: Payer: Self-pay | Admitting: Physical Therapy

## 2018-01-05 ENCOUNTER — Telehealth: Payer: Self-pay | Admitting: Interventional Cardiology

## 2018-01-05 DIAGNOSIS — M6281 Muscle weakness (generalized): Secondary | ICD-10-CM | POA: Diagnosis not present

## 2018-01-05 DIAGNOSIS — M25612 Stiffness of left shoulder, not elsewhere classified: Secondary | ICD-10-CM | POA: Diagnosis not present

## 2018-01-05 DIAGNOSIS — M25521 Pain in right elbow: Secondary | ICD-10-CM | POA: Diagnosis not present

## 2018-01-05 DIAGNOSIS — M25512 Pain in left shoulder: Secondary | ICD-10-CM | POA: Diagnosis not present

## 2018-01-05 NOTE — Telephone Encounter (Signed)
Sarah Fox Is returning a call. Thanks

## 2018-01-05 NOTE — Telephone Encounter (Signed)
Spoke with pt and she states she received a call but doesn't have VM so has no idea who called.  Advised I was unable to locate who called specifically but likely was a reminder call about appt.  Reminded pt of appt date and time.  Pt appreciative for call.

## 2018-01-05 NOTE — Therapy (Signed)
[facility] [address] [place], [place], [address] Phone: [phone]   Fax:  [phone]  Physical Therapy Treatment  Patient Details  Name: Sarah Fox MRN: [id number] Date of Birth: 1966/03/07 Referring Provider: Dr. Norton Blizzard   Encounter Date: 01/05/2018  PT End of Session - 01/05/18 1433    Visit Number  8    Number of Visits  15    Date for PT Re-Evaluation  01/31/18    PT Start Time  1435    PT Stop Time  1528    PT Time Calculation (min)  53 min    Activity Tolerance  Patient tolerated treatment well    Behavior During Therapy  [facility] for tasks assessed/performed       Past Medical History:  Diagnosis Date  . Allergy   . Cardiac arrest with ventricular fibrillation (HCC) 10/2017   with acute MI and hypokalemia  . Coronary artery disease   . GERD (gastroesophageal reflux disease)   . Ischemic cardiomyopathy 10/2017  . Myocardial infarction acute (HCC) 10/2017    Past Surgical History:  Procedure Laterality Date  . CORONARY STENT INTERVENTION N/A 11/29/2017   Procedure: CORONARY STENT INTERVENTION;  Surgeon: Lyn Records, MD;  Location: [facility] INVASIVE CV LAB;  Service: Cardiovascular;  Laterality: N/A;  . CORONARY/GRAFT ACUTE MI REVASCULARIZATION N/A 11/29/2017   Procedure: Coronary/Graft Acute MI Revascularization;  Surgeon: Lyn Records, MD;  Location: [facility] INVASIVE CV LAB;  Service: Cardiovascular;  Laterality: N/A;  . LEFT HEART CATH AND CORONARY ANGIOGRAPHY N/A 11/29/2017   Procedure: LEFT HEART CATH AND CORONARY ANGIOGRAPHY;  Surgeon: Lyn Records, MD;  Location: [facility] INVASIVE CV LAB;  Service: Cardiovascular;  Laterality: N/A;  . LUMBAR DISC SURGERY     L5-S1    There were no vitals filed for this visit.  Subjective Assessment - 01/05/18 1432    Subjective  Reports she still has the L shoulder pain.    Pertinent History  R elbow pain    Patient Stated Goals  to get rid of shoulder pain    Currently in  Pain?  Yes    Pain Score  2     Pain Location  Shoulder    Pain Orientation  Left    Pain Descriptors / Indicators  Constant    Pain Type  Acute pain    Pain Onset  More than a month ago    Pain Frequency  Intermittent    Aggravating Factors   Horizontal adduction    Pain Relieving Factors  Rest         [facility] PT Assessment - 01/05/18 0001      Assessment   Medical Diagnosis  acute pain of left shoulder    Onset Date/Surgical Date  09/10/17    Hand Dominance  Right      Precautions   Precautions  None                  [facility] Adult PT Treatment/Exercise - 01/05/18 0001      Shoulder Exercises: Standing   Protraction  Strengthening;Theraband;20 reps;Left    Theraband Level (Shoulder Protraction)  Level 1 (Yellow)    External Rotation  Strengthening;20 reps;Theraband;Left    Theraband Level (Shoulder External Rotation)  Level 1 (Yellow)    Internal Rotation  Strengthening;20 reps;Theraband;Left    Theraband Level (Shoulder Internal Rotation)  Level 1 (Yellow)    Extension  Strengthening;Left;20 reps;Theraband    Theraband Level (Shoulder Extension)  Level  1 (Yellow)    Row  Strengthening;Left;20 reps;Theraband    Theraband Level (Shoulder Row)  Level 1 (Yellow)      Shoulder Exercises: Pulleys   Other Pulley Exercises  UE ranger flex x30 reps      Shoulder Exercises: ROM/Strengthening   UBE (Upper Arm Bike)  120 RPM x6 min ( 3 m forward, 3 m backward)      Modalities   Modalities  Electrical Stimulation;Moist Heat      Moist Heat Therapy   Number Minutes Moist Heat  15 Minutes    Moist Heat Location  Shoulder      Electrical Stimulation   Electrical Stimulation Location  LT shoulder    Electrical Stimulation Action  Pre-Mod    Electrical Stimulation Parameters  80-150 hz x15 min    Electrical Stimulation Goals  Pain      Manual Therapy   Manual Therapy  Soft tissue mobilization    Soft tissue mobilization  STW to L lateral pectoralis, bicep and  deltoid to reduce muscle tightness                  PT Long Term Goals - 01/03/18 1407      PT LONG TERM GOAL #1   Title  Independent with a HEP.    Time  6    Period  Weeks    Status  Achieved      PT LONG TERM GOAL #2   Title  Patient able to work with pain <= 1/10 in her L shoulder.    Baseline  up to 3/10    Time  6    Period  Weeks    Status  On-going      PT LONG TERM GOAL #3   Title  Patient to report no neurologic sx into LUE with ADLS.    Time  6    Period  Weeks    Status  Achieved      PT LONG TERM GOAL #4   Title  Increase L shoulder strength to 5/5 without pain.    Time  6    Period  Weeks    Status  Achieved            Plan - 01/05/18 1600    Clinical Impression Statement  Patient presented in clinic with only reports of pain with L shoulder adduction. Patient limited with resisted L shoulder extension as well as ER secondary to weakness. Patient still presenting with tightness of the L lateral pectoralis and proximal Bicep. Normal modalities response noted following removal of the modalities.    Rehab Potential  Excellent    PT Frequency  2x / week    PT Duration  4 weeks    PT Treatment/Interventions  ADLs/Self Care Home Management;Cryotherapy;Electrical Stimulation;Moist Heat;Ultrasound;Therapeutic exercise;Neuromuscular re-education;Patient/family education;Manual techniques;Dry needling;Iontophoresis 4mg /ml Dexamethasone;Taping    PT Next Visit Plan  Focus on RC strengthening, subscapularis release, STW to RUE and scapular muscles prn; review RW4 (painfree), scapular stab. DN prn to RC/deltoid/biceps. Modalities prn.    Consulted and Agree with Plan of Care  Patient       Patient will benefit from skilled therapeutic intervention in order to improve the following deficits and impairments:  Pain, Decreased strength, Increased muscle spasms, Decreased range of motion, Postural dysfunction  Visit Diagnosis: Acute pain of left  shoulder  Stiffness of left shoulder, not elsewhere classified  Muscle weakness (generalized)     Problem List Patient Active Problem List  Diagnosis Date Noted  . GERD (gastroesophageal reflux disease)   . Coronary artery disease   . Allergy   . Acute ST elevation myocardial infarction (STEMI) involving left anterior descending (LAD) coronary artery (HCC) 11/29/2017  . Acute ST elevation myocardial infarction (STEMI) of anterior wall (HCC)   . CAD in native artery   . VF (ventricular fibrillation) (HCC)   . Myocardial infarction acute (HCC) 10/27/2017  . Ischemic cardiomyopathy 10/27/2017  . Cardiac arrest with ventricular fibrillation (HCC) 10/27/2017  . Left shoulder pain 09/29/2017  . Right elbow pain 01/24/2017  . Ulcer-like dyspepsia 04/04/2012  . Tobacco use 04/04/2012    Marvell Fuller, PTA 01/05/2018, 4:07 PM  [facility] [address] [place], [place], [address] Phone: [phone]   Fax:  [phone]  Name: Sarah Fox MRN: [id number] Date of Birth: 1966-12-09

## 2018-01-09 ENCOUNTER — Encounter: Payer: Self-pay | Admitting: Physical Therapy

## 2018-01-09 ENCOUNTER — Ambulatory Visit: Payer: Federal, State, Local not specified - PPO | Admitting: Physical Therapy

## 2018-01-09 DIAGNOSIS — M25612 Stiffness of left shoulder, not elsewhere classified: Secondary | ICD-10-CM

## 2018-01-09 DIAGNOSIS — M25512 Pain in left shoulder: Secondary | ICD-10-CM

## 2018-01-09 DIAGNOSIS — M6281 Muscle weakness (generalized): Secondary | ICD-10-CM | POA: Diagnosis not present

## 2018-01-09 DIAGNOSIS — M25521 Pain in right elbow: Secondary | ICD-10-CM | POA: Diagnosis not present

## 2018-01-09 NOTE — Therapy (Signed)
[facility] [address] [place], [place], [address] Phone: [phone]   Fax:  [phone]  Physical Therapy Treatment  Patient Details  Name: Sarah Fox MRN: [id number] Date of Birth: [birth year] Referring Provider: Dr. Norton Blizzard   Encounter Date: 01/09/2018  PT End of Session - 01/09/18 1312    Visit Number  9    Number of Visits  15    Date for PT Re-Evaluation  01/31/18    PT Start Time  1307    PT Stop Time  1348    PT Time Calculation (min)  41 min    Activity Tolerance  Patient tolerated treatment well    Behavior During Therapy  [facility] for tasks assessed/performed       Past Medical History:  Diagnosis Date  . Allergy   . Cardiac arrest with ventricular fibrillation (HCC) 10/2017   with acute MI and hypokalemia  . Coronary artery disease   . GERD (gastroesophageal reflux disease)   . Ischemic cardiomyopathy 10/2017  . Myocardial infarction acute (HCC) 10/2017    Past Surgical History:  Procedure Laterality Date  . CORONARY STENT INTERVENTION N/A 11/29/2017   Procedure: CORONARY STENT INTERVENTION;  Surgeon: Lyn Records, MD;  Location: [facility] INVASIVE CV LAB;  Service: Cardiovascular;  Laterality: N/A;  . CORONARY/GRAFT ACUTE MI REVASCULARIZATION N/A 11/29/2017   Procedure: Coronary/Graft Acute MI Revascularization;  Surgeon: Lyn Records, MD;  Location: [facility] INVASIVE CV LAB;  Service: Cardiovascular;  Laterality: N/A;  . LEFT HEART CATH AND CORONARY ANGIOGRAPHY N/A 11/29/2017   Procedure: LEFT HEART CATH AND CORONARY ANGIOGRAPHY;  Surgeon: Lyn Records, MD;  Location: [facility] INVASIVE CV LAB;  Service: Cardiovascular;  Laterality: N/A;  . LUMBAR DISC SURGERY     L5-S1    There were no vitals filed for this visit.  Subjective Assessment - 01/09/18 1310    Subjective  Reports soreness from not using her shoulder but pain is about the same.    Pertinent History  R elbow pain    Patient Stated Goals  to get rid of  shoulder pain    Currently in Pain?  Yes    Pain Score  1     Pain Location  Shoulder    Pain Orientation  Left    Pain Descriptors / Indicators  Sore    Pain Type  Acute pain    Pain Onset  More than a month ago         [facility] PT Assessment - 01/09/18 0001      Assessment   Medical Diagnosis  acute pain of left shoulder    Onset Date/Surgical Date  09/10/17    Hand Dominance  Right      Precautions   Precautions  None                  [facility] Adult PT Treatment/Exercise - 01/09/18 0001      Shoulder Exercises: Sidelying   External Rotation  Strengthening;Left;20 reps;Weights    External Rotation Weight (lbs)  1      Shoulder Exercises: Standing   Protraction  Strengthening;Theraband;20 reps;Left    Theraband Level (Shoulder Protraction)  Level 1 (Yellow)    External Rotation  Strengthening;20 reps;Theraband;Left    Theraband Level (Shoulder External Rotation)  Level 1 (Yellow)    Internal Rotation  Strengthening;20 reps;Theraband;Left    Theraband Level (Shoulder Internal Rotation)  Level 1 (Yellow)    Flexion  Strengthening;Left;20 reps;Weights    Shoulder  Flexion Weight (lbs)  1    ABduction  Strengthening;Right;20 reps;Weights    Shoulder ABduction Weight (lbs)  1    Extension  Strengthening;Left;20 reps;Theraband    Theraband Level (Shoulder Extension)  Level 1 (Yellow)    Row  Strengthening;Left;20 reps;Theraband    Theraband Level (Shoulder Row)  Level 1 (Yellow)    Other Standing Exercises  L shoulder scaption 1# x20 reps      Shoulder Exercises: ROM/Strengthening   UBE (Upper Arm Bike)  90 RPM x6 min ( 3 min forward, 3 min backward)      Modalities   Modalities  Electrical Stimulation;Moist Heat      Moist Heat Therapy   Number Minutes Moist Heat  15 Minutes    Moist Heat Location  Shoulder      Electrical Stimulation   Electrical Stimulation Location  LT shoulder    Electrical Stimulation Action  Pre-Mod    Electrical Stimulation Parameters   80-150 hz x15 min    Electrical Stimulation Goals  Pain                  PT Long Term Goals - 01/03/18 1407      PT LONG TERM GOAL #1   Title  Independent with a HEP.    Time  6    Period  Weeks    Status  Achieved      PT LONG TERM GOAL #2   Title  Patient able to work with pain <= 1/10 in her L shoulder.    Baseline  up to 3/10    Time  6    Period  Weeks    Status  On-going      PT LONG TERM GOAL #3   Title  Patient to report no neurologic sx into LUE with ADLS.    Time  6    Period  Weeks    Status  Achieved      PT LONG TERM GOAL #4   Title  Increase L shoulder strength to 5/5 without pain.    Time  6    Period  Weeks    Status  Achieved            Plan - 01/09/18 1357    Clinical Impression Statement  Patient tolerated today's treatment well with only reports of L shoulder fatigue with strengthening exercises. Patient did not report any increased pain with exercises today. Patient demonstrated improved technique and form with resisted strengthening exercises especially ER. Normal modalities response noted following removal of the modalities.    Rehab Potential  Excellent    PT Frequency  2x / week    PT Duration  4 weeks    PT Treatment/Interventions  ADLs/Self Care Home Management;Cryotherapy;Electrical Stimulation;Moist Heat;Ultrasound;Therapeutic exercise;Neuromuscular re-education;Patient/family education;Manual techniques;Dry needling;Iontophoresis 4mg /ml Dexamethasone;Taping    PT Next Visit Plan  Focus on RC strengthening, subscapularis release, STW to RUE and scapular muscles prn; review RW4 (painfree), scapular stab. DN prn to RC/deltoid/biceps. Modalities prn.    Consulted and Agree with Plan of Care  Patient       Patient will benefit from skilled therapeutic intervention in order to improve the following deficits and impairments:  Pain, Decreased strength, Increased muscle spasms, Decreased range of motion, Postural dysfunction  Visit  Diagnosis: Acute pain of left shoulder  Stiffness of left shoulder, not elsewhere classified  Muscle weakness (generalized)     Problem List Patient Active Problem List   Diagnosis Date Noted  . GERD (  gastroesophageal reflux disease)   . Coronary artery disease   . Allergy   . Acute ST elevation myocardial infarction (STEMI) involving left anterior descending (LAD) coronary artery (HCC) 11/29/2017  . Acute ST elevation myocardial infarction (STEMI) of anterior wall (HCC)   . CAD in native artery   . VF (ventricular fibrillation) (HCC)   . Myocardial infarction acute (HCC) 10/27/2017  . Ischemic cardiomyopathy 10/27/2017  . Cardiac arrest with ventricular fibrillation (HCC) 10/27/2017  . Left shoulder pain 09/29/2017  . Right elbow pain 01/24/2017  . Ulcer-like dyspepsia 04/04/2012  . Tobacco use 04/04/2012    Marvell Fuller, PTA 01/09/2018, 1:59 PM  [facility] [address] [place], [place], [address] Phone: [phone]   Fax:  [phone]  Name: Shanitha Twining MRN: [id number] Date of Birth: 08-21-1966

## 2018-01-10 ENCOUNTER — Encounter: Payer: Self-pay | Admitting: Cardiology

## 2018-01-10 ENCOUNTER — Ambulatory Visit: Payer: Federal, State, Local not specified - PPO | Admitting: Cardiology

## 2018-01-10 ENCOUNTER — Encounter: Payer: Self-pay | Admitting: *Deleted

## 2018-01-10 VITALS — BP 100/62 | HR 65 | Ht 65.0 in | Wt 125.8 lb

## 2018-01-10 DIAGNOSIS — I2510 Atherosclerotic heart disease of native coronary artery without angina pectoris: Secondary | ICD-10-CM | POA: Diagnosis not present

## 2018-01-10 DIAGNOSIS — I255 Ischemic cardiomyopathy: Secondary | ICD-10-CM | POA: Diagnosis not present

## 2018-01-10 DIAGNOSIS — I959 Hypotension, unspecified: Secondary | ICD-10-CM

## 2018-01-10 DIAGNOSIS — E782 Mixed hyperlipidemia: Secondary | ICD-10-CM | POA: Diagnosis not present

## 2018-01-10 DIAGNOSIS — I4901 Ventricular fibrillation: Secondary | ICD-10-CM | POA: Diagnosis not present

## 2018-01-10 DIAGNOSIS — I2109 ST elevation (STEMI) myocardial infarction involving other coronary artery of anterior wall: Secondary | ICD-10-CM | POA: Diagnosis not present

## 2018-01-10 NOTE — Progress Notes (Signed)
Cardiology Office Note    Date:  01/10/2018   ID:  Sarah Fox, Sarah Fox 18-Oct-1966, MRN [id number]  PCP:  Junie Spencer, FNP  Cardiologist:  Dr. Katrinka Blazing     Chief Complaint  Patient presents with  . Coronary Artery Disease  . Fatigue      History of Present Illness: Sarah Fox is a 52 y.o. female who presents for CAD and follow up with lower end of BP.    STEMI in LAD. 11/29/17 to 12/02/17.  She did have VF.   She has a hx of gastroesophageal reflux,  + tobacco use, FH of CAD, rotator cuff injury and was going to PT,  who developed  morning after a shower, developed crushing chest discomfort and eventually called EMS. An EKG was transmitted that demonstrated anterior ST elevation. The patient had no prior history of cardiac disease. The discomfort is characterized as severe precordial and extending into the interscapular region.  She went emergently to the cath lab.  In the lab she had Ventricular fibrillation requiring electrical conversion x3 in succession prior to PCI. CPR was not necessary. Hypokalemia of 3.0 was identified   Successful LAD PCI following aspiration thrombectomy reducing 99% stenosis with TIMI grade II antegrade flow to 0% with TIMI grade III flow. The 3.5 x 22 mm Onyx was postdilated to 3.75 mm at high pressure.  Acute anterior myocardial infarction due to occlusion of the proximal LAD with 99% thrombus contained obstruction with TIMI grade II flow on initial angiography   EF was 25-30%  With G2 DD.  On low dosebeta blocker. Potassium repleted. Given LV dysfunction, providedLifevest at discharge.   At discharge onACEi and Coreg. Titration limited by low BP.May add aldactone later if BP tolerates.  We rechecked her echo and EF to 50-55% and life vest was stopped.    Today she is feeling well.  One brief 1 second of chest pain.  She is mostly tired with fatigue.  No syncope and no SOB.  Her BP runs low.  We decreased her lisinopril on last visit  to 1.25 daily but BP still low.   We discussed diet. Answered questions. she has not smoked at all.    Past Medical History:  Diagnosis Date  . Allergy   . Cardiac arrest with ventricular fibrillation (HCC) 10/2017   with acute MI and hypokalemia  . Coronary artery disease   . GERD (gastroesophageal reflux disease)   . Ischemic cardiomyopathy 10/2017  . Myocardial infarction acute (HCC) 10/2017    Past Surgical History:  Procedure Laterality Date  . CORONARY STENT INTERVENTION N/A 11/29/2017   Procedure: CORONARY STENT INTERVENTION;  Surgeon: Lyn Records, MD;  Location: [facility] INVASIVE CV LAB;  Service: Cardiovascular;  Laterality: N/A;  . CORONARY/GRAFT ACUTE MI REVASCULARIZATION N/A 11/29/2017   Procedure: Coronary/Graft Acute MI Revascularization;  Surgeon: Lyn Records, MD;  Location: [facility] INVASIVE CV LAB;  Service: Cardiovascular;  Laterality: N/A;  . LEFT HEART CATH AND CORONARY ANGIOGRAPHY N/A 11/29/2017   Procedure: LEFT HEART CATH AND CORONARY ANGIOGRAPHY;  Surgeon: Lyn Records, MD;  Location: [facility] INVASIVE CV LAB;  Service: Cardiovascular;  Laterality: N/A;  . LUMBAR DISC SURGERY     L5-S1     Current Outpatient Medications  Medication Sig Dispense Refill  . aspirin EC 81 MG tablet Take 1 tablet (81 mg total) by mouth daily.    Marland Kitchen atorvastatin (LIPITOR) 80 MG tablet Take 1 tablet (80 mg total) by mouth daily  at 6 PM. 90 tablet 3  . carvedilol (COREG) 3.125 MG tablet Take 1 tablet (3.125 mg total) by mouth 2 (two) times daily with a meal. 60 tablet 6  . escitalopram (LEXAPRO) 10 MG tablet Take 1 tablet (10 mg total) by mouth daily. 90 tablet 3  . lisinopril (PRINIVIL,ZESTRIL) 2.5 MG tablet Take 0.5 tablets (1.25 mg total) by mouth daily. 30 tablet 6  . nitroGLYCERIN (NITROSTAT) 0.4 MG SL tablet Place 1 tablet (0.4 mg total) under the tongue every 5 (five) minutes as needed for chest pain. 25 tablet 12  . omeprazole (PRILOSEC) 40 MG capsule TAKE 1 CAPSULE BY MOUTH EVERY DAY  90 capsule 0  . ticagrelor (BRILINTA) 90 MG TABS tablet Take 1 tablet (90 mg total) by mouth 2 (two) times daily. 60 tablet 11   No current facility-administered medications for this visit.     Allergies:   Patient has no known allergies.    Social History:  The patient  reports that she quit smoking about 6 weeks ago. She has a 10.00 pack-year smoking history. she has never used smokeless tobacco. She reports that she drinks alcohol. She reports that she does not use drugs.   Family History:  The patient's family history includes Breast cancer in her mother and sister; Heart disease in her brother.    ROS:  General:no colds or fevers, no weight changes Skin:no rashes or ulcers HEENT:no blurred vision, no congestion CV:see HPI PUL:see HPI GI:no diarrhea constipation or melena, no indigestion GU:no hematuria, no dysuria MS:no joint pain, no claudication Neuro:no syncope, no lightheadedness Endo:no diabetes, no thyroid disease  Wt Readings from Last 3 Encounters:  01/10/18 125 lb 12.8 oz (57.1 kg)  12/13/17 120 lb 6.4 oz (54.6 kg)  12/08/17 125 lb (56.7 kg)     PHYSICAL EXAM: VS:  BP 100/62   Pulse 65   Ht 5\' 5"  (1.651 m)   Wt 125 lb 12.8 oz (57.1 kg)   SpO2 98%   BMI 20.93 kg/m  , BMI Body mass index is 20.93 kg/m. General:Pleasant affect, NAD Skin:Warm and dry, brisk capillary refill HEENT:normocephalic, sclera clear, mucus membranes moist Neck:supple, no JVD, no bruits  Heart:S1S2 RRR without murmur, gallup, rub or click Lungs:clear without rales, rhonchi, or wheezes VHQ:IONG, non tender, + BS, do not palpate liver spleen or masses Ext:no lower ext edema, 2+ pedal pulses, 2+ radial pulses Neuro:alert and oriented, MAE, follows commands, + facial symmetry    EKG:  EKG is ordered today. The ekg ordered today demonstrates SR septal infarct no acute changes.    Recent Labs: 11/30/2017: Magnesium 1.8 12/08/2017: ALT 42; BUN 9; Creatinine, Ser 0.59; Hemoglobin  13.3; Platelets 362; Potassium 4.5; Sodium 141    Lipid Panel    Component Value Date/Time   CHOL 133 11/30/2017 0012   TRIG 61 11/30/2017 0012   HDL 58 11/30/2017 0012   CHOLHDL 2.3 11/30/2017 0012   VLDL 12 11/30/2017 0012   LDLCALC 63 11/30/2017 0012       Other studies Reviewed: Additional studies/ records that were reviewed today include: . Emergent Cardiac cath for STEMI Conclusion    Acute anterior myocardial infarction due to occlusion of the proximal LAD with 99% thrombus contained obstruction with TIMI grade II flow on initial angiography.  Ventricular fibrillation requiring electrical conversion x3 in succession prior to PCI. CPR was not necessary. Hypokalemia of 3.0 was identified.  Successful LAD PCI following aspiration thrombectomy reducing 99% stenosis with TIMI grade II antegrade flow  to 0% with TIMI grade III flow. The 3.5 x 22 mm Onyx was postdilated to 3.75 mm at high pressure.  Normal right coronary artery.  Normal left main coronary artery.  Normal circumflex coronary artery.  Acute diastolic heart failure documented by moderate elevation in LVEDP.  RECOMMENDATIONS:   Aggrastat times 18 hours  Aspirin and Brilinta times 12 months  Risk factor modification as needed: Hemoglobin A1c, lipid panel, have been ordered.  Beta-blocker and ACE inhibitor therapy should be started once hemodynamics were stable.  Discharge at 48 hours if remains stable.   Ischemic cardiomyopathy ECHO Study Conclusions  - Left ventricle: The cavity size was normal. Wall thickness was normal. Systolic function was severely reduced. The estimated ejection fraction was in the range of 25% to 30%. Features are consistent with a pseudonormal left ventricular filling pattern, with concomitant abnormal relaxation and increased filling pressure (grade 2 diastolic dysfunction). Doppler parameters are consistent with high ventricular filling pressure. -  Regional wall motion abnormality: Akinesis of the apical anterior, apical inferior, apical septal, apical lateral, and apical myocardium; severe hypokinesis of the mid anterior, mid anteroseptal, basal-mid inferoseptal, and mid anterolateral myocardium. - Mitral valve: There was mild regurgitation. - Left atrium: The atrium was moderately dilated. - Atrial septum: The septum bowed from left to right, consistent with increased left atrial pressure. No defect or patent foramen ovale was identified. - Tricuspid valve: There was mild regurgitation.    ASSESSMENT AND PLAN:  1.  Recent STEMI of Ant wall, with LAD stent placed, on ASA and Brilinta doing well, no planning going to rehab, has started to exercise by walking.  She may return to work next week letter written, ok to resume sexual activity.    2.  Borderline BP discussed with Dr. Katrinka Blazing ok to stop lisinopril and add a little more salt. Continue to weigh daily and call if becomes swollen or wt gain  3.  Cardiomyopathy with MI, ischemic now improved. Life vest stopped. Euvolemic,  Will recheck Echo in 2-3 months to further eval and follow up with Dr. Katrinka Blazing.  (v fib arrest with MI)  4.  HLD on statin, will check lipids and hepatic today     Current medicines are reviewed with the patient today.  The patient Has no concerns regarding medicines.  The following changes have been made:  See above Labs/ tests ordered today include:see above  Disposition:   FU:  see above  Signed, Nada Boozer, NP  01/10/2018 3:18 PM    [facility] [address] [place], [place], [place]  [address]/ [address] [place], [place] Phone: [phone]; Fax: [phone]  [phone]

## 2018-01-10 NOTE — Patient Instructions (Addendum)
Medication Instructions:  Your physician has recommended you make the following change in your medication:  1.  STOP the Lisinopril  Labwork: TODAY:  LIPID & LFT  Testing/Procedures: Your physician has requested that you have an echocardiogram IN 2-3 MONTHS. Echocardiography is a painless test that uses sound waves to create images of your heart. It provides your doctor with information about the size and shape of your heart and how well your heart's chambers and valves are working. This procedure takes approximately one hour. There are no restrictions for this procedure.    Follow-Up: Your physician recommends that you schedule a follow-up appointment in: AFTER ECHO WITH DR. Katrinka Blazing ONLY, PER DR. Katrinka Blazing   Any Other Special Instructions Will Be Listed Below (If Applicable). You may increase your salt intake a little bit   Echocardiogram An echocardiogram, or echocardiography, uses sound waves (ultrasound) to produce an image of your heart. The echocardiogram is simple, painless, obtained within a short period of time, and offers valuable information to your health care provider. The images from an echocardiogram can provide information such as:  Evidence of coronary artery disease (CAD).  Heart size.  Heart muscle function.  Heart valve function.  Aneurysm detection.  Evidence of a past heart attack.  Fluid buildup around the heart.  Heart muscle thickening.  Assess heart valve function.  Tell a health care provider about:  Any allergies you have.  All medicines you are taking, including vitamins, herbs, eye drops, creams, and over-the-counter medicines.  Any problems you or family members have had with anesthetic medicines.  Any blood disorders you have.  Any surgeries you have had.  Any medical conditions you have.  Whether you are pregnant or may be pregnant. What happens before the procedure? No special preparation is needed. Eat and drink normally. What  happens during the procedure?  In order to produce an image of your heart, gel will be applied to your chest and a wand-like tool (transducer) will be moved over your chest. The gel will help transmit the sound waves from the transducer. The sound waves will harmlessly bounce off your heart to allow the heart images to be captured in real-time motion. These images will then be recorded.  You may need an IV to receive a medicine that improves the quality of the pictures. What happens after the procedure? You may return to your normal schedule including diet, activities, and medicines, unless your health care provider tells you otherwise. This information is not intended to replace advice given to you by your health care provider. Make sure you discuss any questions you have with your health care provider. Document Released: 12/10/2000 Document Revised: 07/31/2016 Document Reviewed: 08/20/2013 Elsevier Interactive Patient Education  2017 [employer].    If you need a refill on your cardiac medications before your next appointment, please call your pharmacy.

## 2018-01-11 ENCOUNTER — Encounter (HOSPITAL_COMMUNITY): Payer: Federal, State, Local not specified - PPO

## 2018-01-11 LAB — LIPID PANEL
CHOL/HDL RATIO: 1.8 ratio (ref 0.0–4.4)
CHOLESTEROL TOTAL: 110 mg/dL (ref 100–199)
HDL: 61 mg/dL (ref >39)
LDL CALC: 21 mg/dL (ref 0–99)
Triglycerides: 140 mg/dL (ref 0–149)
VLDL Cholesterol Cal: 28 mg/dL (ref 5–40)

## 2018-01-11 LAB — HEPATIC FUNCTION PANEL
ALBUMIN: 4.6 g/dL (ref 3.5–5.5)
ALT: 21 IU/L (ref 0–32)
AST: 16 IU/L (ref 0–40)
Alkaline Phosphatase: 61 IU/L (ref 39–117)
BILIRUBIN TOTAL: 0.4 mg/dL (ref 0.0–1.2)
BILIRUBIN, DIRECT: 0.11 mg/dL (ref 0.00–0.40)
Total Protein: 6.9 g/dL (ref 6.0–8.5)

## 2018-01-12 ENCOUNTER — Telehealth: Payer: Self-pay | Admitting: Cardiology

## 2018-01-12 ENCOUNTER — Encounter: Payer: Self-pay | Admitting: Physical Therapy

## 2018-01-12 ENCOUNTER — Ambulatory Visit: Payer: Federal, State, Local not specified - PPO | Admitting: Physical Therapy

## 2018-01-12 DIAGNOSIS — M25512 Pain in left shoulder: Secondary | ICD-10-CM

## 2018-01-12 DIAGNOSIS — M25612 Stiffness of left shoulder, not elsewhere classified: Secondary | ICD-10-CM | POA: Diagnosis not present

## 2018-01-12 DIAGNOSIS — M25521 Pain in right elbow: Secondary | ICD-10-CM | POA: Diagnosis not present

## 2018-01-12 DIAGNOSIS — M6281 Muscle weakness (generalized): Secondary | ICD-10-CM | POA: Diagnosis not present

## 2018-01-12 NOTE — Therapy (Signed)
[facility] [address] [place], [place], [address] Phone: [phone]   Fax:  [phone]  Physical Therapy Treatment  Patient Details  Name: Sarah Fox MRN: [id number] Date of Birth: 17-Nov-1966 Referring Provider: Dr. Norton Blizzard   Encounter Date: 01/12/2018  PT End of Session - 01/12/18 1344    Visit Number  10    Number of Visits  15    Date for PT Re-Evaluation  01/31/18    PT Start Time  0103    PT Stop Time  0153    PT Time Calculation (min)  50 min    Activity Tolerance  Patient tolerated treatment well    Behavior During Therapy  [facility] for tasks assessed/performed       Past Medical History:  Diagnosis Date  . Allergy   . Cardiac arrest with ventricular fibrillation (HCC) 10/2017   with acute MI and hypokalemia  . Coronary artery disease   . GERD (gastroesophageal reflux disease)   . Ischemic cardiomyopathy 10/2017  . Myocardial infarction acute (HCC) 10/2017    Past Surgical History:  Procedure Laterality Date  . CORONARY STENT INTERVENTION N/A 11/29/2017   Procedure: CORONARY STENT INTERVENTION;  Surgeon: Lyn Records, MD;  Location: [facility] INVASIVE CV LAB;  Service: Cardiovascular;  Laterality: N/A;  . CORONARY/GRAFT ACUTE MI REVASCULARIZATION N/A 11/29/2017   Procedure: Coronary/Graft Acute MI Revascularization;  Surgeon: Lyn Records, MD;  Location: [facility] INVASIVE CV LAB;  Service: Cardiovascular;  Laterality: N/A;  . LEFT HEART CATH AND CORONARY ANGIOGRAPHY N/A 11/29/2017   Procedure: LEFT HEART CATH AND CORONARY ANGIOGRAPHY;  Surgeon: Lyn Records, MD;  Location: [facility] INVASIVE CV LAB;  Service: Cardiovascular;  Laterality: N/A;  . LUMBAR DISC SURGERY     L5-S1    There were no vitals filed for this visit.  Subjective Assessment - 01/12/18 1332    Subjective  My shoulder is doing much better.  The doctor is letting me go back to work next week.    Patient Stated Goals  to get rid of shoulder pain    Pain  Score  1     Pain Location  Shoulder    Pain Orientation  Left    Pain Descriptors / Indicators  Sore    Pain Onset  More than a month ago                      [facility] Adult PT Treatment/Exercise - 01/12/18 0001      Exercises   Exercises  Shoulder      Shoulder Exercises: Standing   Other Standing Exercises  Standing UE Ranger into flexion ad circles (CW and CCW) x 5 minutes.    Other Standing Exercises  RW4 with yellow to fatigue x 2 and yellow band resisted left shoulder ER with shoulder held at 45 degrees of abduction to fatigue x 1.      Shoulder Exercises: ROM/Strengthening   UBE (Upper Arm Bike)  90 RPM's 10 minutes (5 minutes forward and 5 minutes backward).      Modalities   Modalities  Electrical Stimulation;Moist Heat      Moist Heat Therapy   Number Minutes Moist Heat  20 Minutes    Moist Heat Location  -- Left shoulder.                  PT Long Term Goals - 01/03/18 1407      PT LONG TERM GOAL #1  Title  Independent with a HEP.    Time  6    Period  Weeks    Status  Achieved      PT LONG TERM GOAL #2   Title  Patient able to work with pain <= 1/10 in her L shoulder.    Baseline  up to 3/10    Time  6    Period  Weeks    Status  On-going      PT LONG TERM GOAL #3   Title  Patient to report no neurologic sx into LUE with ADLS.    Time  6    Period  Weeks    Status  Achieved      PT LONG TERM GOAL #4   Title  Increase L shoulder strength to 5/5 without pain.    Time  6    Period  Weeks    Status  Achieved              Patient will benefit from skilled therapeutic intervention in order to improve the following deficits and impairments:     Visit Diagnosis: Acute pain of left shoulder     Problem List Patient Active Problem List   Diagnosis Date Noted  . GERD (gastroesophageal reflux disease)   . Coronary artery disease   . Allergy   . Acute ST elevation myocardial infarction (STEMI) involving left anterior  descending (LAD) coronary artery (HCC) 11/29/2017  . Acute ST elevation myocardial infarction (STEMI) of anterior wall (HCC)   . CAD in native artery   . VF (ventricular fibrillation) (HCC)   . Myocardial infarction acute (HCC) 10/27/2017  . Ischemic cardiomyopathy 10/27/2017  . Cardiac arrest with ventricular fibrillation (HCC) 10/27/2017  . Left shoulder pain 09/29/2017  . Right elbow pain 01/24/2017  . Ulcer-like dyspepsia 04/04/2012  . Tobacco use 04/04/2012    Mardelle Pandolfi, [place] MPT 01/12/2018, 2:10 PM  [facility] [address] [place], [place], [address] Phone: [phone]   Fax:  [phone]  Name: Sarah Fox MRN: [id number] Date of Birth: 1966/12/22

## 2018-01-12 NOTE — Telephone Encounter (Signed)
-----   Message from Leone Brand, NP sent at 01/12/2018  1:15 PM EST ----- Cholesterol is excellent.  No changes.

## 2018-01-12 NOTE — Telephone Encounter (Signed)
Returned pts call and she has been made aware of her lab results. See result note. 

## 2018-01-12 NOTE — Telephone Encounter (Signed)
New Message  ° ° ° °Patient returning call for lab results  °

## 2018-01-16 ENCOUNTER — Other Ambulatory Visit (HOSPITAL_COMMUNITY): Payer: Self-pay

## 2018-01-16 ENCOUNTER — Ambulatory Visit: Payer: Federal, State, Local not specified - PPO | Admitting: Physical Therapy

## 2018-01-16 DIAGNOSIS — M25612 Stiffness of left shoulder, not elsewhere classified: Secondary | ICD-10-CM | POA: Diagnosis not present

## 2018-01-16 DIAGNOSIS — M25512 Pain in left shoulder: Secondary | ICD-10-CM

## 2018-01-16 DIAGNOSIS — M25521 Pain in right elbow: Secondary | ICD-10-CM | POA: Diagnosis not present

## 2018-01-16 DIAGNOSIS — M6281 Muscle weakness (generalized): Secondary | ICD-10-CM | POA: Diagnosis not present

## 2018-01-16 NOTE — Therapy (Signed)
[facility] [address] [place], [place], [address] Phone: [phone]   Fax:  [phone]  Physical Therapy Treatment  Patient Details  Name: Sarah Fox MRN: [id number] Date of Birth: 12-Feb-1966 Referring Provider: Dr. Norton Blizzard   Encounter Date: 01/16/2018  PT End of Session - 01/16/18 1348    Visit Number  11    Number of Visits  15    Date for PT Re-Evaluation  01/31/18    PT Start Time  1349    PT Stop Time  1440    PT Time Calculation (min)  51 min    Activity Tolerance  Patient tolerated treatment well    Behavior During Therapy  [facility] for tasks assessed/performed       Past Medical History:  Diagnosis Date  . Allergy   . Cardiac arrest with ventricular fibrillation (HCC) 10/2017   with acute MI and hypokalemia  . Coronary artery disease   . GERD (gastroesophageal reflux disease)   . Ischemic cardiomyopathy 10/2017  . Myocardial infarction acute (HCC) 10/2017    Past Surgical History:  Procedure Laterality Date  . CORONARY STENT INTERVENTION N/A 11/29/2017   Procedure: CORONARY STENT INTERVENTION;  Surgeon: Lyn Records, MD;  Location: [facility] INVASIVE CV LAB;  Service: Cardiovascular;  Laterality: N/A;  . CORONARY/GRAFT ACUTE MI REVASCULARIZATION N/A 11/29/2017   Procedure: Coronary/Graft Acute MI Revascularization;  Surgeon: Lyn Records, MD;  Location: [facility] INVASIVE CV LAB;  Service: Cardiovascular;  Laterality: N/A;  . LEFT HEART CATH AND CORONARY ANGIOGRAPHY N/A 11/29/2017   Procedure: LEFT HEART CATH AND CORONARY ANGIOGRAPHY;  Surgeon: Lyn Records, MD;  Location: [facility] INVASIVE CV LAB;  Service: Cardiovascular;  Laterality: N/A;  . LUMBAR DISC SURGERY     L5-S1    There were no vitals filed for this visit.  Subjective Assessment - 01/16/18 1349    Subjective  Going back to work Wednesday. shoulder still hurts with IR.    Patient Stated Goals  to get rid of shoulder pain    Currently in Pain?  No/denies                       [facility] Adult PT Treatment/Exercise - 01/16/18 0001      Shoulder Exercises: Seated   Horizontal ABduction  Strengthening;20 reps;Theraband    Theraband Level (Shoulder Horizontal ABduction)  Level 2 (Red)    Other Seated Exercises  robber 2# x 20      Shoulder Exercises: Prone   Extension  Strengthening;Left;20 reps;Weights    Extension Weight (lbs)  3    Horizontal ABduction 1  Strengthening;Left;20 reps;Weights one set with wt; palm down x 2 sets, no wt thumb up 2 sets    Horizontal ABduction 1 Weight (lbs)  1    Horizontal ABduction 2  Strengthening;Left;20 reps;Weights one set with wt; palm down x 2 sets, no wt thumb up 2 sets    Horizontal ABduction 2 Weight (lbs)  1    Other Prone Exercises  row with 3# x 20      Shoulder Exercises: Sidelying   External Rotation  Strengthening;Left;20 reps;Weights    External Rotation Weight (lbs)  2    Other Sidelying Exercises  empty can with 1# x 10, 1x5      Shoulder Exercises: Standing   Other Standing Exercises  upright row R 3#, L 2#  1x 10, 1 set sitting      Modalities   Modalities  Electrical Stimulation      Moist Heat Therapy   Number Minutes Moist Heat  15 Minutes    Moist Heat Location  Shoulder      Electrical Stimulation   Electrical Stimulation Location  L shoulder    Electrical Stimulation Action  premod    Electrical Stimulation Parameters  80-150 Hz x 15 min    Electrical Stimulation Goals  Pain                  PT Long Term Goals - 01/03/18 1407      PT LONG TERM GOAL #1   Title  Independent with a HEP.    Time  6    Period  Weeks    Status  Achieved      PT LONG TERM GOAL #2   Title  Patient able to work with pain <= 1/10 in her L shoulder.    Baseline  up to 3/10    Time  6    Period  Weeks    Status  On-going      PT LONG TERM GOAL #3   Title  Patient to report no neurologic sx into LUE with ADLS.    Time  6    Period  Weeks    Status  Achieved       PT LONG TERM GOAL #4   Title  Increase L shoulder strength to 5/5 without pain.    Time  6    Period  Weeks    Status  Achieved            Plan - 01/16/18 1427    Clinical Impression Statement  Patient did well with new TE today but demo's significant weakness with post shoulder strengthening. She does not have smooth quality of movement with her exercises. She continues to have pain with end range horizontal ADD and was advised to avoid that motion.    PT Treatment/Interventions  ADLs/Self Care Home Management;Cryotherapy;Electrical Stimulation;Moist Heat;Ultrasound;Therapeutic exercise;Neuromuscular re-education;Patient/family education;Manual techniques;Dry needling;Iontophoresis 4mg /ml Dexamethasone;Taping    PT Next Visit Plan  Continue with prone shoulder strengthening and other scaupular strengthening. Assess RTW.       Patient will benefit from skilled therapeutic intervention in order to improve the following deficits and impairments:  Pain, Decreased strength, Increased muscle spasms, Decreased range of motion, Postural dysfunction  Visit Diagnosis: Muscle weakness (generalized)  Acute pain of left shoulder     Problem List Patient Active Problem List   Diagnosis Date Noted  . GERD (gastroesophageal reflux disease)   . Coronary artery disease   . Allergy   . Acute ST elevation myocardial infarction (STEMI) involving left anterior descending (LAD) coronary artery (HCC) 11/29/2017  . Acute ST elevation myocardial infarction (STEMI) of anterior wall (HCC)   . CAD in native artery   . VF (ventricular fibrillation) (HCC)   . Myocardial infarction acute (HCC) 10/27/2017  . Ischemic cardiomyopathy 10/27/2017  . Cardiac arrest with ventricular fibrillation (HCC) 10/27/2017  . Left shoulder pain 09/29/2017  . Right elbow pain 01/24/2017  . Ulcer-like dyspepsia 04/04/2012  . Tobacco use 04/04/2012    Solon Palm PT 01/16/2018, 3:32 PM  [facility] [address] [place], [place], [address] Phone: [phone]   Fax:  [phone]  Name: Sarah Fox MRN: [id number] Date of Birth: [birth year]

## 2018-01-19 ENCOUNTER — Ambulatory Visit: Payer: Federal, State, Local not specified - PPO | Admitting: Physical Therapy

## 2018-01-19 DIAGNOSIS — M6281 Muscle weakness (generalized): Secondary | ICD-10-CM

## 2018-01-19 DIAGNOSIS — M25612 Stiffness of left shoulder, not elsewhere classified: Secondary | ICD-10-CM | POA: Diagnosis not present

## 2018-01-19 DIAGNOSIS — M25512 Pain in left shoulder: Secondary | ICD-10-CM

## 2018-01-19 DIAGNOSIS — M25521 Pain in right elbow: Secondary | ICD-10-CM | POA: Diagnosis not present

## 2018-01-19 NOTE — Therapy (Signed)
[facility] [address] [place], [place], [address] Phone: [phone]   Fax:  [phone]  Physical Therapy Treatment  Patient Details  Name: Sarah Fox MRN: [id number] Date of Birth: 21-Mar-1966 Referring Provider: Dr. Norton Blizzard   Encounter Date: 01/19/2018  PT End of Session - 01/19/18 1601    Visit Number  12    Number of Visits  15    Date for PT Re-Evaluation  01/31/18    PT Start Time  1600    PT Stop Time  1654    PT Time Calculation (min)  54 min    Activity Tolerance  Patient tolerated treatment well    Behavior During Therapy  [facility] for tasks assessed/performed       Past Medical History:  Diagnosis Date  . Allergy   . Cardiac arrest with ventricular fibrillation (HCC) 10/2017   with acute MI and hypokalemia  . Coronary artery disease   . GERD (gastroesophageal reflux disease)   . Ischemic cardiomyopathy 10/2017  . Myocardial infarction acute (HCC) 10/2017    Past Surgical History:  Procedure Laterality Date  . CORONARY STENT INTERVENTION N/A 11/29/2017   Procedure: CORONARY STENT INTERVENTION;  Surgeon: Lyn Records, MD;  Location: [facility] INVASIVE CV LAB;  Service: Cardiovascular;  Laterality: N/A;  . CORONARY/GRAFT ACUTE MI REVASCULARIZATION N/A 11/29/2017   Procedure: Coronary/Graft Acute MI Revascularization;  Surgeon: Lyn Records, MD;  Location: [facility] INVASIVE CV LAB;  Service: Cardiovascular;  Laterality: N/A;  . LEFT HEART CATH AND CORONARY ANGIOGRAPHY N/A 11/29/2017   Procedure: LEFT HEART CATH AND CORONARY ANGIOGRAPHY;  Surgeon: Lyn Records, MD;  Location: [facility] INVASIVE CV LAB;  Service: Cardiovascular;  Laterality: N/A;  . LUMBAR DISC SURGERY     L5-S1    There were no vitals filed for this visit.  Subjective Assessment - 01/19/18 1602    Subjective  Patient reported going back to work yesterday, "it was okay." Patient reports difficulty with IR and adduction. Patient reports 1/10 pain today.    Patient Stated Goals  to get rid of shoulder pain    Currently in Pain?  Yes    Pain Score  1     Pain Location  Shoulder    Pain Orientation  Left    Pain Descriptors / Indicators  Sore    Pain Type  Acute pain    Pain Onset  More than a month ago                      [facility] Adult PT Treatment/Exercise - 01/19/18 0001      Exercises   Exercises  Shoulder      Shoulder Exercises: Standing   External Rotation  Strengthening;Left;15 reps    Theraband Level (Shoulder External Rotation)  Level 2 (Red)    Internal Rotation  Strengthening;Left;15 reps    Theraband Level (Shoulder Internal Rotation)  Level 2 (Red)    Flexion  Strengthening;Left;15 reps    Theraband Level (Shoulder Flexion)  Level 2 (Red)    Extension  Strengthening;Left;15 reps    Theraband Level (Shoulder Extension)  Level 2 (Red)    Other Standing Exercises  Standing scapular punches with Red theraband, with shoulder flexed to 90 x15    Other Standing Exercises  Body blade ER/IR & flexion/ext in shoulder at 90 3x30" each      Shoulder Exercises: ROM/Strengthening   UBE (Upper Arm Bike)  90 RPM 10 minutes 5 fowrard/5 backward  Shoulder Exercises: Body Blade   Flexion  30 seconds;3 reps    External Rotation  30 seconds;3 reps      Modalities   Modalities  Electrical Stimulation      Moist Heat Therapy   Number Minutes Moist Heat  15 Minutes    Moist Heat Location  Shoulder      Electrical Stimulation   Electrical Stimulation Location  L  shoulder    Electrical Stimulation Action  premod    Electrical Stimulation Parameters  80-150 Hz x15 min    Electrical Stimulation Goals  Pain      Manual Therapy   Manual Therapy  Joint mobilization Grade II posterior and inferior shoulder mobilization     Manual therapy comments  L shoulder rhythmic stabilization at 45 degrees and at 90 4x30" each                  PT Long Term Goals - 01/03/18 1407      PT LONG TERM GOAL #1   Title   Independent with a HEP.    Time  6    Period  Weeks    Status  Achieved      PT LONG TERM GOAL #2   Title  Patient able to work with pain <= 1/10 in her L shoulder.    Baseline  up to 3/10    Time  6    Period  Weeks    Status  On-going      PT LONG TERM GOAL #3   Title  Patient to report no neurologic sx into LUE with ADLS.    Time  6    Period  Weeks    Status  Achieved      PT LONG TERM GOAL #4   Title  Increase L shoulder strength to 5/5 without pain.    Time  6    Period  Weeks    Status  Achieved            Plan - 01/19/18 1707    Clinical Impression Statement  Patient tolerated exercises well despite starting PT with a "discomfort/soreness" in the deltoid insertion region. Patient required tactile and verbal cuing to relax shoulders to prevent shoulder elevation during exercises. Patient was able to complete 60% of time without cuing. Patient can comfortably horizontally ADD to midline but experiences "pulling" sensation beyond midline. Normal response to modalities upon removal.     Rehab Potential  Excellent    PT Frequency  2x / week    PT Duration  4 weeks    PT Treatment/Interventions  ADLs/Self Care Home Management;Cryotherapy;Electrical Stimulation;Moist Heat;Ultrasound;Therapeutic exercise;Neuromuscular re-education;Patient/family education;Manual techniques;Dry needling;Iontophoresis 4mg /ml Dexamethasone;Taping    PT Next Visit Plan  Prone shoulder strengthening to improve posterior shoulder muscles and scapular strengthening.    Consulted and Agree with Plan of Care  Patient       Patient will benefit from skilled therapeutic intervention in order to improve the following deficits and impairments:  Pain, Decreased strength, Increased muscle spasms, Decreased range of motion, Postural dysfunction  Visit Diagnosis: Acute pain of left shoulder  Muscle weakness (generalized)     Problem List Patient Active Problem List   Diagnosis Date Noted  . GERD  (gastroesophageal reflux disease)   . Coronary artery disease   . Allergy   . Acute ST elevation myocardial infarction (STEMI) involving left anterior descending (LAD) coronary artery (HCC) 11/29/2017  . Acute ST elevation myocardial infarction (STEMI) of anterior wall (  HCC)   . CAD in native artery   . VF (ventricular fibrillation) (HCC)   . Myocardial infarction acute (HCC) 10/27/2017  . Ischemic cardiomyopathy 10/27/2017  . Cardiac arrest with ventricular fibrillation (HCC) 10/27/2017  . Left shoulder pain 09/29/2017  . Right elbow pain 01/24/2017  . Ulcer-like dyspepsia 04/04/2012  . Tobacco use 04/04/2012   Guss Bunde, PT, DPT 01/19/2018, 5:24 PM  [facility] [address] [place], [place], [address] Phone: [phone]   Fax:  [phone]  Name: Ellamae Lybeck MRN: [id number] Date of Birth: Feb 26, 1966

## 2018-01-23 ENCOUNTER — Ambulatory Visit: Payer: Federal, State, Local not specified - PPO | Admitting: Physical Therapy

## 2018-01-23 DIAGNOSIS — M25512 Pain in left shoulder: Secondary | ICD-10-CM

## 2018-01-23 DIAGNOSIS — M6281 Muscle weakness (generalized): Secondary | ICD-10-CM

## 2018-01-23 DIAGNOSIS — M25612 Stiffness of left shoulder, not elsewhere classified: Secondary | ICD-10-CM

## 2018-01-23 DIAGNOSIS — M25521 Pain in right elbow: Secondary | ICD-10-CM | POA: Diagnosis not present

## 2018-01-23 NOTE — Therapy (Signed)
[facility] [address] [place], [place], [address] Phone: [phone]   Fax:  [phone]  Physical Therapy Treatment  Patient Details  Name: Sarah Fox MRN: [id number] Date of Birth: [birth year] Referring Provider: Dr. Norton Blizzard   Encounter Date: 01/23/2018  PT End of Session - 01/23/18 1343    Visit Number  13    Number of Visits  15    Date for PT Re-Evaluation  01/31/18    PT Start Time  1344    PT Stop Time  1432    PT Time Calculation (min)  48 min    Activity Tolerance  Patient tolerated treatment well    Behavior During Therapy  [facility] for tasks assessed/performed       Past Medical History:  Diagnosis Date  . Allergy   . Cardiac arrest with ventricular fibrillation (HCC) 10/2017   with acute MI and hypokalemia  . Coronary artery disease   . GERD (gastroesophageal reflux disease)   . Ischemic cardiomyopathy 10/2017  . Myocardial infarction acute (HCC) 10/2017    Past Surgical History:  Procedure Laterality Date  . CORONARY STENT INTERVENTION N/A 11/29/2017   Procedure: CORONARY STENT INTERVENTION;  Surgeon: Lyn Records, MD;  Location: [facility] INVASIVE CV LAB;  Service: Cardiovascular;  Laterality: N/A;  . CORONARY/GRAFT ACUTE MI REVASCULARIZATION N/A 11/29/2017   Procedure: Coronary/Graft Acute MI Revascularization;  Surgeon: Lyn Records, MD;  Location: [facility] INVASIVE CV LAB;  Service: Cardiovascular;  Laterality: N/A;  . LEFT HEART CATH AND CORONARY ANGIOGRAPHY N/A 11/29/2017   Procedure: LEFT HEART CATH AND CORONARY ANGIOGRAPHY;  Surgeon: Lyn Records, MD;  Location: [facility] INVASIVE CV LAB;  Service: Cardiovascular;  Laterality: N/A;  . LUMBAR DISC SURGERY     L5-S1    There were no vitals filed for this visit.  Subjective Assessment - 01/23/18 1346    Subjective  Patient reported feeling "good". Patient demonstrated she is able to bring arm across midline with no pain.     Currently in Pain?  No/denies                       [facility] Adult PT Treatment/Exercise - 01/23/18 0001      Exercises   Exercises  Shoulder      Shoulder Exercises: Prone   Extension  Strengthening;Left;Weights 30 reps    Extension Weight (lbs)  3    Horizontal ABduction 1  Strengthening;Left;Weights;20 reps one set palm down, one set palm up    Horizontal ABduction 1 Weight (lbs)  1    Horizontal ABduction 2  Strengthening;Left;20 reps;Weights one set palm down, one set palm up    Horizontal ABduction 2 Weight (lbs)  1    Other Prone Exercises  row with 3# x 30      Shoulder Exercises: Sidelying   External Rotation  Strengthening;20 reps;Left    External Rotation Weight (lbs)  2      Shoulder Exercises: Standing   Horizontal ABduction  --    Row  Strengthening;20 reps PINK XTS      Shoulder Exercises: ROM/Strengthening   UBE (Upper Arm Bike)  90 RPM 10 minutes 5 fowrard/5 backward      Modalities   Modalities  Electrical Stimulation      Moist Heat Therapy   Number Minutes Moist Heat  15 Minutes    Moist Heat Location  Shoulder      Electrical Stimulation   Electrical Stimulation Location  L  shoulder    Electrical Stimulation Action  IFC    Electrical Stimulation Parameters  80-150 Hz x15    Electrical Stimulation Goals  Pain      Manual Therapy   Manual Therapy  --    Manual therapy comments  --                  PT Long Term Goals - 01/03/18 1407      PT LONG TERM GOAL #1   Title  Independent with a HEP.    Time  6    Period  Weeks    Status  Achieved      PT LONG TERM GOAL #2   Title  Patient able to work with pain <= 1/10 in her L shoulder.    Baseline  up to 3/10    Time  6    Period  Weeks    Status  On-going      PT LONG TERM GOAL #3   Title  Patient to report no neurologic sx into LUE with ADLS.    Time  6    Period  Weeks    Status  Achieved      PT LONG TERM GOAL #4   Title  Increase L shoulder strength to 5/5 without pain.    Time  6    Period   Weeks    Status  Achieved            Plan - 01/23/18 1411    Clinical Impression Statement  Patient was able to complete treatment with improved form. Patient did report "clicking" at anterior shoulder along bicipital tendon during prone "y" exercises. Patient instructed to go half range and clicking decreased. Patient reported no pain during clicking episode. Patient stated therapeutic exercises are a "good challenge" that are helping her get stronger. Normal response to modalites upon removal.      Rehab Potential  Excellent    PT Frequency  2x / week    PT Duration  4 weeks    PT Treatment/Interventions  ADLs/Self Care Home Management;Cryotherapy;Electrical Stimulation;Moist Heat;Ultrasound;Therapeutic exercise;Neuromuscular re-education;Patient/family education;Manual techniques;Dry needling;Iontophoresis 4mg /ml Dexamethasone;Taping    PT Next Visit Plan  Continue strengthening posterior shoulder muscles.    Consulted and Agree with Plan of Care  Patient       Patient will benefit from skilled therapeutic intervention in order to improve the following deficits and impairments:  Pain, Decreased strength, Increased muscle spasms, Decreased range of motion, Postural dysfunction  Visit Diagnosis: Acute pain of left shoulder  Muscle weakness (generalized)  Stiffness of left shoulder, not elsewhere classified     Problem List Patient Active Problem List   Diagnosis Date Noted  . GERD (gastroesophageal reflux disease)   . Coronary artery disease   . Allergy   . Acute ST elevation myocardial infarction (STEMI) involving left anterior descending (LAD) coronary artery (HCC) 11/29/2017  . Acute ST elevation myocardial infarction (STEMI) of anterior wall (HCC)   . CAD in native artery   . VF (ventricular fibrillation) (HCC)   . Myocardial infarction acute (HCC) 10/27/2017  . Ischemic cardiomyopathy 10/27/2017  . Cardiac arrest with ventricular fibrillation (HCC) 10/27/2017  .  Left shoulder pain 09/29/2017  . Right elbow pain 01/24/2017  . Ulcer-like dyspepsia 04/04/2012  . Tobacco use 04/04/2012   Guss Bunde, PT, DPT 01/23/2018, 3:53 PM  [facility] [address] [place], [place], [address] Phone: [phone]   Fax:  [phone]  Name: Alayasia Breeding MRN: [id number] Date of Birth: 05-15-1966

## 2018-01-26 ENCOUNTER — Ambulatory Visit: Payer: Federal, State, Local not specified - PPO | Admitting: Physical Therapy

## 2018-01-26 ENCOUNTER — Encounter: Payer: Self-pay | Admitting: Physical Therapy

## 2018-01-26 DIAGNOSIS — M6281 Muscle weakness (generalized): Secondary | ICD-10-CM

## 2018-01-26 DIAGNOSIS — M25521 Pain in right elbow: Secondary | ICD-10-CM

## 2018-01-26 DIAGNOSIS — M25612 Stiffness of left shoulder, not elsewhere classified: Secondary | ICD-10-CM

## 2018-01-26 DIAGNOSIS — M25512 Pain in left shoulder: Secondary | ICD-10-CM | POA: Diagnosis not present

## 2018-01-26 NOTE — Therapy (Signed)
[facility] [address] [place], [place], [address] Phone: [phone]   Fax:  [phone]  Physical Therapy Treatment  Patient Details  Name: Sarah Fox MRN: [id number] Date of Birth: [birth year] Referring Provider: Dr. Norton Blizzard   Encounter Date: 01/26/2018  PT End of Session - 01/26/18 1816    Visit Number  14    Number of Visits  15    Date for PT Re-Evaluation  01/31/18    PT Start Time  0400    PT Stop Time  0446    PT Time Calculation (min)  46 min    Activity Tolerance  Patient tolerated treatment well    Behavior During Therapy  [facility] for tasks assessed/performed       Past Medical History:  Diagnosis Date  . Allergy   . Cardiac arrest with ventricular fibrillation (HCC) 10/2017   with acute MI and hypokalemia  . Coronary artery disease   . GERD (gastroesophageal reflux disease)   . Ischemic cardiomyopathy 10/2017  . Myocardial infarction acute (HCC) 10/2017    Past Surgical History:  Procedure Laterality Date  . CORONARY STENT INTERVENTION N/A 11/29/2017   Procedure: CORONARY STENT INTERVENTION;  Surgeon: Lyn Records, MD;  Location: [facility] INVASIVE CV LAB;  Service: Cardiovascular;  Laterality: N/A;  . CORONARY/GRAFT ACUTE MI REVASCULARIZATION N/A 11/29/2017   Procedure: Coronary/Graft Acute MI Revascularization;  Surgeon: Lyn Records, MD;  Location: [facility] INVASIVE CV LAB;  Service: Cardiovascular;  Laterality: N/A;  . LEFT HEART CATH AND CORONARY ANGIOGRAPHY N/A 11/29/2017   Procedure: LEFT HEART CATH AND CORONARY ANGIOGRAPHY;  Surgeon: Lyn Records, MD;  Location: [facility] INVASIVE CV LAB;  Service: Cardiovascular;  Laterality: N/A;  . LUMBAR DISC SURGERY     L5-S1    There were no vitals filed for this visit.  Subjective Assessment - 01/26/18 1808    Subjective  Doing well today.  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    Pain Score  1     Pain Orientation  Left    Pain Descriptors / Indicators  Sore    Pain  Onset  More than a month ago                      [facility] Adult PT Treatment/Exercise - 01/26/18 0001      Shoulder Exercises: Standing   Other Standing Exercises  UE ranger on wall into flexion, diagonals, CW and CCW x 7 minutes.    Other Standing Exercises  Bodyblade lite x 3 minutes at multiple left shoulder degrees f/b towel on plexiglass x 3 minutes.        Shoulder Exercises: ROM/Strengthening   UBE (Upper Arm Bike)  90 RPM's x 10 minutes.      Programme researcher, broadcasting/film/video Location  -- Left ACJ.    Electrical Stimulation Action  Pre-mod.    Electrical Stimulation Parameters  80-150 hz x 15 minutes.    Electrical Stimulation Goals  Pain                  PT Long Term Goals - 01/03/18 1407      PT LONG TERM GOAL #1   Title  Independent with a HEP.    Time  6    Period  Weeks    Status  Achieved      PT LONG TERM GOAL #2   Title  Patient able to work with pain <= 1/10 in  her L shoulder.    Baseline  up to 3/10    Time  6    Period  Weeks    Status  On-going      PT LONG TERM GOAL #3   Title  Patient to report no neurologic sx into LUE with ADLS.    Time  6    Period  Weeks    Status  Achieved      PT LONG TERM GOAL #4   Title  Increase L shoulder strength to 5/5 without pain.    Time  6    Period  Weeks    Status  Achieved            Plan - 01/26/18 1814    Clinical Impression Statement  Patient had localized pain over left ACJ today.  No pain after treatment.       Patient will benefit from skilled therapeutic intervention in order to improve the following deficits and impairments:  Pain, Decreased strength, Increased muscle spasms, Decreased range of motion, Postural dysfunction  Visit Diagnosis: Acute pain of left shoulder  Muscle weakness (generalized)  Stiffness of left shoulder, not elsewhere classified  Pain in right elbow     Problem List Patient Active Problem List   Diagnosis Date Noted   . GERD (gastroesophageal reflux disease)   . Coronary artery disease   . Allergy   . Acute ST elevation myocardial infarction (STEMI) involving left anterior descending (LAD) coronary artery (HCC) 11/29/2017  . Acute ST elevation myocardial infarction (STEMI) of anterior wall (HCC)   . CAD in native artery   . VF (ventricular fibrillation) (HCC)   . Myocardial infarction acute (HCC) 10/27/2017  . Ischemic cardiomyopathy 10/27/2017  . Cardiac arrest with ventricular fibrillation (HCC) 10/27/2017  . Left shoulder pain 09/29/2017  . Right elbow pain 01/24/2017  . Ulcer-like dyspepsia 04/04/2012  . Tobacco use 04/04/2012    Mylea Roarty, [place] MPT 01/26/2018, 6:17 PM  [facility] [address] [place], [place], [address] Phone: [phone]   Fax:  [phone]  Name: Marcus Santin MRN: [id number] Date of Birth: 05/03/1966

## 2018-01-28 ENCOUNTER — Other Ambulatory Visit: Payer: Self-pay | Admitting: Family

## 2018-01-30 ENCOUNTER — Ambulatory Visit: Payer: Federal, State, Local not specified - PPO | Attending: Family Medicine | Admitting: Physical Therapy

## 2018-01-30 ENCOUNTER — Encounter: Payer: Self-pay | Admitting: Family Medicine

## 2018-01-30 ENCOUNTER — Ambulatory Visit: Payer: Federal, State, Local not specified - PPO | Admitting: Family Medicine

## 2018-01-30 DIAGNOSIS — M25521 Pain in right elbow: Secondary | ICD-10-CM | POA: Diagnosis not present

## 2018-01-30 DIAGNOSIS — M25512 Pain in left shoulder: Secondary | ICD-10-CM

## 2018-01-30 NOTE — Assessment & Plan Note (Signed)
2/2 lateral epicondylitis.  Clinically resolved at this point.  F/u prn.

## 2018-01-30 NOTE — Assessment & Plan Note (Signed)
2/2 impingement.  Continue physical therapy and transition to home exercise program.  Tylenol if needed.  Consider using nitro patches for this also - can f/u in 6 weeks for this if needed otherwise f/u prn.

## 2018-01-30 NOTE — Patient Instructions (Signed)
Abduction: Horizontal - Prone (Dumbbell)   Lie with right arm hanging down. Lift arm out to side, thumb up. Repeat _10___ times per set. Do __1-3__ sets per session. Do __1__ sessions per day. Use 0-1____ lb weight.  Repeat with palm facing down toward floor.  Straight Arm Lift: Prone   Arm straight out diagonally. Keeping thumb up, lift arm. Keep pelvis still. You can add light weight. Do _10-30__ times, 1_ times per day.  Also perform with palm facing the floor.  Extension - Prone (Dumbbell)    Lie with left arm hanging off side of bed. Lift hand back and up. Repeat _10___ times per set. Do __1-3__ sets per session. Do _3__ sessions per week. Use __3__ lb weight.  Prone Dumbbell Row    Lie on your stomach on your bed with left arm hanging off the side. Lift _4-5__ lb wt, pulling shoulder blades together. Do _3__ sets of _10__ repetitions.   D2 Flexion / Extension (Single Arm)    Hold 1-2 pound wt in left hand at opposite hip, palm down. Bring ball up and out, palm up at end of motion. Reverse motion to return. Repeat 10__ times. Repeat with other hand for set. Rest 20__ seconds after set. Do _3_ sets per session.  Copyright  VHI. All rights reserved.   Solon Palm, PT 01/30/18 1:43 PM; [facility] [address] [place], [place], [address] Phone: [phone]   Fax:  [phone]
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
## 2018-01-30 NOTE — Therapy (Signed)
[facility] [place], [place], [address] Phone: [phone]   Fax:  [phone]  Physical Therapy Treatment  Patient Details  Name: Sarah Fox MRN: [id number] Date of Birth: 04/17/1966 Referring Provider: Dr. Karlton Lemon   Encounter Date: 01/30/2018  PT End of Session - 01/30/18 1305    Visit Number  15    Number of Visits  15    Date for PT Re-Evaluation  01/31/18    PT Start Time  4132    PT Stop Time  1357    PT Time Calculation (min)  52 min    Activity Tolerance  Patient tolerated treatment well    Behavior During Therapy  [facility] for tasks assessed/performed       Past Medical History:  Diagnosis Date  . Allergy   . Cardiac arrest with ventricular fibrillation ([place]) 10/2017   with acute MI and hypokalemia  . Coronary artery disease   . GERD (gastroesophageal reflux disease)   . Ischemic cardiomyopathy 10/2017  . Myocardial infarction acute ([place]) 10/2017    Past Surgical History:  Procedure Laterality Date  . CORONARY STENT INTERVENTION N/A 11/29/2017   Procedure: CORONARY STENT INTERVENTION;  Surgeon: Belva Crome, MD;  Location: [place] CV LAB;  Service: Cardiovascular;  Laterality: N/A;  . CORONARY/GRAFT ACUTE MI REVASCULARIZATION N/A 11/29/2017   Procedure: Coronary/Graft Acute MI Revascularization;  Surgeon: Belva Crome, MD;  Location: [place] CV LAB;  Service: Cardiovascular;  Laterality: N/A;  . LEFT HEART CATH AND CORONARY ANGIOGRAPHY N/A 11/29/2017   Procedure: LEFT HEART CATH AND CORONARY ANGIOGRAPHY;  Surgeon: Belva Crome, MD;  Location: [place] CV LAB;  Service: Cardiovascular;  Laterality: N/A;  . LUMBAR DISC SURGERY     L5-S1    There were no vitals filed for this visit.  Subjective Assessment - 01/30/18 1306    Subjective  Patient states that her only pain is with horizontal adduction and pain is at 1/10    Patient Stated Goals  to get rid of shoulder pain    Currently  in Pain?  No/denies                      [facility] Adult PT Treatment/Exercise - 01/30/18 0001      Shoulder Exercises: Supine   Other Supine Exercises  D1 and D2 flex/ext with 1 # wt x 20 ea      Shoulder Exercises: Prone   Extension  Strengthening;Left;Weights 30 reps    Extension Weight (lbs)  3    Horizontal ABduction 1  Strengthening;Left;Weights;20 reps one set palm down, one set thumb up no weight    Horizontal ABduction 1 Weight (lbs)  1    Horizontal ABduction 2  Strengthening;Left;20 reps;Weights one set palm down, one set palm up    Horizontal ABduction 2 Weight (lbs)  1    Other Prone Exercises  row with 3# x 30      Shoulder Exercises: Standing   Protraction  Strengthening;Left;20 reps;Theraband    Theraband Level (Shoulder Protraction)  Level 2 (Red)    External Rotation  Strengthening;Left;20 reps;Theraband    Theraband Level (Shoulder External Rotation)  Level 2 (Red)    Internal Rotation  Strengthening;Left;20 reps;Theraband    Theraband Level (Shoulder Internal Rotation)  Level 2 (Red)      Shoulder Exercises: ROM/Strengthening   UBE (Upper Arm Bike)  60 RPM's x 8 minutes.      Modalities  Modalities  Teacher, English as a foreign language Location  L shoulder premod 80-150 Hz x 84mn    Electrical Stimulation Goals  Pain             PT Education - 01/30/18 1347    Education provided  Yes    Education Details  HEP    Person(s) Educated  Patient    Methods  Explanation;Demonstration;Handout    Comprehension  Verbalized understanding;Returned demonstration          PT Long Term Goals - 01/30/18 1310      PT LONG TERM GOAL #1   Title  Independent with a HEP.    Period  Weeks    Status  Achieved      PT LONG TERM GOAL #2   Title  Patient able to work with pain <= 1/10 in her L shoulder.    Baseline  1/10 at most    Time  6    Period  Weeks    Status  Achieved      PT LONG TERM GOAL #3    Title  Patient to report no neurologic sx into LUE with ADLS.    Time  6    Period  Weeks    Status  Achieved      PT LONG TERM GOAL #4   Title  Increase L shoulder strength to 5/5 without pain.    Time  6    Period  Weeks    Status  Achieved            Plan - 01/30/18 1344    Clinical Impression Statement  Patient presents today c/o minimal pain in L shoulder overall. She continues to demo some weakness with TE, but should progress if compliant with HEP. All LTGs have been met and pt is pleased with current functional level.    Rehab Potential  Excellent    PT Frequency  2x / week    PT Duration  4 weeks    PT Treatment/Interventions  ADLs/Self Care Home Management;Cryotherapy;Electrical Stimulation;Moist Heat;Ultrasound;Therapeutic exercise;Neuromuscular re-education;Patient/family education;Manual techniques;Dry needling;Iontophoresis '4mg'$ /ml Dexamethasone;Taping    PT Next Visit Plan  see d/c summary    PT Home Exercise Plan  RW4, prone Tand Y, ext and rows, supine D2 flexion/extension    Consulted and Agree with Plan of Care  Patient       Patient will benefit from skilled therapeutic intervention in order to improve the following deficits and impairments:  Pain, Decreased strength, Increased muscle spasms, Decreased range of motion, Postural dysfunction  Visit Diagnosis: Acute pain of left shoulder     Problem List Patient Active Problem List   Diagnosis Date Noted  . GERD (gastroesophageal reflux disease)   . Coronary artery disease   . Allergy   . Acute ST elevation myocardial infarction (STEMI) involving left anterior descending (LAD) coronary artery ([place] 11/29/2017  . Acute ST elevation myocardial infarction (STEMI) of anterior wall ([place]   . CAD in native artery   . VF (ventricular fibrillation) ([place]   . Myocardial infarction acute ([place] 10/27/2017  . Ischemic cardiomyopathy 10/27/2017  . Cardiac arrest with ventricular fibrillation ([place] 10/27/2017   . Left shoulder pain 09/29/2017  . Right elbow pain 01/24/2017  . Ulcer-like dyspepsia 04/04/2012  . Tobacco use 04/04/2012    JMadelyn FlavorsPT 01/30/2018, 1:49 PM  [facility] [address]Phone: [phone]  Fax:  [phone]  Name: Shevelle Smither MRN: [id number] Date of Birth: [birth year]   PHYSICAL THERAPY DISCHARGE SUMMARY  Visits from Start of Care: 15  Current functional level related to goals / functional outcomes: See Above   Remaining deficits: See Above   Education / Equipment: HEP Plan: Patient agrees to discharge.  Patient goals were met. Patient is being discharged due to meeting the stated rehab goals.  ?????    Madelyn Flavors, PT 01/30/18 1:50 PM; [facility] [place], [place], [address] Phone: [phone]   Fax:  [phone]

## 2018-01-30 NOTE — Patient Instructions (Signed)
When you feel comfortable you can transition from physical therapy to a home exercise program. Consider using the nitro patches for this left shoulder. Injection is a consideration here but I wouldn't recommend this at this point. Follow up with me as needed if you're doing well.

## 2018-02-02 ENCOUNTER — Encounter: Payer: Self-pay | Admitting: Physical Therapy

## 2018-03-01 ENCOUNTER — Other Ambulatory Visit: Payer: Self-pay | Admitting: Family Medicine

## 2018-03-01 DIAGNOSIS — Z1231 Encounter for screening mammogram for malignant neoplasm of breast: Secondary | ICD-10-CM

## 2018-03-13 ENCOUNTER — Ambulatory Visit (HOSPITAL_COMMUNITY): Payer: Federal, State, Local not specified - PPO | Attending: Cardiovascular Disease

## 2018-03-13 ENCOUNTER — Other Ambulatory Visit: Payer: Self-pay

## 2018-03-13 DIAGNOSIS — I252 Old myocardial infarction: Secondary | ICD-10-CM | POA: Diagnosis not present

## 2018-03-13 DIAGNOSIS — Z87891 Personal history of nicotine dependence: Secondary | ICD-10-CM | POA: Diagnosis not present

## 2018-03-13 DIAGNOSIS — I2109 ST elevation (STEMI) myocardial infarction involving other coronary artery of anterior wall: Secondary | ICD-10-CM | POA: Diagnosis not present

## 2018-03-13 DIAGNOSIS — I422 Other hypertrophic cardiomyopathy: Secondary | ICD-10-CM | POA: Insufficient documentation

## 2018-03-13 DIAGNOSIS — I4901 Ventricular fibrillation: Secondary | ICD-10-CM | POA: Insufficient documentation

## 2018-03-21 ENCOUNTER — Ambulatory Visit
Admission: RE | Admit: 2018-03-21 | Discharge: 2018-03-21 | Disposition: A | Discharge status: Home / Self Care | Payer: Federal, State, Local not specified - PPO | Source: Ambulatory Visit | Attending: Family Medicine | Admitting: Family Medicine

## 2018-03-21 DIAGNOSIS — Z1231 Encounter for screening mammogram for malignant neoplasm of breast: Secondary | ICD-10-CM

## 2018-03-26 NOTE — Progress Notes (Signed)
Cardiology Office Note    Date:  03/27/2018   ID:  Sarah Fox, Sarah Fox 11/06/1966, MRN [id number]  PCP:  Junie Spencer, FNP  Cardiologist: Lesleigh Noe, MD   Chief Complaint  Patient presents with  . Coronary Artery Disease    History of Present Illness:  Sarah Fox is a 52 y.o. female with CAD, anterior MI accompanied by VF, successful resuscitation, acute systolic heart failure with recovery to HFpEF (EF 45% 02/2018).  [name] is back at work.  She is doing well.  No angina but concerned about her recent echo..  She feels fatigued frequently.  We stopped lisinopril therapy because of low blood pressure.  She denies dyspnea.  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  She is stop smoking.  She switched from beer (4 cans/day) to 1 bottle of wine per day.    Past Medical History:  Diagnosis Date  . Allergy   . Cardiac arrest with ventricular fibrillation (HCC) 10/2017   with acute MI and hypokalemia  . Coronary artery disease   . GERD (gastroesophageal reflux disease)   . Ischemic cardiomyopathy 10/2017  . Myocardial infarction acute (HCC) 10/2017    Past Surgical History:  Procedure Laterality Date  . CORONARY STENT INTERVENTION N/A 11/29/2017   Procedure: CORONARY STENT INTERVENTION;  Surgeon: Lyn Records, MD;  Location: [facility] INVASIVE CV LAB;  Service: Cardiovascular;  Laterality: N/A;  . CORONARY/GRAFT ACUTE MI REVASCULARIZATION N/A 11/29/2017   Procedure: Coronary/Graft Acute MI Revascularization;  Surgeon: Lyn Records, MD;  Location: [facility] INVASIVE CV LAB;  Service: Cardiovascular;  Laterality: N/A;  . LEFT HEART CATH AND CORONARY ANGIOGRAPHY N/A 11/29/2017   Procedure: LEFT HEART CATH AND CORONARY ANGIOGRAPHY;  Surgeon: Lyn Records, MD;  Location: [facility] INVASIVE CV LAB;  Service: Cardiovascular;  Laterality: N/A;  . LUMBAR DISC SURGERY     L5-S1    Current Medications: Outpatient Medications Prior to Visit  Medication Sig  Dispense Refill  . aspirin EC 81 MG tablet Take 1 tablet (81 mg total) by mouth daily.    Marland Kitchen atorvastatin (LIPITOR) 80 MG tablet Take 1 tablet (80 mg total) by mouth daily at 6 PM. 90 tablet 3  . escitalopram (LEXAPRO) 10 MG tablet Take 1 tablet (10 mg total) by mouth daily. 90 tablet 3  . nitroGLYCERIN (NITROSTAT) 0.4 MG SL tablet Place 1 tablet (0.4 mg total) under the tongue every 5 (five) minutes as needed for chest pain. 25 tablet 12  . omeprazole (PRILOSEC) 40 MG capsule TAKE 1 CAPSULE BY MOUTH EVERY DAY 90 capsule 1  . ticagrelor (BRILINTA) 90 MG TABS tablet Take 1 tablet (90 mg total) by mouth 2 (two) times daily. 60 tablet 11  . carvedilol (COREG) 3.125 MG tablet Take 1 tablet (3.125 mg total) by mouth 2 (two) times daily with a meal. 60 tablet 6   No facility-administered medications prior to visit.      Allergies:   Patient has no known allergies.   Social History   Socioeconomic History  . Marital status: Married    Spouse name: Not on file  . Number of children: 1  . Years of education: Not on file  . Highest education level: Not on file  Occupational History  . 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 Financial resource strain: Not on file  . Food insecurity:    Worry: Not  on file    Inability: Not on file  . Transportation needs:    Medical: Not on file    Non-medical: Not on file  Tobacco Use  . Smoking status: Former Smoker    Packs/day: 0.50    Years: 20.00    Pack years: 10.00    Last attempt to quit: 11/28/2017    Years since quitting: 0.3  . Smokeless tobacco: Never Used  Substance and Sexual Activity  . Alcohol use: Yes    Comment: 4 drinks daily  . Drug use: No  . Sexual activity: Yes    Comment: ptp states she cannot be pregnant, but not using anything to not get prenant  Lifestyle  . Physical activity:    Days per week: Not on file    Minutes per session: Not on file  . Stress: Very much  Relationships  . Social  connections:    Talks on phone: Not on file    Gets together: Not on file    Attends religious service: Not on file    Active member of club or organization: Not on file    Attends meetings of clubs or organizations: Not on file    Relationship status: Not on file  Other Topics Concern  . Not on file  Social History Narrative   Married with one child.     Family History:  The patient's family history includes Breast cancer (age of onset: 11) in her mother and sister; Heart disease in her brother.   ROS:   Please see the history of present illness.    Easy bruising, excessive fatigue.  Lately according to her husband. All other systems reviewed and are negative.   PHYSICAL EXAM:   VS:  BP 116/64   Pulse 64   Ht 5\' 5"  (1.651 m)   Wt 136 lb 9.6 oz (62 kg)   BMI 22.73 kg/m    GEN: Well nourished, well developed, in no acute distress  HEENT: normal  Neck: no JVD, carotid bruits, or masses Cardiac: RRR; no murmurs, rubs, or gallops,no edema  Respiratory:  clear to auscultation bilaterally, normal work of breathing GI: soft, nontender, nondistended, + BS MS: no deformity or atrophy  Skin: warm and dry, no rash Neuro:  Alert and Oriented x 3, Strength and sensation are intact Psych: euthymic mood, full affect  Wt Readings from Last 3 Encounters:  03/27/18 136 lb 9.6 oz (62 kg)  01/30/18 120 lb (54.4 kg)  01/10/18 125 lb 12.8 oz (57.1 kg)      Studies/Labs Reviewed:   EKG:  EKG  none  Recent Labs: 11/30/2017: Magnesium 1.8 12/08/2017: BUN 9; Creatinine, Ser 0.59; Hemoglobin 13.3; Platelets 362; Potassium 4.5; Sodium 141 01/10/2018: ALT 21   Lipid Panel    Component Value Date/Time   CHOL 110 01/10/2018 1548   TRIG 140 01/10/2018 1548   HDL 61 01/10/2018 1548   CHOLHDL 1.8 01/10/2018 1548   CHOLHDL 2.3 11/30/2017 0012   VLDL 12 11/30/2017 0012   LDLCALC 21 01/10/2018 1548    Additional studies/ records that were reviewed today include:: 2D Doppler  echocardiogram March 13, 2018: Study Conclusions   - Left ventricle: The cavity size was normal. Wall thickness was   normal. Systolic function was mildly to moderately reduced. The   estimated ejection fraction was in the range of 40% to 45%. Left   ventricular diastolic function parameters were normal for the   patient&'s age.    ASSESSMENT:  1. CAD in native artery   2. Ischemic cardiomyopathy   3. Chronic combined systolic and diastolic heart failure (HCC)   4. Acute ST elevation myocardial infarction (STEMI) of anterior wall (HCC)   5. VF (ventricular fibrillation) (HCC)      PLAN:  In order of problems listed above:  1. Proximal LAD occlusion leading to ventricular fibrillation and requiring angioplasty and stenting in December 30, 2016.  She has recovered significantly with EF increasing from less than 30% to EF in the 40-50% range.  She has no heart failure symptoms.  She is on dual antiplatelet therapy without complications. 2. Mild reduction in LV systolic function with EF 40-50%.  Not yet on optimized heart failure regimen.  Will resume low-dose lisinopril at bedtime, and discontinue carvedilol opting for metoprolol succinate 12.5 mg/day also at bedtime. 3. No evidence of volume overload or symptoms of angina. 4. LDL cholesterol target is less than 70.  Last evaluated in January and was 21.  Plan to repeat on return in 3-4 months.  We will decrease intensity of statin therapy at that time if LDL is less than 70.  It is safe for them to start traveling and become more physically active.  I encouraged walking.  She needs to cut way back on alcohol.  Needs less than or equal to 1 drink per day.  I applauded abstinence from cigarettes and hope that she can continue.  We discussed diet which should be heart healthy, low in saturated fat, high in fiber and green vegetables, low in refined simple carbohydrates.    Medication Adjustments/Labs and Tests Ordered: Current medicines  are reviewed at length with the patient today.  Concerns regarding medicines are outlined above.  Medication changes, Labs and Tests ordered today are listed in the Patient Instructions below. Patient Instructions  Medication Instructions:  1) DISCONTINUE Carvedilol 2) START Lisinopril 2.5mg  once daily 3) START Metoprolol Succinate 12.5mg  once daily at bedtime  Labwork: You will have labs drawn at your next visit.  You will need to be fasting for these labs if possible.   Testing/Procedures: None  Follow-Up: Your physician recommends that you schedule a follow-up appointment in: 4 months with Dr. Katrinka Blazing.    Any Other Special Instructions Will Be Listed Below (If Applicable).     If you need a refill on your cardiac medications before your next appointment, please call your pharmacy.      Signed, Lesleigh Noe, MD  03/27/2018 5:12 PM    [facility] [address] [place], [place], [place]  [address] Phone: [phone]; Fax: [phone]

## 2018-03-27 ENCOUNTER — Ambulatory Visit: Payer: Federal, State, Local not specified - PPO | Admitting: Interventional Cardiology

## 2018-03-27 ENCOUNTER — Encounter: Payer: Self-pay | Admitting: Interventional Cardiology

## 2018-03-27 VITALS — BP 116/64 | HR 64 | Ht 65.0 in | Wt 136.6 lb

## 2018-03-27 DIAGNOSIS — I255 Ischemic cardiomyopathy: Secondary | ICD-10-CM

## 2018-03-27 DIAGNOSIS — I5042 Chronic combined systolic (congestive) and diastolic (congestive) heart failure: Secondary | ICD-10-CM

## 2018-03-27 DIAGNOSIS — E785 Hyperlipidemia, unspecified: Secondary | ICD-10-CM | POA: Diagnosis not present

## 2018-03-27 DIAGNOSIS — I2510 Atherosclerotic heart disease of native coronary artery without angina pectoris: Secondary | ICD-10-CM

## 2018-03-27 MED ORDER — METOPROLOL SUCCINATE ER 25 MG PO TB24: 12.5000 mg | ORAL_TABLET | Freq: Every day | ORAL | 3 refills | Status: DC

## 2018-03-27 MED ORDER — LISINOPRIL 2.5 MG PO TABS: 2.5000 mg | ORAL_TABLET | Freq: Every day | ORAL | 3 refills | Status: DC

## 2018-03-27 NOTE — Patient Instructions (Addendum)
Medication Instructions:  1) DISCONTINUE Carvedilol 2) START Lisinopril 2.5mg  once daily 3) START Metoprolol Succinate 12.5mg  once daily at bedtime  Labwork: You will have labs drawn at your next visit.  You will need to be fasting for these labs if possible.   Testing/Procedures: None  Follow-Up: Your physician recommends that you schedule a follow-up appointment in: 4 months with Dr. Katrinka Blazing.    Any Other Special Instructions Will Be Listed Below (If Applicable).     If you need a refill on your cardiac medications before your next appointment, please call your pharmacy.

## 2018-05-30 ENCOUNTER — Telehealth: Payer: Self-pay | Admitting: Family

## 2018-05-30 NOTE — Telephone Encounter (Signed)
Sarah Fox, please see message below.  There is a release from [employer] but it is addressed to "[facility]" instead of [place], so I hesitated to send them anything.

## 2018-06-05 NOTE — Telephone Encounter (Signed)
I refaxed information to them

## 2018-07-27 ENCOUNTER — Other Ambulatory Visit: Payer: Self-pay | Admitting: Family

## 2018-07-27 NOTE — Telephone Encounter (Signed)
Last seen 12/08/17

## 2018-08-13 NOTE — Progress Notes (Signed)
Cardiology Office Note:    Date:  08/14/2018   ID:  Sarah Fox, Sarah Fox Dec 23, 1966, MRN [id number]  PCP:  Junie Spencer, FNP  Cardiologist:  Lesleigh Noe, MD   Referring MD: Junie Spencer, FNP   Chief Complaint  Patient presents with  . Shortness of Breath  . Cough    History of Present Illness:    Ottie Neglia is a 52 y.o. female with a hx of CAD, anterior MI accompanied by VF, successful resuscitation, acute systolic heart failure with recovery to HFpEF (EF 45% 02/2018).  Since we reinstituted ACE inhibitor therapy there has been a dry/tickle induced cough.  She is also noted significant blood pressure reduction with systolic BP less than 90 on occasion.  No chest discomfort or shortness of breath is been noted.  She denies lower extremity edema.  Easy bruising has been very noticeable.  General quality chest pain or nitroglycerin use.  Past Medical History:  Diagnosis Date  . Acute ST elevation myocardial infarction (STEMI) of anterior wall (HCC)   . Allergy   . CAD in native artery   . Cardiac arrest with ventricular fibrillation (HCC) 10/2017   with acute MI and hypokalemia  . Coronary artery disease   . GERD (gastroesophageal reflux disease)   . Ischemic cardiomyopathy 10/2017  . Myocardial infarction acute (HCC) 10/2017  . VF (ventricular fibrillation) [facility])     Past Surgical History:  Procedure Laterality Date  . CORONARY STENT INTERVENTION N/A 11/29/2017   Procedure: CORONARY STENT INTERVENTION;  Surgeon: Lyn Records, MD;  Location: [facility] INVASIVE CV LAB;  Service: Cardiovascular;  Laterality: N/A;  . CORONARY/GRAFT ACUTE MI REVASCULARIZATION N/A 11/29/2017   Procedure: Coronary/Graft Acute MI Revascularization;  Surgeon: Lyn Records, MD;  Location: [facility] INVASIVE CV LAB;  Service: Cardiovascular;  Laterality: N/A;  . LEFT HEART CATH AND CORONARY ANGIOGRAPHY N/A 11/29/2017   Procedure: LEFT HEART CATH AND CORONARY ANGIOGRAPHY;  Surgeon: Lyn Records,  MD;  Location: [facility] INVASIVE CV LAB;  Service: Cardiovascular;  Laterality: N/A;  . LUMBAR DISC SURGERY     L5-S1    Current Medications: Current Meds  Medication Sig  . aspirin EC 81 MG tablet Take 1 tablet (81 mg total) by mouth daily.  Marland Kitchen atorvastatin (LIPITOR) 80 MG tablet Take 1 tablet (80 mg total) by mouth daily at 6 PM.  . escitalopram (LEXAPRO) 10 MG tablet Take 1 tablet (10 mg total) by mouth daily.  Marland Kitchen lisinopril (PRINIVIL,ZESTRIL) 2.5 MG tablet Take 1 tablet (2.5 mg total) by mouth daily.  . metoprolol succinate (TOPROL-XL) 25 MG 24 hr tablet Take 0.5 tablets (12.5 mg total) by mouth daily.  . nitroGLYCERIN (NITROSTAT) 0.4 MG SL tablet Place 1 tablet (0.4 mg total) under the tongue every 5 (five) minutes as needed for chest pain.  Marland Kitchen omeprazole (PRILOSEC) 40 MG capsule TAKE 1 CAPSULE BY MOUTH EVERY DAY  . ticagrelor (BRILINTA) 90 MG TABS tablet Take 1 tablet (90 mg total) by mouth 2 (two) times daily.     Allergies:   Patient has no known allergies.   Social History   Socioeconomic History  . Marital status: Married    Spouse name: Not on file  . Number of children: 1  . Years of education: Not on file  . Highest education level: Not on file  Occupational History  . 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 Financial resource strain: Not on  file  . Food insecurity:    Worry: Not on file    Inability: Not on file  . Transportation needs:    Medical: Not on file    Non-medical: Not on file  Tobacco Use  . Smoking status: Former Smoker    Packs/day: 0.50    Years: 20.00    Pack years: 10.00    Last attempt to quit: 11/28/2017    Years since quitting: 0.7  . Smokeless tobacco: Never Used  Substance and Sexual Activity  . Alcohol use: Yes    Comment: 4 drinks daily  . Drug use: No  . Sexual activity: Yes    Comment: ptp states she cannot be pregnant, but not using anything to not get prenant  Lifestyle  . Physical activity:    Days  per week: Not on file    Minutes per session: Not on file  . Stress: Very much  Relationships  . Social connections:    Talks on phone: Not on file    Gets together: Not on file    Attends religious service: Not on file    Active member of club or organization: Not on file    Attends meetings of clubs or organizations: Not on file    Relationship status: Not on file  Other Topics Concern  . Not on file  Social History Narrative   Married with one child.     Family History: The patient's family history includes Breast cancer (age of onset: 74) in her mother and sister; Heart disease in her brother. There is no history of Colon cancer, Esophageal cancer, Rectal cancer, or Stomach cancer.  ROS:   Please see the history of present illness.    Increased appetite, complete cessation of tobacco use, increased weight, some pain in her feet.  All other systems reviewed and are negative.  EKGs/Labs/Other Studies Reviewed:    The following studies were reviewed today: 2D Doppler echocardiogram March 2019: Study Conclusions   - Left ventricle: The cavity size was normal. Wall thickness was   normal. Systolic function was mildly to moderately reduced. The   estimated ejection fraction was in the range of 40% to 45%. Left   ventricular diastolic function parameters were normal for the   patient&'s age.   EKG:  EKG is not ordered today.   Recent Labs: 11/30/2017: Magnesium 1.8 12/08/2017: BUN 9; Creatinine, Ser 0.59; Hemoglobin 13.3; Platelets 362; Potassium 4.5; Sodium 141 01/10/2018: ALT 21  Recent Lipid Panel    Component Value Date/Time   CHOL 110 01/10/2018 1548   TRIG 140 01/10/2018 1548   HDL 61 01/10/2018 1548   CHOLHDL 1.8 01/10/2018 1548   CHOLHDL 2.3 11/30/2017 0012   VLDL 12 11/30/2017 0012   LDLCALC 21 01/10/2018 1548    Physical Exam:    VS:  BP 116/80   Pulse 64   Ht 5\' 5"  (1.651 m)   Wt 142 lb (64.4 kg)   BMI 23.63 kg/m     Wt Readings from Last 3  Encounters:  08/14/18 142 lb (64.4 kg)  03/27/18 136 lb 9.6 oz (62 kg)  01/30/18 120 lb (54.4 kg)     GEN:  Well nourished, well developed in no acute distress HEENT: Normal NECK: No JVD. LYMPHATICS: No lymphadenopathy CARDIAC: RRR, no murmur, no gallop, no edema. VASCULAR: 2+ dorsalis pedis and radial pulses.  No bruits. RESPIRATORY:  Clear to auscultation without rales, wheezing or rhonchi  ABDOMEN: Soft, non-tender, non-distended, No pulsatile mass, MUSCULOSKELETAL:  No deformity  SKIN: Warm and dry NEUROLOGIC:  Alert and oriented x 3 PSYCHIATRIC:  Normal affect   ASSESSMENT:    1. CAD in native artery   2. Chronic combined systolic and diastolic heart failure (HCC)   3. Hyperlipidemia LDL goal <70   4. Coronary artery disease involving native coronary artery of native heart without angina pectoris   5. Mixed hyperlipidemia   6. VF (ventricular fibrillation) (HCC)    PLAN:    In order of problems listed above:  1. Clinically stable.  LAD stent after VF arrest while working with resuscitation in the feel.  Has done well since that time.  Continue secondary risk modification including lipid lowering, smoking cessation, surveillance of glycemic control, and blood pressure control. 2. Most recent EF 40 to 45%.  No symptoms of heart failure.  ACE inhibitor is being discontinued because of side effects.  Blood pressures are soft and therefore cannot aggressively use ARB therapy.  Assess LV function in 6 months.  I believe the low EF recording is likely interobserver variability. 3. LDL target is 70.  Liver panel and lipid panel today.  76-month follow-up, 2D Doppler echocardiogram, discontinue lisinopril, switch to clopidogrel, discontinue P2 Y 12 therapy at 1 year anniversary.   Medication Adjustments/Labs and Tests Ordered: Current medicines are reviewed at length with the patient today.  Concerns regarding medicines are outlined above.  No orders of the defined types were  placed in this encounter.  No orders of the defined types were placed in this encounter.   There are no Patient Instructions on file for this visit.   Signed, Lesleigh Noe, MD  08/14/2018 8:40 AM    [facility]

## 2018-08-14 ENCOUNTER — Encounter: Payer: Self-pay | Admitting: Interventional Cardiology

## 2018-08-14 ENCOUNTER — Telehealth: Payer: Self-pay | Admitting: Interventional Cardiology

## 2018-08-14 ENCOUNTER — Ambulatory Visit: Payer: Federal, State, Local not specified - PPO | Admitting: Interventional Cardiology

## 2018-08-14 VITALS — BP 116/80 | HR 64 | Ht 65.0 in | Wt 142.0 lb

## 2018-08-14 DIAGNOSIS — I5042 Chronic combined systolic (congestive) and diastolic (congestive) heart failure: Secondary | ICD-10-CM

## 2018-08-14 DIAGNOSIS — E785 Hyperlipidemia, unspecified: Secondary | ICD-10-CM | POA: Insufficient documentation

## 2018-08-14 DIAGNOSIS — E782 Mixed hyperlipidemia: Secondary | ICD-10-CM | POA: Diagnosis not present

## 2018-08-14 DIAGNOSIS — I4901 Ventricular fibrillation: Secondary | ICD-10-CM

## 2018-08-14 DIAGNOSIS — I2510 Atherosclerotic heart disease of native coronary artery without angina pectoris: Secondary | ICD-10-CM | POA: Diagnosis not present

## 2018-08-14 LAB — COMPREHENSIVE METABOLIC PANEL
A/G RATIO: 1.8 (ref 1.2–2.2)
ALBUMIN: 4.6 g/dL (ref 3.5–5.5)
ALT: 14 IU/L (ref 0–32)
AST: 17 IU/L (ref 0–40)
Alkaline Phosphatase: 73 IU/L (ref 39–117)
BILIRUBIN TOTAL: 0.4 mg/dL (ref 0.0–1.2)
BUN / CREAT RATIO: 13 (ref 9–23)
BUN: 10 mg/dL (ref 6–24)
CHLORIDE: 104 mmol/L (ref 96–106)
CO2: 22 mmol/L (ref 20–29)
Calcium: 9.3 mg/dL (ref 8.7–10.2)
Creatinine, Ser: 0.76 mg/dL (ref 0.57–1.00)
GFR calc Af Amer: 107 mL/min/{1.73_m2} (ref >59)
GFR, EST NON AFRICAN AMERICAN: 92 mL/min/{1.73_m2} (ref >59)
GLOBULIN, TOTAL: 2.5 g/dL (ref 1.5–4.5)
Glucose: 84 mg/dL (ref 65–99)
POTASSIUM: 4.5 mmol/L (ref 3.5–5.2)
SODIUM: 139 mmol/L (ref 134–144)
Total Protein: 7.1 g/dL (ref 6.0–8.5)

## 2018-08-14 LAB — LIPID PANEL
CHOL/HDL RATIO: 2.3 ratio (ref 0.0–4.4)
CHOLESTEROL TOTAL: 106 mg/dL (ref 100–199)
HDL: 47 mg/dL (ref >39)
LDL Calculated: 44 mg/dL (ref 0–99)
TRIGLYCERIDES: 75 mg/dL (ref 0–149)
VLDL Cholesterol Cal: 15 mg/dL (ref 5–40)

## 2018-08-14 MED ORDER — CLOPIDOGREL BISULFATE 75 MG PO TABS: ORAL_TABLET | ORAL | 3 refills | Status: DC

## 2018-08-14 MED ORDER — PANTOPRAZOLE SODIUM 40 MG PO TBEC: 40.0000 mg | DELAYED_RELEASE_TABLET | Freq: Every day | ORAL | 3 refills | Status: DC

## 2018-08-14 NOTE — Telephone Encounter (Signed)
As advised by our Pharmacist [facility], the pt should stop taking Prilosec, and switch to Protonix 40 mg po daily, for this will not interact with the effectiveness of her taking clopidogrel.  Endorsed this to the pts Pharmacist at [facility] in [place]. They verbalized understanding and agrees with this plan.  Pharmacist at [facility] to endorse to the pt.

## 2018-08-14 NOTE — Telephone Encounter (Signed)
Pts pharmacy is calling to clarify interaction between the pts clopidogrel and omeprazole.  Pharmacist at [facility] states that with taking the omeprazole with the clopidogrel, the omeproazole will interfere with effectiveness of the clopidogrel.  Informed the Pharmacist that I will route this concern to our Pharmacy and Dr Katrinka Blazing to review and advise on, and someone from the office will follow-up shortly thereafter.  [facility] Pharmacist verbalized understanding and agrees with this plan.

## 2018-08-14 NOTE — Patient Instructions (Signed)
Medication Instructions:  1. STOP LISINOPRIL  2. FINISH YOUR BOTTLE OF BRILINTA; ONCE FINISHED START PLAVIX ; SEE # 3  3. START PLAVIX 75 MG; DIRECTIONS WILL BE TO TAKE 4 TABLETS ON DAY 1 = 300 MG ; BEGINNING DAY 2 YOU WILL BE ON 1 TABLET DAILY = 75  MG DAILY  Labwork: TODAY CMET, LIPIDS  Testing/Procedures: Your physician has requested that you have an echocardiogram. Echocardiography is a painless test that uses sound waves to create images of your heart. It provides your doctor with information about the size and shape of your heart and how well your heart's chambers and valves are working. This procedure takes approximately one hour. There are no restrictions for this procedure. THIS IS TO BE DONE IN 11/2018    Follow-Up: DR. Katrinka Blazing 12/2018  Any Other Special Instructions Will Be Listed Below (If Applicable).     If you need a refill on your cardiac medications before your next appointment, please call your pharmacy.

## 2018-08-14 NOTE — Telephone Encounter (Signed)
There is a theoretical interaction between Plavix and Prilosec for decreased Plavix efficacy. Recommend switching Prilosec to Protonix 40mg  daily to avoid interaction.

## 2018-08-14 NOTE — Telephone Encounter (Signed)
New message    Pt c/o medication issue:  1. Name of Medication: clopidogrel (PLAVIX) 75 MG tablet  2. How are you currently taking this medication (dosage and times per day)?   3. Are you having a reaction (difficulty breathing--STAT)? NO  4. What is your medication issue? Pharmacy calling for clarification

## 2018-08-18 ENCOUNTER — Telehealth: Payer: Self-pay | Admitting: *Deleted

## 2018-08-18 MED ORDER — ATORVASTATIN CALCIUM 40 MG PO TABS: 40.0000 mg | ORAL_TABLET | Freq: Every day | ORAL | 3 refills | Status: DC

## 2018-08-18 NOTE — Telephone Encounter (Signed)
-----   Message from Lyn Records, MD sent at 08/18/2018  9:50 AM EDT ----- Let the patient know lipids are excellent. Decrease Atorvastatin to 40 mg daily. A copy will be sent to Junie Spencer, FNP

## 2018-08-18 NOTE — Telephone Encounter (Signed)
Spoke with pt and went over results and recommendations per Dr. Smith.  Pt verbalized understanding and was in agreement with this plan.  

## 2018-08-24 ENCOUNTER — Observation Stay (HOSPITAL_COMMUNITY)
Admission: EM | Admit: 2018-08-24 | Discharge: 2018-08-25 | Disposition: A | Discharge status: Home / Self Care | Payer: Federal, State, Local not specified - PPO | Attending: Interventional Cardiology | Admitting: Interventional Cardiology

## 2018-08-24 ENCOUNTER — Encounter (HOSPITAL_COMMUNITY): Payer: Self-pay | Admitting: *Deleted

## 2018-08-24 ENCOUNTER — Other Ambulatory Visit: Payer: Self-pay

## 2018-08-24 ENCOUNTER — Ambulatory Visit (INDEPENDENT_AMBULATORY_CARE_PROVIDER_SITE_OTHER): Payer: Federal, State, Local not specified - PPO | Admitting: Nurse Practitioner

## 2018-08-24 ENCOUNTER — Encounter: Payer: Self-pay | Admitting: Nurse Practitioner

## 2018-08-24 ENCOUNTER — Emergency Department (HOSPITAL_COMMUNITY): Payer: Federal, State, Local not specified - PPO

## 2018-08-24 VITALS — BP 120/68 | HR 61 | Temp 97.1°F | Wt 143.0 lb

## 2018-08-24 DIAGNOSIS — R079 Chest pain, unspecified: Secondary | ICD-10-CM

## 2018-08-24 DIAGNOSIS — I5043 Acute on chronic combined systolic (congestive) and diastolic (congestive) heart failure: Secondary | ICD-10-CM | POA: Insufficient documentation

## 2018-08-24 DIAGNOSIS — E876 Hypokalemia: Secondary | ICD-10-CM | POA: Diagnosis not present

## 2018-08-24 DIAGNOSIS — I252 Old myocardial infarction: Secondary | ICD-10-CM | POA: Insufficient documentation

## 2018-08-24 DIAGNOSIS — Z7982 Long term (current) use of aspirin: Secondary | ICD-10-CM | POA: Insufficient documentation

## 2018-08-24 DIAGNOSIS — E039 Hypothyroidism, unspecified: Secondary | ICD-10-CM | POA: Diagnosis not present

## 2018-08-24 DIAGNOSIS — Z7902 Long term (current) use of antithrombotics/antiplatelets: Secondary | ICD-10-CM | POA: Insufficient documentation

## 2018-08-24 DIAGNOSIS — Z955 Presence of coronary angioplasty implant and graft: Secondary | ICD-10-CM | POA: Diagnosis not present

## 2018-08-24 DIAGNOSIS — E785 Hyperlipidemia, unspecified: Secondary | ICD-10-CM | POA: Diagnosis not present

## 2018-08-24 DIAGNOSIS — Z87891 Personal history of nicotine dependence: Secondary | ICD-10-CM | POA: Diagnosis not present

## 2018-08-24 DIAGNOSIS — Z8249 Family history of ischemic heart disease and other diseases of the circulatory system: Secondary | ICD-10-CM | POA: Insufficient documentation

## 2018-08-24 DIAGNOSIS — I2510 Atherosclerotic heart disease of native coronary artery without angina pectoris: Secondary | ICD-10-CM | POA: Diagnosis not present

## 2018-08-24 DIAGNOSIS — I4901 Ventricular fibrillation: Secondary | ICD-10-CM | POA: Insufficient documentation

## 2018-08-24 DIAGNOSIS — K219 Gastro-esophageal reflux disease without esophagitis: Secondary | ICD-10-CM | POA: Insufficient documentation

## 2018-08-24 DIAGNOSIS — R0789 Other chest pain: Principal | ICD-10-CM | POA: Insufficient documentation

## 2018-08-24 LAB — T4, FREE: Free T4: 0.72 ng/dL — ABNORMAL LOW (ref 0.82–1.77)

## 2018-08-24 LAB — CBC
HEMATOCRIT: 40.2 % (ref 36.0–46.0)
HEMATOCRIT: 40.6 % (ref 36.0–46.0)
Hemoglobin: 12.8 g/dL (ref 12.0–15.0)
Hemoglobin: 13.1 g/dL (ref 12.0–15.0)
MCH: 30.5 pg (ref 26.0–34.0)
MCH: 30.8 pg (ref 26.0–34.0)
MCHC: 31.8 g/dL (ref 30.0–36.0)
MCHC: 32.3 g/dL (ref 30.0–36.0)
MCV: 95.7 fL (ref 78.0–100.0)
MCV: 95.3 fL (ref 78.0–100.0)
PLATELETS: 346 10*3/uL (ref 150–400)
Platelets: 331 10*3/uL (ref 150–400)
RBC: 4.2 MIL/uL (ref 3.87–5.11)
RBC: 4.26 MIL/uL (ref 3.87–5.11)
RDW: 13.1 % (ref 11.5–15.5)
RDW: 13.1 % (ref 11.5–15.5)
WBC: 8.6 10*3/uL (ref 4.0–10.5)
WBC: 9.2 10*3/uL (ref 4.0–10.5)

## 2018-08-24 LAB — I-STAT BETA HCG BLOOD, ED (MC, WL, AP ONLY)

## 2018-08-24 LAB — BASIC METABOLIC PANEL
ANION GAP: 9 (ref 5–15)
BUN: 8 mg/dL (ref 6–20)
CHLORIDE: 105 mmol/L (ref 98–111)
CO2: 27 mmol/L (ref 22–32)
Calcium: 9.2 mg/dL (ref 8.9–10.3)
Creatinine, Ser: 0.86 mg/dL (ref 0.44–1.00)
Glucose, Bld: 109 mg/dL — ABNORMAL HIGH (ref 70–99)
POTASSIUM: 4 mmol/L (ref 3.5–5.1)
SODIUM: 141 mmol/L (ref 135–145)

## 2018-08-24 LAB — TROPONIN I

## 2018-08-24 LAB — TSH: TSH: 18.118 u[IU]/mL — ABNORMAL HIGH (ref 0.350–4.500)

## 2018-08-24 LAB — HEMOGLOBIN A1C
Hgb A1c MFr Bld: 5.2 % (ref 4.8–5.6)
MEAN PLASMA GLUCOSE: 102.54 mg/dL

## 2018-08-24 LAB — I-STAT TROPONIN, ED: Troponin i, poc: 0.01 ng/mL (ref 0.00–0.08)

## 2018-08-24 LAB — CREATININE, SERUM
Creatinine, Ser: 0.81 mg/dL (ref 0.44–1.00)
GFR calc Af Amer: >60 mL/min (ref >60)

## 2018-08-24 MED ORDER — ASPIRIN 300 MG RE SUPP
300.0000 mg | RECTAL | Status: DC
  Filled 2018-08-24: qty 1

## 2018-08-24 MED ORDER — ACETAMINOPHEN 325 MG PO TABS: 650.0000 mg | ORAL_TABLET | ORAL | Status: DC | PRN

## 2018-08-24 MED ORDER — ASPIRIN 81 MG PO CHEW
324.0000 mg | CHEWABLE_TABLET | ORAL | Status: DC
  Filled 2018-08-24: qty 4

## 2018-08-24 MED ORDER — ESCITALOPRAM OXALATE 10 MG PO TABS
10.0000 mg | ORAL_TABLET | Freq: Every day | ORAL | Status: DC
  Administered 2018-08-24 – 2018-08-25 (×2): 10 mg via ORAL
  Filled 2018-08-24 (×2): qty 1

## 2018-08-24 MED ORDER — ASPIRIN EC 81 MG PO TBEC: 81.0000 mg | DELAYED_RELEASE_TABLET | Freq: Every day | ORAL | Status: DC

## 2018-08-24 MED ORDER — NITROGLYCERIN 0.4 MG SL SUBL: 0.4000 mg | SUBLINGUAL_TABLET | SUBLINGUAL | Status: DC | PRN

## 2018-08-24 MED ORDER — PANTOPRAZOLE SODIUM 40 MG PO TBEC
40.0000 mg | DELAYED_RELEASE_TABLET | Freq: Every day | ORAL | Status: DC
  Administered 2018-08-25: 40 mg via ORAL
  Filled 2018-08-24: qty 1

## 2018-08-24 MED ORDER — ASPIRIN EC 81 MG PO TBEC
81.0000 mg | DELAYED_RELEASE_TABLET | Freq: Every day | ORAL | Status: DC
  Administered 2018-08-25: 81 mg via ORAL
  Filled 2018-08-24: qty 1

## 2018-08-24 MED ORDER — ATORVASTATIN CALCIUM 40 MG PO TABS
40.0000 mg | ORAL_TABLET | Freq: Every day | ORAL | Status: DC
  Administered 2018-08-24 – 2018-08-25 (×2): 40 mg via ORAL
  Filled 2018-08-24 (×2): qty 1

## 2018-08-24 MED ORDER — TICAGRELOR 90 MG PO TABS
90.0000 mg | ORAL_TABLET | Freq: Two times a day (BID) | ORAL | Status: DC
  Administered 2018-08-24 – 2018-08-25 (×2): 90 mg via ORAL
  Filled 2018-08-24 (×3): qty 1

## 2018-08-24 MED ORDER — ALPRAZOLAM 0.25 MG PO TABS: 0.2500 mg | ORAL_TABLET | Freq: Two times a day (BID) | ORAL | Status: DC | PRN

## 2018-08-24 MED ORDER — ASPIRIN 81 MG PO CHEW
243.0000 mg | CHEWABLE_TABLET | Freq: Once | ORAL | Status: AC
  Administered 2018-08-24: 243 mg via ORAL
  Filled 2018-08-24: qty 3

## 2018-08-24 MED ORDER — ONDANSETRON HCL 4 MG/2ML IJ SOLN: 4.0000 mg | Freq: Four times a day (QID) | INTRAMUSCULAR | Status: DC | PRN

## 2018-08-24 MED ORDER — ZOLPIDEM TARTRATE 5 MG PO TABS: 5.0000 mg | ORAL_TABLET | Freq: Every evening | ORAL | Status: DC | PRN

## 2018-08-24 MED ORDER — METOPROLOL SUCCINATE ER 25 MG PO TB24
12.5000 mg | ORAL_TABLET | Freq: Every day | ORAL | Status: DC
  Administered 2018-08-24 – 2018-08-25 (×2): 12.5 mg via ORAL
  Filled 2018-08-24 (×2): qty 1

## 2018-08-24 MED ORDER — HEPARIN SODIUM (PORCINE) 5000 UNIT/ML IJ SOLN
5000.0000 [IU] | Freq: Three times a day (TID) | INTRAMUSCULAR | Status: DC
  Administered 2018-08-24 – 2018-08-25 (×2): 5000 [IU] via SUBCUTANEOUS
  Filled 2018-08-24 (×3): qty 1

## 2018-08-24 NOTE — Progress Notes (Addendum)
   Subjective:    Patient ID: Sarah Fox, female    DOB: Jun 29, 1966, 52 y.o.   MRN: [id number]   Chief Complaint: Chest Pain (with L shoulder pain, clammy, denies SOB or nausea)   HPI Patient comes in today c/o of left shoulder pain with chest pain yesterday and this morning. She was clammy this morning when she left for work. Chest [pain and SOB has stopped but still having shoulder pain.  She denies shoulder injury. Says shoulder just all the sudden started hurting. She had a heart attack in December and had to be shocked x3. She sees DR. SMith and just saw him 2 weeks ago.  Review of Systems  Constitutional: Negative for activity change and appetite change.  HENT: Negative.   Eyes: Negative for pain.  Respiratory: Negative for shortness of breath.   Cardiovascular: Negative for chest pain, palpitations and leg swelling.  Gastrointestinal: Negative for abdominal pain.  Endocrine: Negative for polydipsia.  Genitourinary: Negative.   Skin: Negative for rash.  Neurological: Negative for dizziness, weakness and headaches.  Hematological: Does not bruise/bleed easily.  Psychiatric/Behavioral: Negative.   All other systems reviewed and are negative.      Objective:   Physical Exam  Constitutional: She appears well-developed and well-nourished. No distress.  Cardiovascular: Normal rate, regular rhythm, intact distal pulses and normal pulses.  Musculoskeletal:  FROM of left shouder without pain  Neurological: She is alert.  Skin: Skin is warm and dry.  Psychiatric: She has a normal mood and affect. Her behavior is normal.    BP 120/68 (BP Location: Left Arm, Patient Position: Sitting, Cuff Size: Normal)   Pulse 61   Temp (!) 97.1 F (36.2 C) (Oral)   Wt 143 lb (64.9 kg)   SpO2 100%   BMI 23.80 kg/m   EKG-NSR      Assessment & Plan:  Virl Son in today with chief complaint of Chest Pain (with L shoulder pain, clammy, denies SOB or nausea)   1. Chest pain in  adult would like for her to see her cardiologist ASAP- they old he rt go to er.- patient currently not in pain , will drive herself to ER. - EKG 12-Lead - Ambulatory referral to Cardiology  Mary-Margaret Daphine Deutscher, FNP

## 2018-08-24 NOTE — H&P (Addendum)
The patient has been seen in conjunction with Nada Boozer, NP-C. All aspects of care have been considered and discussed. The patient has been personally interviewed, examined, and all clinical data has been reviewed.   JD Kinnee is well-known to me from anterior myocardial infarction associated with ventricular fibrillation requiring resuscitation and culminating in proximal LAD stent December 2018.  Initial decrease in LV function resolved to low normal to mildly depressed EF of 45%.  Overall has been doing well but yesterday began having aching discomfort in the left shoulder.  The discomfort was unrelenting.  Last evening she felt some atypical discomfort in the chest.  She told her husband about it this morning and he made her come to the emergency room.  She is currently pain-free.  Exam is normal.  EKG is normal.  Initial troponin is normal.  Impression is that of atypical arm and chest pain.  My suspicion is that this is not cardiac ischemia related.  Plan observation, delta troponin and EKGs.  Cardiology Admission History and Physical:   Patient ID: Sarah Fox; MRN: [id number]; DOB: 01/09/1966   Admission date: 08/24/2018  Primary Care Provider: Junie Spencer, FNP Primary Cardiologist: Lesleigh Noe, MD  Primary Electrophysiologist:  NA  Chief Complaint:  Chest pain  Patient Profile:   Sarah Fox is a 52 y.o. female with a history of CAD, Ant. MI accompanied by V fib and successful resuscitation and acute systolic HF-with recover to HFpEF.  Now presents with chest pain.       History of Present Illness:   Ms. Gibbon with STEMI 11/2017, of ant wall, with stent to LAD.  She had lifevest but with improved EF lifevest stopped.  EF recent echo 40-45%.  Her Brilinta was to be changed to Plavix.  This has not yet occurred.    Today was at [facility] and complained of chest pain.  She was sent to ER.  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   Pain comes and goes.  She has not tried NTG.  Some pain in her chest as well.  Not severe pain but this AM was also diaphoretic without activity.  She then made appt with FP.   When walking to ER no change in discomfort.  No nausea.   EKG with SR and old ANT MI, no acute changes.  I personally reviewed. Neg preg test troponin 0.01 Na 141, K+ 4.0, Cr 0.86  Recent lipids with LDL 44 and HDL 47  Hgb 12.8, WBC 8.6 Plts 331.  Still will mild pain.  Will try NTG to see if it helps.   Hx tobacco use and FH CAD Her husband is with her today.    She has hx of Lt shoulder pain secondary to impingement. Has been through PT.     Past Medical History:  Diagnosis Date  . Acute ST elevation myocardial infarction (STEMI) of anterior wall (HCC)   . Allergy   . CAD in native artery   . Cardiac arrest with ventricular fibrillation (HCC) 10/2017   with acute MI and hypokalemia  . Coronary artery disease   . GERD (gastroesophageal reflux disease)   . Ischemic cardiomyopathy 10/2017  . Myocardial infarction acute (HCC) 10/2017  . VF (ventricular fibrillation) [facility])     Past Surgical History:  Procedure Laterality Date  . CORONARY STENT INTERVENTION N/A 11/29/2017   Procedure: CORONARY STENT INTERVENTION;  Surgeon: Lyn Records, MD;  Location: [facility] INVASIVE  CV LAB;  Service: Cardiovascular;  Laterality: N/A;  . CORONARY/GRAFT ACUTE MI REVASCULARIZATION N/A 11/29/2017   Procedure: Coronary/Graft Acute MI Revascularization;  Surgeon: Lyn Records, MD;  Location: [facility] INVASIVE CV LAB;  Service: Cardiovascular;  Laterality: N/A;  . LEFT HEART CATH AND CORONARY ANGIOGRAPHY N/A 11/29/2017   Procedure: LEFT HEART CATH AND CORONARY ANGIOGRAPHY;  Surgeon: Lyn Records, MD;  Location: [facility] INVASIVE CV LAB;  Service: Cardiovascular;  Laterality: N/A;  . LUMBAR DISC SURGERY     L5-S1     Medications Prior to Admission: Prior to Admission medications   Medication Sig Start Date End Date Taking? Authorizing  Provider  aspirin EC 81 MG tablet Take 1 tablet (81 mg total) by mouth daily. 12/02/17 12/02/18  Manson Passey, PA  atorvastatin (LIPITOR) 40 MG tablet Take 1 tablet (40 mg total) by mouth daily. 08/18/18   Lyn Records, MD  clopidogrel (PLAVIX) 75 MG tablet First dose take 4 tabs = 300 mg; beginning day 2 start on 1 tablet daily = 75 mg daily; do not start until you finish Brilinta Patient not taking: Reported on 08/24/2018 08/14/18   Lyn Records, MD  escitalopram (LEXAPRO) 10 MG tablet Take 1 tablet (10 mg total) by mouth daily. 12/08/17   Jannifer Rodney A, FNP  metoprolol succinate (TOPROL-XL) 25 MG 24 hr tablet Take 0.5 tablets (12.5 mg total) by mouth daily. 03/27/18   Lyn Records, MD  nitroGLYCERIN (NITROSTAT) 0.4 MG SL tablet Place 1 tablet (0.4 mg total) under the tongue every 5 (five) minutes as needed for chest pain. 12/02/17   Bhagat, Sharrell Ku, PA  pantoprazole (PROTONIX) 40 MG tablet Take 1 tablet (40 mg total) by mouth daily. Further refills come from PCP. 08/14/18   Lyn Records, MD     Allergies:   No Known Allergies  Social History:   Social History   Socioeconomic History  . Marital status: Married    Spouse name: Not on file  . Number of children: 1  . Years of education: Not on file  . Highest education level: Not on file  Occupational History  . 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 Financial resource strain: Not on file  . Food insecurity:    Worry: Not on file    Inability: Not on file  . Transportation needs:    Medical: Not on file    Non-medical: Not on file  Tobacco Use  . Smoking status: Former Smoker    Packs/day: 0.50    Years: 20.00    Pack years: 10.00    Last attempt to quit: 11/28/2017    Years since quitting: 0.7  . Smokeless tobacco: Never Used  Substance and Sexual Activity  . Alcohol use: Yes    Comment: 4 drinks daily  . Drug use: No  . Sexual activity: Yes    Comment: ptp states she cannot be  pregnant, but not using anything to not get prenant  Lifestyle  . Physical activity:    Days per week: Not on file    Minutes per session: Not on file  . Stress: Very much  Relationships  . Social connections:    Talks on phone: Not on file    Gets together: Not on file    Attends religious service: Not on file    Active member of club or organization: Not on file    Attends meetings of clubs or organizations: Not on  file    Relationship status: Not on file  . Intimate partner violence:    Fear of current or ex partner: Not on file    Emotionally abused: Not on file    Physically abused: Not on file    Forced sexual activity: Not on file  Other Topics Concern  . Not on file  Social History Narrative   Married with one child.    Family History:   The patient's family history includes Breast cancer (age of onset: 60) in her mother and sister; Heart disease in her brother. There is no history of Colon cancer, Esophageal cancer, Rectal cancer, or Stomach cancer.    ROS:  Please see the history of present illness.  General:no colds or fevers, no weight changes Skin:no rashes or ulcers HEENT:no blurred vision, no congestion CV:see HPI PUL:see HPI GI:no diarrhea constipation or melena, no indigestion GU:no hematuria, no dysuria MS:no joint pain, no claudication Neuro:no syncope, no lightheadedness Endo:no diabetes, no thyroid disease     Physical Exam/Data:   Vitals:   08/24/18 1023 08/24/18 1152 08/24/18 1315  BP: (!) 136/99 127/75 122/69  Pulse: 62 (!) 53 (!) 50  Resp: 20 14 17   Temp: 98 F (36.7 C)    TempSrc: Oral    SpO2: 100% 100% 100%   No intake or output data in the 24 hours ending 08/24/18 1346 There were no vitals filed for this visit. There is no height or weight on file to calculate BMI.  General:  Well nourished, well developed, in no acute distress HEENT: normal Lymph: no adenopathy Neck: no JVD Endocrine:  No thryomegaly Vascular: No carotid  bruits; pedal pulses 2+ bilaterally   Cardiac:  normal S1, S2; RRR; no murmur gallup rub or click Lungs:  clear to auscultation bilaterally, no wheezing, rhonchi or rales  Abd: soft, nontender, no hepatomegaly  Ext: no lower ext edema Musculoskeletal:  No deformities, BUE and BLE strength normal and equal Skin: warm and dry  Neuro:  Alert and oriented X 3 MAE follows commands, no focal abnormalities noted Psych:  Normal affect     Relevant CV Studies: Previous Echo 03/13/18   Study Conclusions  - Left ventricle: The cavity size was normal. Wall thickness was   normal. Systolic function was mildly to moderately reduced. The   estimated ejection fraction was in the range of 40% to 45%. Left   ventricular diastolic function parameters were normal for the   patient&'s age.  Cath PCI 11/29/17    Acute anterior myocardial infarction due to occlusion of the proximal LAD with 99% thrombus contained obstruction with TIMI grade II flow on initial angiography.  Ventricular fibrillation requiring electrical conversion x3 in succession prior to PCI.  CPR was not necessary.  Hypokalemia of 3.0 was identified.  Successful LAD PCI following aspiration thrombectomy reducing 99% stenosis with TIMI grade II antegrade flow to 0% with TIMI grade III flow.  The 3.5 x 22 mm Onyx was postdilated to 3.75 mm at high pressure.  Normal right coronary artery.  Normal left main coronary artery.  Normal circumflex coronary artery.  Acute diastolic heart failure documented by moderate elevation in LVEDP.  RECOMMENDATIONS:   Aggrastat times 18 hours  Aspirin and Brilinta times 12 months  Risk factor modification as needed: Hemoglobin A1c, lipid panel, have been ordered.  Beta-blocker and ACE inhibitor therapy should be started once hemodynamics were stable.  Discharge at 48 hours if remains stable  Laboratory Data:  Chemistry Recent Labs  Lab 08/24/18 1028  NA 141  K 4.0  CL 105  CO2 27    GLUCOSE 109*  BUN 8  CREATININE 0.86  CALCIUM 9.2  GFRNONAA >60  GFRAA >60  ANIONGAP 9    No results for input(s): PROT, ALBUMIN, AST, ALT, ALKPHOS, BILITOT in the last 168 hours. Hematology Recent Labs  Lab 08/24/18 1028  WBC 8.6  RBC 4.20  HGB 12.8  HCT 40.2  MCV 95.7  MCH 30.5  MCHC 31.8  RDW 13.1  PLT 331   Cardiac EnzymesNo results for input(s): TROPONINI in the last 168 hours.  Recent Labs  Lab 08/24/18 1044  TROPIPOC 0.01    BNPNo results for input(s): BNP, PROBNP in the last 168 hours.  DDimer No results for input(s): DDIMER in the last 168 hours.  Radiology/Studies:  Dg Chest 2 View  Result Date: 08/24/2018 CLINICAL DATA:  Left shoulder and chest pain beginning this morning with diaphoresis. EXAM: CHEST - 2 VIEW COMPARISON:  PA and lateral chest 03/11/2014. FINDINGS: The lungs are clear. Heart size is normal. No pneumothorax or pleural effusion. No acute or focal bony abnormality. IMPRESSION: Negative chest. Electronically Signed   By: Drusilla Kanner M.D.   On: 08/24/2018 11:00    Assessment and Plan:   1. Lt shoulder and chest pain.  Began yesterday.  Has come and gone, no increase with activity.  Troponin poc is normal.  EKG without acute changes.   Known hx of CAD with V fib arrest.  Low K+ at that times as well.  Now EF has improved.  Admit to obs and eval serial troponin no IV heparin unless + troponin.  Dr. Katrinka Blazing to see.     2.         CAD with STEMI with 100% stenosis to LAD.  Had stent placed and has not had any new pain since 11/2017.   3.         ICM with improved EF in March.  Adjusting meds and will recheck Echo.    4          Tobacco use, stopped in 11/2017.    5.         Hx of V. Fib arrest with acute MI.    6.         HLD  Stable.   Severity of Illness: The appropriate patient status for this patient is OBSERVATION. Observation status is judged to be reasonable and necessary in order to provide the required intensity of service to  ensure the patient's safety. The patient's presenting symptoms, physical exam findings, and initial radiographic and laboratory data in the context of their medical condition is felt to place them at decreased risk for further clinical deterioration. Furthermore, it is anticipated that the patient will be medically stable for discharge from the hospital within 2 midnights of admission. The following factors support the patient status of observation.   " The patient's presenting symptoms include lt shoulder chest discomfort and cold sweat. . " The physical exam findings include stable EKG and CXR. " The initial radiographic and laboratory data are stable. But with hx of cardiac arrest with V fib in Dec. And ant wall MI will proceed to keep overnight..     For questions or updates, please contact [facility] Please consult [email or web address] for contact info under Cardiology/STEMI.    Signed, Nada Boozer, NP  08/24/2018 1:46 PM

## 2018-08-24 NOTE — ED Notes (Signed)
Pt has no active chest pain

## 2018-08-24 NOTE — ED Provider Notes (Signed)
[facility] Emergency Department Provider Note MRN:  [id number]  Arrival date & time: 08/24/18     Chief Complaint   Chest Pain   History of Present Illness   Sarah Fox is a 52 y.o. year-old female with a history of CAD, MI, stent placement presenting to the ED with chief complaint of chest pain.  The pain is located in the center of the chest, left side of the chest, and left shoulder.  The pain began yesterday suddenly while at work, patient explains that she was not exerting herself.  Described as a pressure-like pain, not made worse with movement of the neck or arm.  Pain is been constant since that time, this morning associated with diaphoresis.  Denies nausea, no vomiting, no shortness of breath.  Patient explains that this pain is similar in location to her prior heart attack.  Denies fever, cough, no abdominal pain, endorsing full compliance with her current medical management of her recent heart attack.  Review of Systems  A complete 10 system review of systems was obtained and all systems are negative except as noted in the HPI and PMH.   Patient's Health History    Past Medical History:  Diagnosis Date  . Acute ST elevation myocardial infarction (STEMI) of anterior wall (HCC)   . Allergy   . CAD in native artery   . Cardiac arrest with ventricular fibrillation (HCC) 10/2017   with acute MI and hypokalemia  . Coronary artery disease   . GERD (gastroesophageal reflux disease)   . Ischemic cardiomyopathy 10/2017  . Myocardial infarction acute (HCC) 10/2017  . VF (ventricular fibrillation) [facility])     Past Surgical History:  Procedure Laterality Date  . CORONARY STENT INTERVENTION N/A 11/29/2017   Procedure: CORONARY STENT INTERVENTION;  Surgeon: Lyn Records, MD;  Location: [facility] INVASIVE CV LAB;  Service: Cardiovascular;  Laterality: N/A;  . CORONARY/GRAFT ACUTE MI REVASCULARIZATION N/A 11/29/2017   Procedure: Coronary/Graft Acute MI  Revascularization;  Surgeon: Lyn Records, MD;  Location: [facility] INVASIVE CV LAB;  Service: Cardiovascular;  Laterality: N/A;  . LEFT HEART CATH AND CORONARY ANGIOGRAPHY N/A 11/29/2017   Procedure: LEFT HEART CATH AND CORONARY ANGIOGRAPHY;  Surgeon: Lyn Records, MD;  Location: [facility] INVASIVE CV LAB;  Service: Cardiovascular;  Laterality: N/A;  . LUMBAR DISC SURGERY     L5-S1    Family History  Problem Relation Age of Onset  . Breast cancer Mother 81  . Breast cancer Sister 24  . Heart disease Brother   . Colon cancer Neg Hx   . Esophageal cancer Neg Hx   . Rectal cancer Neg Hx   . Stomach cancer Neg Hx     Social History   Socioeconomic History  . Marital status: Married    Spouse name: Not on file  . Number of children: 1  . Years of education: Not on file  . Highest education level: Not on file  Occupational History  . 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 Financial resource strain: Not on file  . Food insecurity:    Worry: Not on file    Inability: Not on file  . Transportation needs:    Medical: Not on file    Non-medical: Not on file  Tobacco Use  . Smoking status: Former Smoker    Packs/day: 0.50    Years: 20.00    Pack years: 10.00    Last attempt to  quit: 11/28/2017    Years since quitting: 0.7  . Smokeless tobacco: Never Used  Substance and Sexual Activity  . Alcohol use: Yes    Comment: 4 drinks daily  . Drug use: No  . Sexual activity: Yes    Comment: ptp states she cannot be pregnant, but not using anything to not get prenant  Lifestyle  . Physical activity:    Days per week: Not on file    Minutes per session: Not on file  . Stress: Very much  Relationships  . Social connections:    Talks on phone: Not on file    Gets together: Not on file    Attends religious service: Not on file    Active member of club or organization: Not on file    Attends meetings of clubs or organizations: Not on file    Relationship status:  Not on file  . Intimate partner violence:    Fear of current or ex partner: Not on file    Emotionally abused: Not on file    Physically abused: Not on file    Forced sexual activity: Not on file  Other Topics Concern  . Not on file  Social History Narrative   Married with one child.     Physical Exam  Vital Signs and Nursing Notes reviewed Vitals:   08/24/18 1315 08/24/18 1400  BP: 122/69 119/76  Pulse: (!) 50 (!) 55  Resp: 17 17  Temp:    SpO2: 100% 100%    CONSTITUTIONAL: Well-appearing, NAD NEURO:  Alert and oriented x 3, no focal deficits EYES:  eyes equal and reactive ENT/NECK:  no LAD, no JVD CARDIO: Regular rate, well-perfused, normal S1 and S2 PULM:  CTAB no wheezing or rhonchi GI/GU:  normal bowel sounds, non-distended, non-tender MSK/SPINE:  No gross deformities, no edema SKIN:  no rash, atraumatic PSYCH:  Appropriate speech and behavior  Diagnostic and Interventional Summary    EKG Interpretation  Date/Time:  Thursday August 24 2018 10:20:47 EDT Ventricular Rate:  61 PR Interval:  146 QRS Duration: 82 QT Interval:  410 QTC Calculation: 412 R Axis:   78 Text Interpretation:  Normal sinus rhythm Cannot rule out Anterior infarct , age undetermined Abnormal ECG Confirmed by Kennis Carina [phone]) on 08/24/2018 11:49:28 AM      Labs Reviewed  BASIC METABOLIC PANEL - Abnormal; Notable for the following components:      Result Value   Glucose, Bld 109 (*)    All other components within normal limits  CBC  I-STAT TROPONIN, ED  I-STAT BETA HCG BLOOD, ED ([facility], [facility], [facility] ONLY)    DG Chest 2 View  Final Result      Medications  nitroGLYCERIN (NITROSTAT) SL tablet 0.4 mg (has no administration in time range)  aspirin chewable tablet 243 mg (243 mg Oral Given 08/24/18 1406)     Procedures Critical Care  ED Course and Medical Decision Making  I have reviewed the triage vital signs and the nursing notes.  Pertinent labs & imaging results that were  available during my care of the patient were reviewed by me and considered in my medical decision making (see below for details). Clinical Course as of Aug 25 1419  Thu Aug 24, 2018  1246 Cardiology consulted for this 52 year old female with recent 99% LAD occlusion STEMI presentation 6 months ago, few episodes of V. fib, stent placed, has been recovering well.  Chest pain since yesterday with associated diaphoresis.  EKG unremarkable, first troponin  negative here.  Examined consistent with MSK pain, full range of motion with no reproducible pain.  Pending cardiology evaluation, anticipating admission.   [MB]    Clinical Course User Index [MB] Sabas Sous, MD    Admitted to cardiology service for further care and evaluation.  Elmer Sow. Pilar Plate, MD [facility] Emergency Medicine [facility] [email or web address]  Final Clinical Impressions(s) / ED Diagnoses     ICD-10-CM   1. Chest pain, unspecified type R07.9     ED Discharge Orders    None         Sabas Sous, MD 08/24/18 1420

## 2018-08-24 NOTE — ED Triage Notes (Signed)
Pt in c/o left shoulder pain and chest pain that started this morning, also episode of diaphoresis, no distress noted

## 2018-08-24 NOTE — ED Notes (Signed)
ED Provider at bedside. 

## 2018-08-24 NOTE — Patient Instructions (Signed)
Chest Wall Pain °Chest wall pain is pain in or around the bones and muscles of your chest. Sometimes, an injury causes this pain. Sometimes, the cause may not be known. This pain may take several weeks or longer to get better. °Follow these instructions at home: °Pay attention to any changes in your symptoms. Take these actions to help with your pain: °· Rest as told by your doctor. °· Avoid activities that cause pain. Try not to use your chest, belly (abdominal), or side muscles to lift heavy things. °· If directed, apply ice to the painful area: °? Put ice in a plastic bag. °? Place a towel between your skin and the bag. °? Leave the ice on for 20 minutes, 2-3 times per day. °· Take over-the-counter and prescription medicines only as told by your doctor. °· Do not use tobacco products, including cigarettes, chewing tobacco, and e-cigarettes. If you need help quitting, ask your doctor. °· Keep all follow-up visits as told by your doctor. This is important. ° °Contact a doctor if: °· You have a fever. °· Your chest pain gets worse. °· You have new symptoms. °Get help right away if: °· You feel sick to your stomach (nauseous) or you throw up (vomit). °· You feel sweaty or light-headed. °· You have a cough with phlegm (sputum) or you cough up blood. °· You are short of breath. °This information is not intended to replace advice given to you by your health care provider. Make sure you discuss any questions you have with your health care provider. °Document Released: 05/31/2008 Document Revised: 05/20/2016 Document Reviewed: 03/10/2015 °Elsevier Interactive Patient Education © 2018 [employer] Inc. ° °

## 2018-08-25 ENCOUNTER — Telehealth: Payer: Self-pay | Admitting: *Deleted

## 2018-08-25 DIAGNOSIS — R079 Chest pain, unspecified: Secondary | ICD-10-CM | POA: Diagnosis not present

## 2018-08-25 DIAGNOSIS — E039 Hypothyroidism, unspecified: Secondary | ICD-10-CM | POA: Diagnosis not present

## 2018-08-25 DIAGNOSIS — I2510 Atherosclerotic heart disease of native coronary artery without angina pectoris: Secondary | ICD-10-CM | POA: Diagnosis not present

## 2018-08-25 DIAGNOSIS — R0789 Other chest pain: Secondary | ICD-10-CM | POA: Diagnosis not present

## 2018-08-25 DIAGNOSIS — I252 Old myocardial infarction: Secondary | ICD-10-CM | POA: Diagnosis not present

## 2018-08-25 DIAGNOSIS — I4901 Ventricular fibrillation: Secondary | ICD-10-CM | POA: Diagnosis not present

## 2018-08-25 LAB — CBC
HCT: 38.8 % (ref 36.0–46.0)
Hemoglobin: 12.5 g/dL (ref 12.0–15.0)
MCH: 30.6 pg (ref 26.0–34.0)
MCHC: 32.2 g/dL (ref 30.0–36.0)
MCV: 95.1 fL (ref 78.0–100.0)
PLATELETS: 303 10*3/uL (ref 150–400)
RBC: 4.08 MIL/uL (ref 3.87–5.11)
RDW: 13.1 % (ref 11.5–15.5)
WBC: 8.9 10*3/uL (ref 4.0–10.5)

## 2018-08-25 LAB — BASIC METABOLIC PANEL
Anion gap: 10 (ref 5–15)
BUN: 10 mg/dL (ref 6–20)
CALCIUM: 8.8 mg/dL — AB (ref 8.9–10.3)
CO2: 25 mmol/L (ref 22–32)
CREATININE: 0.87 mg/dL (ref 0.44–1.00)
Chloride: 105 mmol/L (ref 98–111)
GFR calc non Af Amer: >60 mL/min (ref >60)
Glucose, Bld: 96 mg/dL (ref 70–99)
Potassium: 3.9 mmol/L (ref 3.5–5.1)
SODIUM: 140 mmol/L (ref 135–145)

## 2018-08-25 LAB — TROPONIN I: Troponin I: <0.03 ng/mL (ref <0.03)

## 2018-08-25 NOTE — Telephone Encounter (Signed)
Spoke with pt and made her aware and results and recommendations.  Pt verbalized understanding.

## 2018-08-25 NOTE — Telephone Encounter (Signed)
Left message to call back  

## 2018-08-25 NOTE — Telephone Encounter (Signed)
Neysa Bonito is out of the office today. Advised patient that a hospital follow up appointment to go over results, symptoms, and treatment would be a good idea.

## 2018-08-25 NOTE — Discharge Summary (Addendum)
The patient has been seen in conjunction with Boyce Medici, PA-C. All aspects of care have been considered and discussed. The patient has been personally interviewed, examined, and all clinical data has been reviewed.   Current presenting symptom is not felt to represent myocardial ischemia or injury.  Shoulder discomfort is likely musculoskeletal.  No specific cardiac work-up is felt to be necessary.  Coincidentally, found to be severely hypothyroid.  TSH is 18.1.  Will refer to her primary physician for further management.  Discharge Summary    Patient ID: Sarah Fox,  MRN: [id number], DOB/AGE: 1966-11-06 52 y.o.  Admit date: 08/24/2018 Discharge date: 08/25/2018  Primary Care Provider: Jannifer Rodney A Primary Cardiologist: Sarah Noe, MD  Discharge Diagnoses    Active Problems:   Chest pain at rest   Allergies No Known Allergies  Diagnostic Studies/Procedures    Previous Echo 03/13/18   Study Conclusions  - Left ventricle: The cavity size was normal. Wall thickness was normal. Systolic function was mildly to moderately reduced. The estimated ejection fraction was in the range of 40% to 45%. Left ventricular diastolic function parameters were normal for the patient&'s age.  Cath PCI 11/29/17    Acute anterior myocardial infarction due to occlusion of the proximal LAD with 99% thrombus contained obstruction with TIMI grade II flow on initial angiography.  Ventricular fibrillation requiring electrical conversion x3 in succession prior to PCI. CPR was not necessary. Hypokalemia of 3.0 was identified.  Successful LAD PCI following aspiration thrombectomy reducing 99% stenosis with TIMI grade II antegrade flow to 0% with TIMI grade III flow. The 3.5 x 22 mm Onyx was postdilated to 3.75 mm at high pressure.  Normal right coronary artery.  Normal left main coronary artery.  Normal circumflex coronary artery.  Acute diastolic heart  failure documented by moderate elevation in LVEDP.  RECOMMENDATIONS:   Aggrastat times 18 hours  Aspirin and Brilinta times 12 months  Risk factor modification as needed: Hemoglobin A1c, lipid panel, have been ordered.  Beta-blocker and ACE inhibitor therapy should be started once hemodynamics were stable.  Discharge at 48 hours if remains stable   History of Present Illness     Pt is a 52 y/o female with a history of CAD and systolic HF, who presented to [facility] ED on 08/25/18 with CC of chest pain.   To summarize her history, she was admitted in Dec. 2018 for acute anterior myocardial infarction associated with ventricular fibrillation requiring resuscitation and culminating in proximal LAD stent. She had an initial decrease in LV function that resolved to low normal to mildly depressed EF of 45%. She is followed by Dr. Katrinka Fox.   On 08/24/18, she presented to the ED with complaint of aching discomfort in the left shoulder. The discomfort was unrelenting. The evening prior to that, she felt some atypical discomfort in the chest. Given her cardiac history, she decided to come in for evaluation.   She was examined by Dr. Katrinka Fox in the ED. On examination, she was completely CP free with normal exam. EKG was normal and initial troponin was normal. Her CP was felt to be atypical and likely non cardiac. However, given her history she was admitted for overnight observation to telemetry unit with serial troponin's checks and EKGs.  Hospital Course     Pt was monitored overnight and had no recurrent CP. Her cardiac enzymes remained negative x 3. No events on telemetry. Her Chest and shoulder pain was felt to be  noncardiac and no further w/u was recommended. Dr. Katrinka Fox recommended continuation of her cardiac medications. No changes were made. Dr. Katrinka Fox felt that she was stable for discharge home. Post hospital f/u will be arranged with an APP.   Other noncardiac concerns: during this hospitalization,  thyroid studies were ordered including TSH and Free T4. TSH was high at 18 and Free T4 was low at 0.72. Pt will follow-up with her PCP for further w/u and management.    Consultants: none   Discharge Vitals Blood pressure (!) 110/58, pulse (!) 59, temperature 98 F (36.7 C), temperature source Oral, resp. rate 18, height 5\' 5"  (1.651 m), weight 64.6 kg, SpO2 97 %.  Filed Weights   08/24/18 1520 08/25/18 0536  Weight: 64.4 kg 64.6 kg    Labs & Radiologic Studies    CBC Recent Labs    08/24/18 1541 08/25/18 0422  WBC 9.2 8.9  HGB 13.1 12.5  HCT 40.6 38.8  MCV 95.3 95.1  PLT 346 303   Basic Metabolic Panel Recent Labs    16/10/96 1028 08/24/18 1541 08/25/18 0422  NA 141  --  140  K 4.0  --  3.9  CL 105  --  105  CO2 27  --  25  GLUCOSE 109*  --  96  BUN 8  --  10  CREATININE 0.86 0.81 0.87  CALCIUM 9.2  --  8.8*   Liver Function Tests No results for input(s): AST, ALT, ALKPHOS, BILITOT, PROT, ALBUMIN in the last 72 hours. No results for input(s): LIPASE, AMYLASE in the last 72 hours. Cardiac Enzymes Recent Labs    08/24/18 1541 08/24/18 2210 08/25/18 0422  TROPONINI <0.03 <0.03 <0.03   BNP Invalid input(s): POCBNP D-Dimer No results for input(s): DDIMER in the last 72 hours. Hemoglobin A1C Recent Labs    08/24/18 1541  HGBA1C 5.2   Fasting Lipid Panel No results for input(s): CHOL, HDL, LDLCALC, TRIG, CHOLHDL, LDLDIRECT in the last 72 hours. Thyroid Function Tests Recent Labs    08/24/18 1541  TSH 18.118*   _____________  Dg Chest 2 View  Result Date: 08/24/2018 CLINICAL DATA:  Left shoulder and chest pain beginning this morning with diaphoresis. EXAM: CHEST - 2 VIEW COMPARISON:  PA and lateral chest 03/11/2014. FINDINGS: The lungs are clear. Heart size is normal. No pneumothorax or pleural effusion. No acute or focal bony abnormality. IMPRESSION: Negative chest. Electronically Signed   By: Drusilla Kanner M.D.   On: 08/24/2018 11:00    Disposition   Pt is being discharged home today in good condition.  Follow-up Plans & Appointments    Follow-up Information    Sarah Spencer, FNP.   Specialty:  Family Medicine Why:  Please call office for appointment Contact information: [address] [place] [address] [phone]          Discharge Instructions    Diet - low sodium heart healthy   Complete by:  As directed    Increase activity slowly   Complete by:  As directed       Discharge Medications   Allergies as of 08/25/2018   No Known Allergies     Medication List    TAKE these medications   aspirin EC 81 MG tablet Take 1 tablet (81 mg total) by mouth daily.   atorvastatin 40 MG tablet Commonly known as:  LIPITOR Take 1 tablet (40 mg total) by mouth daily.   clopidogrel 75 MG tablet Commonly known as:  PLAVIX  First dose take 4 tabs = 300 mg; beginning day 2 start on 1 tablet daily = 75 mg daily; do not start until you finish Brilinta   escitalopram 10 MG tablet Commonly known as:  LEXAPRO Take 1 tablet (10 mg total) by mouth daily.   metoprolol succinate 25 MG 24 hr tablet Commonly known as:  TOPROL-XL Take 0.5 tablets (12.5 mg total) by mouth daily.   nitroGLYCERIN 0.4 MG SL tablet Commonly known as:  NITROSTAT Place 1 tablet (0.4 mg total) under the tongue every 5 (five) minutes as needed for chest pain.   pantoprazole 40 MG tablet Commonly known as:  PROTONIX Take 1 tablet (40 mg total) by mouth daily. Further refills come from PCP.        Acute coronary syndrome (MI, NSTEMI, STEMI, etc) this admission?: No.    Outstanding Labs/Studies   F/u w/ PCP regarding abnormal thyroid function test.    Duration of Discharge Encounter   Greater than 30 minutes including physician time.  Signed, Robbie Lis, PA-C 08/25/2018, 12:33 PM

## 2018-08-25 NOTE — Telephone Encounter (Signed)
-----   Message from Allayne Butcher, [place] sent at 08/25/2018 12:44 PM EDT ----- Victorino Dike, can you please call pt and update her on her hospital labs. She came in for overnight observation for atypical CP and ruled out. Dr. Katrinka Blazing felt that she was ok for discharge, but her thyroid function test were slightly abnormal. We did not realize this prior to discharge. Therefore, she will need to f/u with her PCP for hypothyroidism. I routed her d/c summary to her PCP for notification. Thanks.

## 2018-08-25 NOTE — Telephone Encounter (Signed)
Patient was hospitalized yesterday and is being discharged today. Cardiologist advised once she was discharged that her TSH was elevated and that he would send the results to Jannifer Rodney, FNP for management.

## 2018-08-29 ENCOUNTER — Ambulatory Visit: Payer: Federal, State, Local not specified - PPO | Admitting: Family

## 2018-08-29 DIAGNOSIS — E039 Hypothyroidism, unspecified: Secondary | ICD-10-CM | POA: Insufficient documentation

## 2018-08-29 MED ORDER — LEVOTHYROXINE SODIUM 75 MCG PO TABS: 75.0000 ug | ORAL_TABLET | Freq: Every day | ORAL | 3 refills | Status: DC

## 2018-08-29 MED ORDER — LEVOTHYROXINE SODIUM 50 MCG PO TABS: 50.0000 ug | ORAL_TABLET | Freq: Every day | ORAL | 3 refills | Status: DC

## 2018-08-29 NOTE — Telephone Encounter (Signed)
Levothyroxine 75 mcg Prescription sent to pharmacy

## 2018-08-29 NOTE — Telephone Encounter (Signed)
Patient aware and verbalizes understanding- per [facility] patient is to make follow up in 2 month - apt made

## 2018-10-30 ENCOUNTER — Ambulatory Visit: Payer: Federal, State, Local not specified - PPO | Admitting: Family

## 2018-10-30 ENCOUNTER — Encounter: Payer: Self-pay | Admitting: Family

## 2018-10-30 VITALS — BP 125/76 | HR 59 | Temp 97.3°F | Ht 65.0 in | Wt 147.6 lb

## 2018-10-30 DIAGNOSIS — E039 Hypothyroidism, unspecified: Secondary | ICD-10-CM

## 2018-10-30 DIAGNOSIS — F411 Generalized anxiety disorder: Secondary | ICD-10-CM | POA: Insufficient documentation

## 2018-10-30 NOTE — Progress Notes (Signed)
   Subjective:    Patient ID: Sarah Fox, female    DOB: 11-Mar-1966, 52 y.o.   MRN: [id number]  Chief Complaint  Patient presents with  . Medical Management of Chronic Issues    two month recheck on thyroid   PT presents to the office today to recheck thyroid. States her fatigue has greatly improved since starting. She states she no longer needs a nap by the afternoon.   Complaining of pain in bilateral lateral feet. She states this had improved about a week after starting the levothyroxine. She states it returned about a week or two ago.   She states her father passed away and has been dealing with that. She states the lexpro has greatly helped.  Thyroid Problem  Presents for follow-up visit. Symptoms include depressed mood and fatigue. Patient reports no dry skin, hoarse voice or visual change. The symptoms have been stable.      Review of Systems  Constitutional: Positive for fatigue.  HENT: Negative for hoarse voice.   All other systems reviewed and are negative.      Objective:   Physical Exam  Constitutional: She is oriented to person, place, and time. She appears well-developed and well-nourished. No distress.  HENT:  Head: Normocephalic and atraumatic.  Right Ear: External ear normal.  Left Ear: External ear normal.  Mouth/Throat: Oropharynx is clear and moist.  Eyes: Pupils are equal, round, and reactive to light.  Neck: Normal range of motion. Neck supple. No thyromegaly present.  Cardiovascular: Normal rate, regular rhythm, normal heart sounds and intact distal pulses.  No murmur heard. Pulmonary/Chest: Effort normal and breath sounds normal. No respiratory distress. She has no wheezes.  Abdominal: Soft. Bowel sounds are normal. She exhibits no distension. There is no tenderness.  Musculoskeletal: Normal range of motion. She exhibits no edema or tenderness.  Neurological: She is alert and oriented to person, place, and time. She has normal reflexes. No cranial  nerve deficit.  Skin: Skin is warm and dry.  Psychiatric: She has a normal mood and affect. Her behavior is normal. Judgment and thought content normal.  Vitals reviewed.     BP 125/76   Pulse (!) 59   Temp (!) 97.3 F (36.3 C) (Oral)   Ht 5\' 5"  (1.651 m)   Wt 147 lb 9.6 oz (67 kg)   BMI 24.56 kg/m      Assessment & Plan:  Sarah Fox comes in today with chief complaint of Medical Management of Chronic Issues (two month recheck on thyroid)   Diagnosis and orders addressed:  1. Hypothyroidism, unspecified type Continue current dose until labs - TSH  2. GAD (generalized anxiety disorder) Continue lexapro Stress management    Labs pending Health Maintenance reviewed Diet and exercise encouraged  Follow up plan: 6 months    Jannifer Rodney, FNP

## 2018-10-30 NOTE — Patient Instructions (Signed)
Hypothyroidism Hypothyroidism is a disorder of the thyroid. The thyroid is a large gland that is located in the lower front of the neck. The thyroid releases hormones that control how the body works. With hypothyroidism, the thyroid does not make enough of these hormones. What are the causes? Causes of hypothyroidism may include:  Viral infections.  Pregnancy.  Your own defense system (immune system) attacking your thyroid.  Certain medicines.  Birth defects.  Past radiation treatments to your head or neck.  Past treatment with radioactive iodine.  Past surgical removal of part or all of your thyroid.  Problems with the gland that is located in the center of your brain (pituitary).  What are the signs or symptoms? Signs and symptoms of hypothyroidism may include:  Feeling as though you have no energy (lethargy).  Inability to tolerate cold.  Weight gain that is not explained by a change in diet or exercise habits.  Dry skin.  Coarse hair.  Menstrual irregularity.  Slowing of thought processes.  Constipation.  Sadness or depression.  How is this diagnosed? Your health care provider may diagnose hypothyroidism with blood tests and ultrasound tests. How is this treated? Hypothyroidism is treated with medicine that replaces the hormones that your body does not make. After you begin treatment, it may take several weeks for symptoms to go away. Follow these instructions at home:  Take medicines only as directed by your health care provider.  If you start taking any new medicines, tell your health care provider.  Keep all follow-up visits as directed by your health care provider. This is important. As your condition improves, your dosage needs may change. You will need to have blood tests regularly so that your health care provider can watch your condition. Contact a health care provider if:  Your symptoms do not get better with treatment.  You are taking thyroid  replacement medicine and: ? You sweat excessively. ? You have tremors. ? You feel anxious. ? You lose weight rapidly. ? You cannot tolerate heat. ? You have emotional swings. ? You have diarrhea. ? You feel weak. Get help right away if:  You develop chest pain.  You develop an irregular heartbeat.  You develop a rapid heartbeat. This information is not intended to replace advice given to you by your health care provider. Make sure you discuss any questions you have with your health care provider. Document Released: 12/13/2005 Document Revised: 05/20/2016 Document Reviewed: 04/30/2014 Elsevier Interactive Patient Education  2018 Elsevier Inc.  

## 2018-10-31 LAB — TSH: TSH: 3.39 u[IU]/mL (ref 0.450–4.500)

## 2018-11-16 ENCOUNTER — Encounter: Payer: Self-pay | Admitting: Family

## 2018-11-16 ENCOUNTER — Ambulatory Visit (INDEPENDENT_AMBULATORY_CARE_PROVIDER_SITE_OTHER): Payer: Federal, State, Local not specified - PPO | Admitting: Family

## 2018-11-16 ENCOUNTER — Ambulatory Visit (INDEPENDENT_AMBULATORY_CARE_PROVIDER_SITE_OTHER): Payer: Federal, State, Local not specified - PPO

## 2018-11-16 ENCOUNTER — Other Ambulatory Visit: Payer: Self-pay | Admitting: Family

## 2018-11-16 VITALS — BP 128/76 | HR 71 | Temp 97.4°F | Ht 65.0 in | Wt 147.0 lb

## 2018-11-16 DIAGNOSIS — M25532 Pain in left wrist: Secondary | ICD-10-CM | POA: Diagnosis not present

## 2018-11-16 DIAGNOSIS — S63502A Unspecified sprain of left wrist, initial encounter: Secondary | ICD-10-CM | POA: Diagnosis not present

## 2018-11-16 MED ORDER — NAPROXEN 500 MG PO TABS: 500.0000 mg | ORAL_TABLET | Freq: Two times a day (BID) | ORAL | 1 refills | Status: DC

## 2018-11-16 NOTE — Progress Notes (Signed)
   Subjective:    Patient ID: Sarah Fox, female    DOB: 14-Aug-1966, 52 y.o.   MRN: [id number]  Chief Complaint  Patient presents with  . Wrist Pain    Left. Patient states that she moved a 5lb of mail yesterday and since then has been having wrist pain   Pt presents to the office today with left wrist pain that started yesterday after picking up a basket of mail that was approx 5 lbs.  Wrist Pain   The pain is present in the left wrist. This is a new problem. The current episode started yesterday. There has been a history of trauma. The problem occurs constantly. The problem has been unchanged. The quality of the pain is described as aching. The pain is at a severity of 5/10. The pain is mild. Associated symptoms include joint swelling, a limited range of motion and stiffness. Pertinent negatives include no inability to bear weight. The symptoms are aggravated by activity. She has tried rest and NSAIDS for the symptoms. The treatment provided mild relief.      Review of Systems  Musculoskeletal: Positive for stiffness.  All other systems reviewed and are negative.      Objective:   Physical Exam  Constitutional: She is oriented to person, place, and time. She appears well-developed and well-nourished. No distress.  HENT:  Head: Normocephalic and atraumatic.  Right Ear: External ear normal.  Left Ear: External ear normal.  Mouth/Throat: Oropharynx is clear and moist.  Eyes: Pupils are equal, round, and reactive to light.  Neck: Normal range of motion. Neck supple. No thyromegaly present.  Cardiovascular: Normal rate, regular rhythm, normal heart sounds and intact distal pulses.  No murmur heard. Pulmonary/Chest: Effort normal and breath sounds normal. No respiratory distress. She has no wheezes.  Abdominal: Soft. Bowel sounds are normal. She exhibits no distension. There is no tenderness.  Musculoskeletal: She exhibits no edema or tenderness.  Pain in left wrist with flexion,  extension, and rotation  Neurological: She is alert and oriented to person, place, and time. She has normal reflexes. No cranial nerve deficit.  Skin: Skin is warm and dry.  Psychiatric: She has a normal mood and affect. Her behavior is normal. Judgment and thought content normal.  Vitals reviewed.    BP 128/76   Pulse 71   Temp (!) 97.4 F (36.3 C) (Oral)   Ht 5\' 5"  (1.651 m)   Wt 147 lb (66.7 kg)   BMI 24.46 kg/m      Assessment & Plan:  Naoma Digirolamo comes in today with chief complaint of Wrist Pain (Left. Patient states that she moved a 5lb of mail yesterday and since then has been having wrist pain)   Diagnosis and orders addressed:  1. Left wrist pain - DG Wrist Complete Left; Future - naproxen (NAPROSYN) 500 MG tablet; Take 1 tablet (500 mg total) by mouth 2 (two) times daily with a meal.  Dispense: 60 tablet; Refill: 1  2. Sprain of left wrist, initial encounter Rest Ice  Wrist splint applied Start naproxen BID RTO if symptoms worsen or do not improve  - naproxen (NAPROSYN) 500 MG tablet; Take 1 tablet (500 mg total) by mouth 2 (two) times daily with a meal.  Dispense: 60 tablet; Refill: 1    Jannifer Rodney, FNP

## 2018-11-16 NOTE — Patient Instructions (Signed)
Wrist Pain, Adult  There are many things that can cause wrist pain. Some common causes include:   An injury to the wrist area, such as a sprain, strain, or fracture.   Overuse of the joint.   A condition that causes increased pressure on a nerve in the wrist (carpal tunnel syndrome).   Wear and tear of the joints that occurs with aging (osteoarthritis).   A variety of other types of arthritis.    Sometimes, the cause of wrist pain is not known. Often, the pain goes away when you follow instructions from your health care provider for relieving pain at home, such as resting or icing the wrist. If your wrist pain continues, it is important to tell your health care provider.  Follow these instructions at home:   Rest the wrist area for at least 48 hours or as long as told by your health care provider.   If a splint or elastic bandage has been applied, use it as told by your health care provider.  ? Remove the splint or bandage only as told by your health care provider.  ? Loosen the splint or bandage if your fingers tingle, become numb, or turn cold or blue.   If directed, apply ice to the injured area.  ? If you have a removable splint or elastic bandage, remove it as told by your health care provider.  ? Put ice in a plastic bag.  ? Place a towel between your skin and the bag or between your splint or bandage and the bag.  ? Leave the ice on for 20 minutes, 2-3 times a day.   Keep your arm raised (elevated) above the level of your heart while you are sitting or lying down.   Take over-the-counter and prescription medicines only as told by your health care provider.   Keep all follow-up visits as told by your health care provider. This is important.  Contact a health care provider if:   You have a sudden sharp pain in the wrist, hand, or arm that is different or new.   The swelling or bruising on your wrist or hand gets worse.   Your skin becomes red, gets a rash, or has open sores.   Your pain does not  get better or it gets worse.  Get help right away if:   You lose feeling in your fingers or hand.   Your fingers turn white, very red, or cold and blue.   You cannot move your fingers.   You have a fever or chills.  This information is not intended to replace advice given to you by your health care provider. Make sure you discuss any questions you have with your health care provider.  Document Released: 09/22/2005 Document Revised: 07/08/2016 Document Reviewed: 07/01/2016  Elsevier Interactive Patient Education  2018 Elsevier Inc.

## 2018-11-21 ENCOUNTER — Other Ambulatory Visit: Payer: Self-pay | Admitting: Interventional Cardiology

## 2018-11-21 ENCOUNTER — Other Ambulatory Visit: Payer: Self-pay | Admitting: Family

## 2018-11-21 DIAGNOSIS — F419 Anxiety disorder, unspecified: Secondary | ICD-10-CM

## 2018-11-21 NOTE — Telephone Encounter (Signed)
We switched pt to Protonix because we started Plavix and this interfered with her Prilosec.  Since we switched medication, ok to fill.  Thanks!

## 2018-11-21 NOTE — Telephone Encounter (Signed)
Pt pharmacy requesting refill for Protonix 40 mg, which was filled by Dr. Katrinka Blazing on 08/14/18. The Pt sig says for her to see PCP for future refills, but not sure who put sig in. Please address. Thank you.

## 2018-11-22 ENCOUNTER — Other Ambulatory Visit: Payer: Self-pay | Admitting: Physician Assistant

## 2018-12-04 ENCOUNTER — Ambulatory Visit (HOSPITAL_COMMUNITY): Payer: Federal, State, Local not specified - PPO | Attending: Cardiology

## 2018-12-04 ENCOUNTER — Other Ambulatory Visit: Payer: Self-pay

## 2018-12-04 DIAGNOSIS — I5042 Chronic combined systolic (congestive) and diastolic (congestive) heart failure: Secondary | ICD-10-CM | POA: Insufficient documentation

## 2018-12-27 NOTE — Progress Notes (Signed)
Cardiology Office Note:    Date:  12/29/2018   ID:  Sarah, Fox [birth year], MRN [id number]  PCP:  Junie Spencer, FNP  Cardiologist:  Lesleigh Noe, MD   Referring MD: Junie Spencer, FNP   Chief Complaint  Patient presents with  . Coronary Artery Disease  . Congestive Heart Failure    Post infarct decreased systolic function    History of Present Illness:    Sarah Fox is a 53 y.o. female with a hx of a history of CAD, Ant. MI accompanied by V fib and successful resuscitation and acute systolic HF-with recover to HFpEF.Recent chest pain 07/2018 requiring hospital obs.  She is no longer smoking.  She is dealing with the recent death of her father in the fall at 71 years of age due to a large stroke.  She is concerned that when she does upper body isometric activity, she notices dyspnea.  She has no issues with dyspnea when doing stationary biking.  Both she and her husband go to the gym several times per week together.  She denies angina, orthopnea, PND, palpitations, and syncope.  No bleeding complications on Plavix.  She was admitted to the hospital in August with chest pain but no objective evidence of ischemia was identified and she ruled out for MI.  No recurrence of similar symptoms since that time.   Past Medical History:  Diagnosis Date  . Acute ST elevation myocardial infarction (STEMI) of anterior wall (HCC)   . Allergy   . CAD in native artery   . Cardiac arrest with ventricular fibrillation (HCC) 10/2017   with acute MI and hypokalemia  . Coronary artery disease   . GERD (gastroesophageal reflux disease)   . Ischemic cardiomyopathy 10/2017  . Myocardial infarction acute (HCC) 10/2017  . VF (ventricular fibrillation) [facility])     Past Surgical History:  Procedure Laterality Date  . CORONARY STENT INTERVENTION N/A 11/29/2017   Procedure: CORONARY STENT INTERVENTION;  Surgeon: Lyn Records, MD;  Location: [facility] INVASIVE CV LAB;  Service:  Cardiovascular;  Laterality: N/A;  . CORONARY/GRAFT ACUTE MI REVASCULARIZATION N/A 11/29/2017   Procedure: Coronary/Graft Acute MI Revascularization;  Surgeon: Lyn Records, MD;  Location: [facility] INVASIVE CV LAB;  Service: Cardiovascular;  Laterality: N/A;  . LEFT HEART CATH AND CORONARY ANGIOGRAPHY N/A 11/29/2017   Procedure: LEFT HEART CATH AND CORONARY ANGIOGRAPHY;  Surgeon: Lyn Records, MD;  Location: [facility] INVASIVE CV LAB;  Service: Cardiovascular;  Laterality: N/A;  . LUMBAR DISC SURGERY     L5-S1    Current Medications: Current Meds  Medication Sig  . aspirin EC 81 MG tablet Take 81 mg by mouth daily.  Marland Kitchen atorvastatin (LIPITOR) 40 MG tablet Take 1 tablet (40 mg total) by mouth daily.  . clopidogrel (PLAVIX) 75 MG tablet First dose take 4 tabs = 300 mg; beginning day 2 start on 1 tablet daily = 75 mg daily; do not start until you finish Brilinta  . escitalopram (LEXAPRO) 10 MG tablet TAKE 1 TABLET BY MOUTH EVERY DAY  . levothyroxine (SYNTHROID, LEVOTHROID) 75 MCG tablet Take 1 tablet (75 mcg total) by mouth daily.  . metoprolol succinate (TOPROL-XL) 25 MG 24 hr tablet Take 0.5 tablets (12.5 mg total) by mouth daily.  . naproxen (NAPROSYN) 500 MG tablet Take 1 tablet (500 mg total) by mouth 2 (two) times daily with a meal.  . nitroGLYCERIN (NITROSTAT) 0.4 MG SL tablet Place 1 tablet (0.4 mg total) under  the tongue every 5 (five) minutes as needed for chest pain.  . pantoprazole (PROTONIX) 40 MG tablet TAKE 1 TABLET (40 MG TOTAL) BY MOUTH DAILY. FURTHER REFILLS COME FROM PCP.     Allergies:   Patient has no known allergies.   Social History   Socioeconomic History  . Marital status: Married    Spouse name: Not on file  . Number of children: 1  . Years of education: Not on file  . Highest education level: Not on file  Occupational History  . 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 Financial resource strain: Not on file  . Food insecurity:    Worry:  Not on file    Inability: Not on file  . Transportation needs:    Medical: Not on file    Non-medical: Not on file  Tobacco Use  . Smoking status: Former Smoker    Packs/day: 0.50    Years: 20.00    Pack years: 10.00    Last attempt to quit: 11/28/2017    Years since quitting: 1.0  . Smokeless tobacco: Never Used  Substance and Sexual Activity  . Alcohol use: Yes    Comment: 3 drinks daily  . Drug use: No  . Sexual activity: Yes    Comment: ptp states she cannot be pregnant, but not using anything to not get prenant  Lifestyle  . Physical activity:    Days per week: Not on file    Minutes per session: Not on file  . Stress: Very much  Relationships  . Social connections:    Talks on phone: Not on file    Gets together: Not on file    Attends religious service: Not on file    Active member of club or organization: Not on file    Attends meetings of clubs or organizations: Not on file    Relationship status: Not on file  Other Topics Concern  . Not on file  Social History Narrative   Married with one child.     Family History: The patient's family history includes Breast cancer (age of onset: 78) in her mother and sister; Heart disease in her brother. There is no history of Colon cancer, Esophageal cancer, Rectal cancer, or Stomach cancer.  ROS:   Please see the history of present illness.    Gaining weight.  Found to be hypothyroid.  Thyroid replacement therapy has been started.  All other systems reviewed and are negative.  EKGs/Labs/Other Studies Reviewed:    The following studies were reviewed today: 2D Doppler echocardiogram 12/04/2018: Study Conclusions  - Left ventricle: The cavity size was normal. Wall thickness was   normal. Systolic function was mildly to moderately reduced. The   estimated ejection fraction was in the range of 40% to 45%. There   is akinesis of the basal-midanteroseptal myocardium. Doppler   parameters are consistent with abnormal left  ventricular   relaxation (grade 1 diastolic dysfunction). GLS: -17.8% - Atrial septum: The septum bowed from left to right, consistent   with increased left atrial pressure.  EKG:  EKG is not repeated on today's visit.  The tracing from 08/25/2018 demonstrates a QS pattern V1 through V3.  No acute changes noted and no interval change compared to December 2018.  Recent Labs: 08/14/2018: ALT 14 08/25/2018: BUN 10; Creatinine, Ser 0.87; Hemoglobin 12.5; Platelets 303; Potassium 3.9; Sodium 140 10/30/2018: TSH 3.390  Recent Lipid Panel    Component Value Date/Time  CHOL 106 08/14/2018 0919   TRIG 75 08/14/2018 0919   HDL 47 08/14/2018 0919   CHOLHDL 2.3 08/14/2018 0919   CHOLHDL 2.3 11/30/2017 0012   VLDL 12 11/30/2017 0012   LDLCALC 44 08/14/2018 0919    Physical Exam:    VS:  BP (!) 108/58   Pulse 66   Ht 5\' 5"  (1.651 m)   Wt 150 lb 6.4 oz (68.2 kg)   SpO2 97%   BMI 25.03 kg/m     Wt Readings from Last 3 Encounters:  12/29/18 150 lb 6.4 oz (68.2 kg)  11/16/18 147 lb (66.7 kg)  10/30/18 147 lb 9.6 oz (67 kg)     GEN: Has gained weight.. No acute distress HEENT: Normal NECK: No JVD. LYMPHATICS: No lymphadenopathy CARDIAC: RRR.  No murmur, gallop, edema VASCULAR: Pulses are 2+ and symmetric in the carotids and brachial/radial bilateral., Bruits are not present in radial or carotids RESPIRATORY:  Clear to auscultation without rales, wheezing or rhonchi  ABDOMEN: Soft, non-tender, non-distended, No pulsatile mass, MUSCULOSKELETAL: No deformity  SKIN: Warm and dry NEUROLOGIC:  Alert and oriented x 3 PSYCHIATRIC:  Normal affect   ASSESSMENT:    1. Coronary artery disease involving native coronary artery of native heart without angina pectoris   2. Chronic combined systolic and diastolic heart failure (HCC)   3. Hyperlipidemia LDL goal <70    PLAN:    In order of problems listed above:  1. Coronary disease stable.  Received proximal LAD stent when she presented with  acute coronary syndrome and VF arrest in 2018.  Doing well without recurrent symptoms.  Aggressive secondary risk modification has been employed including smoking cessation, lipid-lowering, exercise, and diet change. 2. Low normal LV systolic function in the 40 to 45% range.  Stable over 12 months.  Continue current medical regimen which includes low-dose metoprolol.  Not on ARB/ACE. 3. Continue high intensity statin therapy.  LDL target less than 70.  Most recent value in August was 44.  Overall education and awareness concerning primary/secondary risk prevention was discussed in detail: LDL less than 70, hemoglobin A1c less than 7, blood pressure target less than 130/80 mmHg, >150 minutes of moderate aerobic activity per week, avoidance of smoking, weight control (via diet and exercise), and continued surveillance/management of/for obstructive sleep apnea.  1 year follow-up   Medication Adjustments/Labs and Tests Ordered: Current medicines are reviewed at length with the patient today.  Concerns regarding medicines are outlined above.  No orders of the defined types were placed in this encounter.  No orders of the defined types were placed in this encounter.   Patient Instructions  Medication Instructions:  Your physician recommends that you continue on your current medications as directed. Please refer to the Current Medication list given to you today.  If you need a refill on your cardiac medications before your next appointment, please call your pharmacy.   Lab work: None If you have labs (blood work) drawn today and your tests are completely normal, you will receive your results only by: Marland Kitchen MyChart Message (if you have MyChart) OR . A paper copy in the mail If you have any lab test that is abnormal or we need to change your treatment, we will call you to review the results.  Testing/Procedures: None  Follow-Up: At [facility], you and your health needs are our priority.  As  part of our continuing mission to provide you with exceptional heart care, we have created designated Provider Care Teams.  These Care Teams include your primary Cardiologist (physician) and Advanced Practice Providers (APPs -  Physician Assistants and Nurse Practitioners) who all work together to provide you with the care you need, when you need it. You will need a follow up appointment in 7 months.  Please call our office 2 months in advance to schedule this appointment.  You may see Lesleigh NoeHenry W Rio Kidane III, MD or one of the following Advanced Practice Providers on your designated Care Team:   Norma FredricksonLori Gerhardt, NP Nada BoozerLaura Ingold, NP . Georgie ChardJill McDaniel, NP  Any Other Special Instructions Will Be Listed Below (If Applicable).  Dr. Katrinka BlazingSmith recommends that you decrease caloric intake and exercise to help lose weight.      Signed, Lesleigh NoeHenry W Lenice Koper III, MD  12/29/2018 1:16 PM    [facility]

## 2018-12-29 ENCOUNTER — Encounter: Payer: Self-pay | Admitting: Interventional Cardiology

## 2018-12-29 ENCOUNTER — Ambulatory Visit: Payer: Federal, State, Local not specified - PPO | Admitting: Interventional Cardiology

## 2018-12-29 VITALS — BP 108/58 | HR 66 | Ht 65.0 in | Wt 150.4 lb

## 2018-12-29 DIAGNOSIS — I2510 Atherosclerotic heart disease of native coronary artery without angina pectoris: Secondary | ICD-10-CM

## 2018-12-29 DIAGNOSIS — I5042 Chronic combined systolic (congestive) and diastolic (congestive) heart failure: Secondary | ICD-10-CM

## 2018-12-29 DIAGNOSIS — E785 Hyperlipidemia, unspecified: Secondary | ICD-10-CM

## 2018-12-29 NOTE — Patient Instructions (Signed)
Medication Instructions:  Your physician recommends that you continue on your current medications as directed. Please refer to the Current Medication list given to you today.  If you need a refill on your cardiac medications before your next appointment, please call your pharmacy.   Lab work: None If you have labs (blood work) drawn today and your tests are completely normal, you will receive your results only by: Marland Kitchen MyChart Message (if you have MyChart) OR . A paper copy in the mail If you have any lab test that is abnormal or we need to change your treatment, we will call you to review the results.  Testing/Procedures: None  Follow-Up: At [facility], you and your health needs are our priority.  As part of our continuing mission to provide you with exceptional heart care, we have created designated Provider Care Teams.  These Care Teams include your primary Cardiologist (physician) and Advanced Practice Providers (APPs -  Physician Assistants and Nurse Practitioners) who all work together to provide you with the care you need, when you need it. You will need a follow up appointment in 7 months.  Please call our office 2 months in advance to schedule this appointment.  You may see Lesleigh Noe, MD or one of the following Advanced Practice Providers on your designated Care Team:   Norma Fredrickson, NP Nada Boozer, NP . Georgie Chard, NP  Any Other Special Instructions Will Be Listed Below (If Applicable).  Dr. Katrinka Blazing recommends that you decrease caloric intake and exercise to help lose weight.

## 2019-01-12 ENCOUNTER — Other Ambulatory Visit: Payer: Self-pay | Admitting: Family

## 2019-01-12 DIAGNOSIS — S63502A Unspecified sprain of left wrist, initial encounter: Secondary | ICD-10-CM

## 2019-01-12 DIAGNOSIS — M25532 Pain in left wrist: Secondary | ICD-10-CM

## 2019-02-12 ENCOUNTER — Other Ambulatory Visit: Payer: Self-pay | Admitting: Family

## 2019-02-12 DIAGNOSIS — S63502A Unspecified sprain of left wrist, initial encounter: Secondary | ICD-10-CM

## 2019-02-12 DIAGNOSIS — M25532 Pain in left wrist: Secondary | ICD-10-CM

## 2019-03-05 ENCOUNTER — Other Ambulatory Visit: Payer: Self-pay

## 2019-03-05 MED ORDER — ATORVASTATIN CALCIUM 40 MG PO TABS: 40.0000 mg | ORAL_TABLET | Freq: Every day | ORAL | 1 refills | Status: DC

## 2019-03-08 ENCOUNTER — Other Ambulatory Visit: Payer: Self-pay | Admitting: *Deleted

## 2019-03-08 MED ORDER — METOPROLOL SUCCINATE ER 25 MG PO TB24: 12.5000 mg | ORAL_TABLET | Freq: Every day | ORAL | 3 refills | Status: DC

## 2019-03-15 ENCOUNTER — Other Ambulatory Visit: Payer: Self-pay | Admitting: Family

## 2019-03-15 DIAGNOSIS — M25532 Pain in left wrist: Secondary | ICD-10-CM

## 2019-03-15 DIAGNOSIS — S63502A Unspecified sprain of left wrist, initial encounter: Secondary | ICD-10-CM

## 2019-03-15 NOTE — Telephone Encounter (Signed)
Last seen 11/16/18

## 2019-04-07 ENCOUNTER — Other Ambulatory Visit: Payer: Self-pay | Admitting: Family

## 2019-04-07 DIAGNOSIS — M25532 Pain in left wrist: Secondary | ICD-10-CM

## 2019-04-07 DIAGNOSIS — S63502A Unspecified sprain of left wrist, initial encounter: Secondary | ICD-10-CM

## 2019-04-11 ENCOUNTER — Other Ambulatory Visit: Payer: Self-pay | Admitting: Family Medicine

## 2019-04-11 DIAGNOSIS — Z1231 Encounter for screening mammogram for malignant neoplasm of breast: Secondary | ICD-10-CM

## 2019-04-18 ENCOUNTER — Other Ambulatory Visit: Payer: Self-pay | Admitting: Interventional Cardiology

## 2019-05-11 ENCOUNTER — Ambulatory Visit (INDEPENDENT_AMBULATORY_CARE_PROVIDER_SITE_OTHER): Payer: Federal, State, Local not specified - PPO | Admitting: Family

## 2019-05-11 ENCOUNTER — Encounter: Payer: Self-pay | Admitting: Family

## 2019-05-11 ENCOUNTER — Other Ambulatory Visit: Payer: Self-pay

## 2019-05-11 DIAGNOSIS — F411 Generalized anxiety disorder: Secondary | ICD-10-CM | POA: Diagnosis not present

## 2019-05-11 DIAGNOSIS — M1612 Unilateral primary osteoarthritis, left hip: Secondary | ICD-10-CM

## 2019-05-11 DIAGNOSIS — M1990 Unspecified osteoarthritis, unspecified site: Secondary | ICD-10-CM | POA: Insufficient documentation

## 2019-05-11 DIAGNOSIS — K219 Gastro-esophageal reflux disease without esophagitis: Secondary | ICD-10-CM

## 2019-05-11 DIAGNOSIS — E039 Hypothyroidism, unspecified: Secondary | ICD-10-CM

## 2019-05-11 DIAGNOSIS — I2510 Atherosclerotic heart disease of native coronary artery without angina pectoris: Secondary | ICD-10-CM

## 2019-05-11 DIAGNOSIS — E785 Hyperlipidemia, unspecified: Secondary | ICD-10-CM | POA: Diagnosis not present

## 2019-05-11 DIAGNOSIS — R5383 Other fatigue: Secondary | ICD-10-CM

## 2019-05-11 NOTE — Progress Notes (Signed)
Virtual Visit via telephone Note  I connected with Sarah Fox on 05/11/19 at 12:32 pm by telephone and verified that I am speaking with the correct person using two identifiers. Sarah Fox is currently located at [facility] and no one is currently with her during visit. The provider, Evelina Dun, FNP is located in their office at time of visit.  I discussed the limitations, risks, security and privacy concerns of performing an evaluation and management service by telephone and the availability of in person appointments. I also discussed with the patient that there may be a patient responsible charge related to this service. The patient expressed understanding and agreed to proceed.   History and Present Illness:  PT presents to the office today for chronic follow up. She is followed by Cardiologists every 6 months for CAD and STEMI in 2018.   She reports fatigue over the last few months that is unchanged.  Thyroid Problem  Presents for follow-up visit. Symptoms include anxiety and fatigue. Patient reports no constipation or diarrhea. The symptoms have been stable. Her past medical history is significant for hyperlipidemia.  Gastroesophageal Reflux  She complains of heartburn. She reports no belching. This is a chronic problem. The problem occurs occasionally. Associated symptoms include fatigue.  Anxiety  Presents for follow-up visit. Symptoms include excessive worry, irritability, nervous/anxious behavior and restlessness. Patient reports no decreased concentration. Symptoms occur most days. The quality of sleep is good.    Hyperlipidemia  This is a chronic problem. The current episode started more than 1 year ago. The problem is controlled. Recent lipid tests were reviewed and are normal. Current antihyperlipidemic treatment includes statins. The current treatment provides moderate improvement of lipids. Risk factors for coronary artery disease include dyslipidemia, hypertension  and a sedentary lifestyle.  Arthritis  Presents for follow-up visit. She complains of pain and stiffness. The symptoms have been stable. Affected locations include the left hip. Her pain is at a severity of 4/10. Associated symptoms include fatigue. Pertinent negatives include no diarrhea.      Review of Systems  Constitutional: Positive for fatigue and irritability.  Gastrointestinal: Positive for heartburn. Negative for constipation and diarrhea.  Musculoskeletal: Positive for arthritis and stiffness.  Psychiatric/Behavioral: Negative for decreased concentration. The patient is nervous/anxious.   All other systems reviewed and are negative.    Observations/Objective: No SOB or distress noted  Assessment and Plan: [name] comes in today with chief complaint of No chief complaint on file.   Diagnosis and orders addressed:  1. CAD in native artery - CMP14+EGFR; Future  2. GAD (generalized anxiety disorder) - CMP14+EGFR; Future  3. Gastroesophageal reflux disease, esophagitis presence not specified - CMP14+EGFR; Future  4. Hyperlipidemia LDL goal <70 - CMP14+EGFR; Future - Lipid panel; Future  5. Hypothyroidism, unspecified type - CMP14+EGFR; Future - TSH; Future  6. Primary osteoarthritis of left hip - CMP14+EGFR; Future  7. Fatigue, unspecified type Will do lab work, if normal will need to schedule follow up with Cardiologists  - Anemia Profile B; Future - CMP14+EGFR; Future - TSH; Future   Labs pending Health Maintenance reviewed Diet and exercise encouraged  Follow up plan: 3 months        I discussed the assessment and treatment plan with the patient. The patient was provided an opportunity to ask questions and all were answered. The patient agreed with the plan and demonstrated an understanding of the instructions.   The patient was advised to call back or seek an in-person  evaluation if the symptoms worsen or if the condition fails to  improve as anticipated.  The above assessment and management plan was discussed with the patient. The patient verbalized understanding of and has agreed to the management plan. Patient is aware to call the clinic if symptoms persist or worsen. Patient is aware when to return to the clinic for a follow-up visit. Patient educated on when it is appropriate to go to the emergency department.   Time call ended:  12:44pm  I provided 12 minutes of non-face-to-face time during this encounter.    Evelina Dun, FNP

## 2019-05-14 ENCOUNTER — Other Ambulatory Visit: Payer: Federal, State, Local not specified - PPO

## 2019-05-14 ENCOUNTER — Other Ambulatory Visit: Payer: Self-pay

## 2019-05-14 DIAGNOSIS — R5383 Other fatigue: Secondary | ICD-10-CM | POA: Diagnosis not present

## 2019-05-14 DIAGNOSIS — E039 Hypothyroidism, unspecified: Secondary | ICD-10-CM | POA: Diagnosis not present

## 2019-05-14 DIAGNOSIS — M1612 Unilateral primary osteoarthritis, left hip: Secondary | ICD-10-CM

## 2019-05-14 DIAGNOSIS — I2510 Atherosclerotic heart disease of native coronary artery without angina pectoris: Secondary | ICD-10-CM | POA: Diagnosis not present

## 2019-05-14 DIAGNOSIS — E785 Hyperlipidemia, unspecified: Secondary | ICD-10-CM

## 2019-05-14 DIAGNOSIS — K219 Gastro-esophageal reflux disease without esophagitis: Secondary | ICD-10-CM

## 2019-05-14 DIAGNOSIS — F411 Generalized anxiety disorder: Secondary | ICD-10-CM

## 2019-05-15 ENCOUNTER — Other Ambulatory Visit: Payer: Self-pay | Admitting: Family

## 2019-05-15 DIAGNOSIS — S63502A Unspecified sprain of left wrist, initial encounter: Secondary | ICD-10-CM

## 2019-05-15 DIAGNOSIS — M25532 Pain in left wrist: Secondary | ICD-10-CM

## 2019-05-15 LAB — CMP14+EGFR
ALT: 14 IU/L (ref 0–32)
AST: 16 IU/L (ref 0–40)
Albumin/Globulin Ratio: 1.8 (ref 1.2–2.2)
Albumin: 4.2 g/dL (ref 3.8–4.8)
Alkaline Phosphatase: 68 IU/L (ref 39–117)
BUN/Creatinine Ratio: 16 (ref 9–23)
BUN: 14 mg/dL (ref 6–24)
Bilirubin Total: 0.3 mg/dL (ref 0.0–1.2)
CO2: 21 mmol/L (ref 20–29)
Calcium: 9 mg/dL (ref 8.7–10.2)
Chloride: 105 mmol/L (ref 96–106)
Creatinine, Ser: 0.86 mg/dL (ref 0.57–1.00)
GFR calc Af Amer: 91 mL/min/{1.73_m2} (ref >59)
GFR calc non Af Amer: 79 mL/min/{1.73_m2} (ref >59)
Globulin, Total: 2.3 g/dL (ref 1.5–4.5)
Glucose: 86 mg/dL (ref 65–99)
Potassium: 4.3 mmol/L (ref 3.5–5.2)
Sodium: 139 mmol/L (ref 134–144)
Total Protein: 6.5 g/dL (ref 6.0–8.5)

## 2019-05-15 LAB — ANEMIA PROFILE B
Basophils Absolute: 0.1 10*3/uL (ref 0.0–0.2)
Basos: 1 %
EOS (ABSOLUTE): 0.8 10*3/uL — ABNORMAL HIGH (ref 0.0–0.4)
Eos: 11 %
Ferritin: 24 ng/mL (ref 15–150)
Folate: 4 ng/mL (ref >3.0)
Hematocrit: 38.3 % (ref 34.0–46.6)
Hemoglobin: 12.8 g/dL (ref 11.1–15.9)
Immature Grans (Abs): 0 10*3/uL (ref 0.0–0.1)
Immature Granulocytes: 0 %
Iron Saturation: 24 % (ref 15–55)
Iron: 76 ug/dL (ref 27–159)
Lymphocytes Absolute: 2.4 10*3/uL (ref 0.7–3.1)
Lymphs: 32 %
MCH: 30.3 pg (ref 26.6–33.0)
MCHC: 33.4 g/dL (ref 31.5–35.7)
MCV: 91 fL (ref 79–97)
Monocytes Absolute: 0.5 10*3/uL (ref 0.1–0.9)
Monocytes: 6 %
Neutrophils Absolute: 3.9 10*3/uL (ref 1.4–7.0)
Neutrophils: 50 %
Platelets: 331 10*3/uL (ref 150–450)
RBC: 4.23 x10E6/uL (ref 3.77–5.28)
RDW: 13.4 % (ref 11.7–15.4)
Retic Ct Pct: 1.4 % (ref 0.6–2.6)
Total Iron Binding Capacity: 315 ug/dL (ref 250–450)
UIBC: 239 ug/dL (ref 131–425)
Vitamin B-12: 386 pg/mL (ref 232–1245)
WBC: 7.7 10*3/uL (ref 3.4–10.8)

## 2019-05-15 LAB — LIPID PANEL
Chol/HDL Ratio: 2.6 ratio (ref 0.0–4.4)
Cholesterol, Total: 109 mg/dL (ref 100–199)
HDL: 42 mg/dL (ref >39)
LDL Calculated: 45 mg/dL (ref 0–99)
Triglycerides: 109 mg/dL (ref 0–149)
VLDL Cholesterol Cal: 22 mg/dL (ref 5–40)

## 2019-05-15 LAB — TSH: TSH: 4.03 u[IU]/mL (ref 0.450–4.500)

## 2019-05-16 ENCOUNTER — Other Ambulatory Visit: Payer: Self-pay | Admitting: Interventional Cardiology

## 2019-05-23 ENCOUNTER — Other Ambulatory Visit: Payer: Self-pay | Admitting: Interventional Cardiology

## 2019-05-28 ENCOUNTER — Other Ambulatory Visit: Payer: Self-pay | Admitting: Interventional Cardiology

## 2019-06-08 ENCOUNTER — Ambulatory Visit
Admission: RE | Admit: 2019-06-08 | Discharge: 2019-06-08 | Disposition: A | Discharge status: Home / Self Care | Payer: Federal, State, Local not specified - PPO | Source: Ambulatory Visit | Attending: Family Medicine | Admitting: Family Medicine

## 2019-06-08 ENCOUNTER — Other Ambulatory Visit: Payer: Self-pay

## 2019-06-08 DIAGNOSIS — Z1231 Encounter for screening mammogram for malignant neoplasm of breast: Secondary | ICD-10-CM

## 2019-06-11 ENCOUNTER — Other Ambulatory Visit: Payer: Self-pay | Admitting: Family

## 2019-06-11 ENCOUNTER — Other Ambulatory Visit: Payer: Self-pay | Admitting: Family Medicine

## 2019-06-11 DIAGNOSIS — R928 Other abnormal and inconclusive findings on diagnostic imaging of breast: Secondary | ICD-10-CM

## 2019-06-15 ENCOUNTER — Other Ambulatory Visit: Payer: Self-pay | Admitting: Family

## 2019-06-15 DIAGNOSIS — M25532 Pain in left wrist: Secondary | ICD-10-CM

## 2019-06-15 DIAGNOSIS — S63502A Unspecified sprain of left wrist, initial encounter: Secondary | ICD-10-CM

## 2019-06-19 ENCOUNTER — Ambulatory Visit
Admission: RE | Admit: 2019-06-19 | Discharge: 2019-06-19 | Disposition: A | Discharge status: Home / Self Care | Payer: Federal, State, Local not specified - PPO | Source: Ambulatory Visit | Attending: Family | Admitting: Family

## 2019-06-19 ENCOUNTER — Ambulatory Visit: Payer: Federal, State, Local not specified - PPO

## 2019-06-19 DIAGNOSIS — R928 Other abnormal and inconclusive findings on diagnostic imaging of breast: Secondary | ICD-10-CM

## 2019-06-19 DIAGNOSIS — Z6824 Body mass index (BMI) 24.0-24.9, adult: Secondary | ICD-10-CM | POA: Diagnosis not present

## 2019-06-19 DIAGNOSIS — T63301A Toxic effect of unspecified spider venom, accidental (unintentional), initial encounter: Secondary | ICD-10-CM | POA: Diagnosis not present

## 2019-06-19 DIAGNOSIS — L03115 Cellulitis of right lower limb: Secondary | ICD-10-CM | POA: Diagnosis not present

## 2019-06-19 DIAGNOSIS — R922 Inconclusive mammogram: Secondary | ICD-10-CM | POA: Diagnosis not present

## 2019-06-22 ENCOUNTER — Other Ambulatory Visit: Payer: Self-pay | Admitting: Interventional Cardiology

## 2019-07-12 ENCOUNTER — Other Ambulatory Visit: Payer: Self-pay | Admitting: Family

## 2019-07-12 DIAGNOSIS — M25532 Pain in left wrist: Secondary | ICD-10-CM

## 2019-07-12 DIAGNOSIS — S63502A Unspecified sprain of left wrist, initial encounter: Secondary | ICD-10-CM

## 2019-08-17 ENCOUNTER — Other Ambulatory Visit: Payer: Self-pay | Admitting: Family

## 2019-08-17 ENCOUNTER — Other Ambulatory Visit: Payer: Self-pay | Admitting: Interventional Cardiology

## 2019-08-19 ENCOUNTER — Other Ambulatory Visit: Payer: Self-pay | Admitting: Family

## 2019-08-19 DIAGNOSIS — S63502A Unspecified sprain of left wrist, initial encounter: Secondary | ICD-10-CM

## 2019-08-19 DIAGNOSIS — M25532 Pain in left wrist: Secondary | ICD-10-CM

## 2019-09-13 ENCOUNTER — Telehealth: Payer: Self-pay | Admitting: Interventional Cardiology

## 2019-09-13 NOTE — Telephone Encounter (Signed)
Pt called back and changed appt to phone visit.

## 2019-09-13 NOTE — Telephone Encounter (Signed)
Left message letting pt know that Dr. Smith will not be in the office 9/29 but will be doing virtual/telephone visits instead.  Advised to call back to make arrangements for appt.  

## 2019-09-24 NOTE — Progress Notes (Deleted)
Cardiology Office Note:    Date:  09/24/2019   ID:  Sarah, Fox [birth year], MRN [id number]  PCP:  Sharion Balloon, FNP  Cardiologist:  Sinclair Grooms, MD   Referring MD: Sharion Balloon, FNP   No chief complaint on file.   History of Present Illness:    Sarah Fox is a 53 y.o. female with a hx of CAD, Ant. MI accompanied by V fib and successful resuscitation and acute systolic HF-with recover to HFpEF.Recent chest pain 07/2018 requiring hospital obs.  ***  Past Medical History:  Diagnosis Date  . Acute ST elevation myocardial infarction (STEMI) of anterior wall ([facility])   . Allergy   . CAD in native artery   . Cardiac arrest with ventricular fibrillation ([place]) 10/2017   with acute MI and hypokalemia  . Coronary artery disease   . GERD (gastroesophageal reflux disease)   . Ischemic cardiomyopathy 10/2017  . Myocardial infarction acute ([place]) 10/2017  . VF (ventricular fibrillation) [facility])     Past Surgical History:  Procedure Laterality Date  . CORONARY STENT INTERVENTION N/A 11/29/2017   Procedure: CORONARY STENT INTERVENTION;  Surgeon: Belva Crome, MD;  Location: [place] CV LAB;  Service: Cardiovascular;  Laterality: N/A;  . CORONARY/GRAFT ACUTE MI REVASCULARIZATION N/A 11/29/2017   Procedure: Coronary/Graft Acute MI Revascularization;  Surgeon: Belva Crome, MD;  Location: [place] CV LAB;  Service: Cardiovascular;  Laterality: N/A;  . LEFT HEART CATH AND CORONARY ANGIOGRAPHY N/A 11/29/2017   Procedure: LEFT HEART CATH AND CORONARY ANGIOGRAPHY;  Surgeon: Belva Crome, MD;  Location: [place] CV LAB;  Service: Cardiovascular;  Laterality: N/A;  . LUMBAR DISC SURGERY     L5-S1    Current Medications: No outpatient medications have been marked as taking for the 09/25/19 encounter (Appointment) with Belva Crome, MD.     Allergies:   Patient has no known allergies.   Social History   Socioeconomic History  . Marital status: Married   Spouse name: Not on file  . Number of children: 1  . Years of education: Not on file  . Highest education level: Not on file  Occupational History  . Occupation: Buyer, retail: [employer]  Social Needs  . Financial resource strain: Not on file  . Food insecurity    Worry: Not on file    Inability: Not on file  . Transportation needs    Medical: Not on file    Non-medical: Not on file  Tobacco Use  . Smoking status: Former Smoker    Packs/day: 0.50    Years: 20.00    Pack years: 10.00    Quit date: 11/28/2017    Years since quitting: 1.8  . Smokeless tobacco: Never Used  Substance and Sexual Activity  . Alcohol use: Yes    Comment: 3 drinks daily  . Drug use: No  . Sexual activity: Yes    Comment: ptp states she cannot be pregnant, but not using anything to not get prenant  Lifestyle  . Physical activity    Days per week: Not on file    Minutes per session: Not on file  . Stress: Very much  Relationships  . Social Herbalist on phone: Not on file    Gets together: Not on file    Attends religious service: Not on file    Active member of club or organization: Not on file  Attends meetings of clubs or organizations: Not on file    Relationship status: Not on file  Other Topics Concern  . Not on file  Social History Narrative   Married with one child.     Family History: The patient's family history includes Breast cancer (age of onset: 30) in her mother and sister; Heart disease in her brother. There is no history of Colon cancer, Esophageal cancer, Rectal cancer, or Stomach cancer.  ROS:   Please see the history of present illness.    *** All other systems reviewed and are negative.  EKGs/Labs/Other Studies Reviewed:    The following studies were reviewed today: ***  EKG:  EKG ***  Recent Labs: 05/14/2019: ALT 14; BUN 14; Creatinine, Ser 0.86; Hemoglobin 12.8; Platelets 331; Potassium 4.3; Sodium 139; TSH 4.030  Recent Lipid Panel     Component Value Date/Time   CHOL 109 05/14/2019 0807   TRIG 109 05/14/2019 0807   HDL 42 05/14/2019 0807   CHOLHDL 2.6 05/14/2019 0807   CHOLHDL 2.3 11/30/2017 0012   VLDL 12 11/30/2017 0012   LDLCALC 45 05/14/2019 0807    Physical Exam:    VS:  There were no vitals taken for this visit.    Wt Readings from Last 3 Encounters:  12/29/18 150 lb 6.4 oz (68.2 kg)  11/16/18 147 lb (66.7 kg)  10/30/18 147 lb 9.6 oz (67 kg)     GEN: ***. No acute distress HEENT: Normal NECK: No JVD. LYMPHATICS: No lymphadenopathy CARDIAC: *** RRR without murmur, gallop, or edema. VASCULAR: *** Normal Pulses. No bruits. RESPIRATORY:  Clear to auscultation without rales, wheezing or rhonchi  ABDOMEN: Soft, non-tender, non-distended, No pulsatile mass, MUSCULOSKELETAL: No deformity  SKIN: Warm and dry NEUROLOGIC:  Alert and oriented x 3 PSYCHIATRIC:  Normal affect   ASSESSMENT:    1. Coronary artery disease involving native coronary artery of native heart without angina pectoris   2. Chronic combined systolic and diastolic heart failure (HCC)   3. Hyperlipidemia LDL goal <70   4. VF (ventricular fibrillation) (HCC)   5. Educated About Covid-19 Virus Infection    PLAN:    In order of problems listed above:  1. ***   Medication Adjustments/Labs and Tests Ordered: Current medicines are reviewed at length with the patient today.  Concerns regarding medicines are outlined above.  No orders of the defined types were placed in this encounter.  No orders of the defined types were placed in this encounter.   There are no Patient Instructions on file for this visit.   Signed, Lesleigh Noe, MD  09/24/2019 8:37 PM    [facility]

## 2019-09-25 ENCOUNTER — Encounter: Payer: Self-pay | Admitting: Interventional Cardiology

## 2019-09-25 ENCOUNTER — Encounter: Payer: Self-pay | Admitting: *Deleted

## 2019-09-25 ENCOUNTER — Other Ambulatory Visit: Payer: Self-pay

## 2019-09-25 ENCOUNTER — Telehealth (INDEPENDENT_AMBULATORY_CARE_PROVIDER_SITE_OTHER): Payer: Federal, State, Local not specified - PPO | Admitting: Interventional Cardiology

## 2019-09-25 VITALS — BP 144/85 | HR 88 | Ht 65.0 in

## 2019-09-25 DIAGNOSIS — I2510 Atherosclerotic heart disease of native coronary artery without angina pectoris: Secondary | ICD-10-CM

## 2019-09-25 DIAGNOSIS — R030 Elevated blood-pressure reading, without diagnosis of hypertension: Secondary | ICD-10-CM

## 2019-09-25 DIAGNOSIS — Z7189 Other specified counseling: Secondary | ICD-10-CM

## 2019-09-25 DIAGNOSIS — I4901 Ventricular fibrillation: Secondary | ICD-10-CM

## 2019-09-25 DIAGNOSIS — I5042 Chronic combined systolic (congestive) and diastolic (congestive) heart failure: Secondary | ICD-10-CM

## 2019-09-25 DIAGNOSIS — E785 Hyperlipidemia, unspecified: Secondary | ICD-10-CM

## 2019-09-25 MED ORDER — METOPROLOL SUCCINATE ER 25 MG PO TB24: 25.0000 mg | ORAL_TABLET | Freq: Every day | ORAL | 3 refills | Status: DC

## 2019-09-25 NOTE — Patient Instructions (Signed)
Medication Instructions:  1) INCREAES Metoprolol Succinate to 25mg  once daily  If you need a refill on your cardiac medications before your next appointment, please call your pharmacy.   Lab work: None If you have labs (blood work) drawn today and your tests are completely normal, you will receive your results only by: Marland Kitchen MyChart Message (if you have MyChart) OR . A paper copy in the mail If you have any lab test that is abnormal or we need to change your treatment, we will call you to review the results.  Testing/Procedures: None  Follow-Up: At [facility], you and your health needs are our priority.  As part of our continuing mission to provide you with exceptional heart care, we have created designated Provider Care Teams.  These Care Teams include your primary Cardiologist (physician) and Advanced Practice Providers (APPs -  Physician Assistants and Nurse Practitioners) who all work together to provide you with the care you need, when you need it. You will need a follow up appointment in 6-8 months.  Please call our office 2 months in advance to schedule this appointment.  You may see Sinclair Grooms, MD or one of the following Advanced Practice Providers on your designated Care Team:   Truitt Merle, NP Cecilie Kicks, NP . Kathyrn Drown, NP  Any Other Special Instructions Will Be Listed Below (If Applicable).

## 2019-09-25 NOTE — Addendum Note (Signed)
Addended by: Loren Racer on: 09/25/2019 02:05 PM   Modules accepted: Orders

## 2019-09-25 NOTE — Progress Notes (Signed)
Virtual Visit via Video Note   This visit type was conducted due to national recommendations for restrictions regarding the COVID-19 Pandemic (e.g. social distancing) in an effort to limit this patient's exposure and mitigate transmission in our community.  Due to her co-morbid illnesses, this patient is at least at moderate risk for complications without adequate follow up.  This format is felt to be most appropriate for this patient at this time.  All issues noted in this document were discussed and addressed.  A limited physical exam was performed with this format.  Please refer to the patient's chart for her consent to telehealth for [facility].   Date:  09/25/2019   ID:  Sarah Fox, DOB 25-Oct-1966, MRN [id number]  Patient Location: Home Provider Location: Home  PCP:  Sharion Balloon, FNP  Cardiologist:  Sinclair Grooms, MD  Electrophysiologist:  None   Evaluation Performed:  Follow-Up Visit  Chief Complaint:  CAD  History of Present Illness:    Sarah Fox is a 53 y.o. female with CAD, Ant. MI accompanied by V fib and successful resuscitation and acute systolic HF-with recover to HFpEF. RFM with lipid and tobacco management.  Sarah Fox has not had chest pain and denies dyspnea.  Still not smoking.  Both heart rate and blood pressure have been higher than previous.  2 blood pressures performed recently were 144/75 mmHg on one occasion and 144/85 mmHg with heart rate of 88 on another occasion (this morning).  She is working without difficulty.  She is not had syncope.  She has had occasional palpitation.  The patient does not have symptoms concerning for COVID-19 infection (fever, chills, cough, or new shortness of breath).    Past Medical History:  Diagnosis Date  . Acute ST elevation myocardial infarction (STEMI) of anterior wall ([place])   . Allergy   . CAD in native artery   . Cardiac arrest with ventricular fibrillation ([place]) 10/2017   with acute MI and  hypokalemia  . Coronary artery disease   . GERD (gastroesophageal reflux disease)   . Ischemic cardiomyopathy 10/2017  . Myocardial infarction acute ([place]) 10/2017  . VF (ventricular fibrillation) [facility])    Past Surgical History:  Procedure Laterality Date  . CORONARY STENT INTERVENTION N/A 11/29/2017   Procedure: CORONARY STENT INTERVENTION;  Surgeon: Belva Crome, MD;  Location: [place] [facility];  Service: Cardiovascular;  Laterality: N/A;  . CORONARY/GRAFT ACUTE MI REVASCULARIZATION N/A 11/29/2017   Procedure: Coronary/Graft Acute MI Revascularization;  Surgeon: Belva Crome, MD;  Location: [place] [facility];  Service: Cardiovascular;  Laterality: N/A;  . LEFT HEART CATH AND CORONARY ANGIOGRAPHY N/A 11/29/2017   Procedure: LEFT HEART CATH AND CORONARY ANGIOGRAPHY;  Surgeon: Belva Crome, MD;  Location: [place] [facility];  Service: Cardiovascular;  Laterality: N/A;  . LUMBAR DISC SURGERY     L5-S1     Current Meds  Medication Sig  . aspirin EC 81 MG tablet Take 81 mg by mouth daily.  Marland Kitchen atorvastatin (LIPITOR) 40 MG tablet Take 1 tablet (40 mg total) by mouth daily.  . clopidogrel (PLAVIX) 75 MG tablet 4 TABS FOR 1ST DOSE,THEN BEGINNING DAY 2, TAKE 1 TABLET DAILY, **START AFTER FINISHING BRILLINTA**  . escitalopram (LEXAPRO) 10 MG tablet TAKE 1 TABLET BY MOUTH EVERY DAY  . levothyroxine (SYNTHROID) 75 MCG tablet TAKE 1 TABLET BY MOUTH EVERY DAY  . metoprolol succinate (TOPROL-XL) 25 MG 24 hr tablet Take 0.5 tablets (12.5 mg total)  by mouth daily.  . naproxen (NAPROSYN) 500 MG tablet TAKE 1 TABLET (500 MG TOTAL) BY MOUTH 2 (TWO) TIMES DAILY WITH A MEAL.  . nitroGLYCERIN (NITROSTAT) 0.4 MG SL tablet Place 1 tablet (0.4 mg total) under the tongue every 5 (five) minutes as needed for chest pain.  . pantoprazole (PROTONIX) 40 MG tablet TAKE 1 TABLET (40 MG TOTAL) BY MOUTH DAILY. FURTHER REFILLS COME FROM PCP.     Allergies:   Patient has no known allergies.   Social History    Tobacco Use  . Smoking status: Former Smoker    Packs/day: 0.50    Years: 20.00    Pack years: 10.00    Quit date: 11/28/2017    Years since quitting: 1.8  . Smokeless tobacco: Never Used  Substance Use Topics  . Alcohol use: Yes    Comment: 3 drinks daily  . Drug use: No     Family Hx: The patient's family history includes Breast cancer (age of onset: 6) in her mother and sister; Heart disease in her brother. There is no history of Colon cancer, Esophageal cancer, Rectal cancer, or Stomach cancer.  ROS:   Please see the history of present illness.    Some anxiety but otherwise no complaints. All other systems reviewed and are negative. ECHOCARDIOGRAM 12/04/18: Study Conclusions  - Left ventricle: The cavity size was normal. Wall thickness was   normal. Systolic function was mildly to moderately reduced. The   estimated ejection fraction was in the range of 40% to 45%. There   is akinesis of the basal-midanteroseptal myocardium. Doppler   parameters are consistent with abnormal left ventricular   relaxation (grade 1 diastolic dysfunction). GLS: -17.8% - Atrial septum: The septum bowed from left to right, consistent   with increased left atrial pressure.  Prior CV studies:   The following studies were reviewed today:  No recent repeat CV images.  Labs/Other Tests and Data Reviewed:    EKG:  No ECG reviewed.  Recent Labs: 05/14/2019: ALT 14; BUN 14; Creatinine, Ser 0.86; Hemoglobin 12.8; Platelets 331; Potassium 4.3; Sodium 139; TSH 4.030   Recent Lipid Panel Lab Results  Component Value Date/Time   CHOL 109 05/14/2019 08:07 AM   TRIG 109 05/14/2019 08:07 AM   HDL 42 05/14/2019 08:07 AM   CHOLHDL 2.6 05/14/2019 08:07 AM   CHOLHDL 2.3 11/30/2017 12:12 AM   LDLCALC 45 05/14/2019 08:07 AM    Wt Readings from Last 3 Encounters:  12/29/18 150 lb 6.4 oz (68.2 kg)  11/16/18 147 lb (66.7 kg)  10/30/18 147 lb 9.6 oz (67 kg)     Objective:    Vital Signs:  BP (!)  144/85   Pulse 88   Ht 5\' 5"  (1.651 m)   BMI 25.03 kg/m    VITAL SIGNS:  reviewed  ASSESSMENT & PLAN:    1. Coronary artery disease involving native coronary artery of native heart without angina pectoris   2. Chronic combined systolic and diastolic heart failure (HCC)   3. Hyperlipidemia LDL goal <70   4. VF (ventricular fibrillation) (HCC)   5. Elevated blood pressure reading   6. Educated About Covid-19 Virus Infection    PLAN:  1. Stable from coronary standpoint.  Secondary prevention is again discussed.  She is doing well. 2. No symptoms to suggest volume overload or clinical CHF. 3. Last LDL was less than 70 when performed in May. 4. This was an acute complication during her myocardial infarction. 5. Increase metoprolol  succinate to 25 mg/day.  She will need follow-up in 4 to 6 months for repeat blood pressure evaluation and to assess lipid panel.  Overall education and awareness concerning primary/secondary risk prevention was discussed in detail: LDL less than 70, hemoglobin A1c less than 7, blood pressure target less than 130/80 mmHg, >150 minutes of moderate aerobic activity per week, avoidance of smoking, weight control (via diet and exercise), and continued surveillance/management of/for obstructive sleep apnea.  After being seen in the spring, unless new problems or identified, she will be seen once yearly.  COVID-19 Education: The signs and symptoms of COVID-19 were discussed with the patient and how to seek care for testing (follow up with PCP or arrange E-visit).  The importance of social distancing was discussed today.  Time:   Today, I have spent 18 minutes with the patient with telehealth technology discussing the above problems.     Medication Adjustments/Labs and Tests Ordered: Current medicines are reviewed at length with the patient today.  Concerns regarding medicines are outlined above.   Tests Ordered: No orders of the defined types were placed in  this encounter.   Medication Changes: No orders of the defined types were placed in this encounter.   Follow Up:  In Person in 6 month(s)  Signed, Lesleigh Noe, MD  09/25/2019 1:59 PM    [facility]

## 2019-11-28 ENCOUNTER — Other Ambulatory Visit: Payer: Self-pay | Admitting: Interventional Cardiology

## 2019-11-28 ENCOUNTER — Other Ambulatory Visit: Payer: Self-pay | Admitting: Family

## 2019-11-28 DIAGNOSIS — F419 Anxiety disorder, unspecified: Secondary | ICD-10-CM

## 2019-11-29 ENCOUNTER — Telehealth: Payer: Self-pay

## 2019-11-29 NOTE — Telephone Encounter (Signed)
I started a Pantoprazole PA through covermymeds. Key: [id number]

## 2019-12-03 ENCOUNTER — Encounter (HOSPITAL_COMMUNITY): Payer: Self-pay | Admitting: Emergency Medicine

## 2019-12-03 ENCOUNTER — Emergency Department (HOSPITAL_COMMUNITY): Payer: Federal, State, Local not specified - PPO

## 2019-12-03 ENCOUNTER — Emergency Department (HOSPITAL_COMMUNITY)
Admission: EM | Admit: 2019-12-03 | Discharge: 2019-12-03 | Disposition: A | Discharge status: Home / Self Care | Payer: Federal, State, Local not specified - PPO | Attending: Emergency Medicine | Admitting: Emergency Medicine

## 2019-12-03 DIAGNOSIS — Z5321 Procedure and treatment not carried out due to patient leaving prior to being seen by health care provider: Secondary | ICD-10-CM | POA: Insufficient documentation

## 2019-12-03 DIAGNOSIS — R079 Chest pain, unspecified: Secondary | ICD-10-CM | POA: Diagnosis not present

## 2019-12-03 DIAGNOSIS — M25512 Pain in left shoulder: Secondary | ICD-10-CM | POA: Diagnosis not present

## 2019-12-03 DIAGNOSIS — Z8679 Personal history of other diseases of the circulatory system: Secondary | ICD-10-CM | POA: Diagnosis not present

## 2019-12-03 DIAGNOSIS — M79622 Pain in left upper arm: Secondary | ICD-10-CM | POA: Diagnosis not present

## 2019-12-03 LAB — I-STAT BETA HCG BLOOD, ED (MC, WL, AP ONLY): I-stat hCG, quantitative: <5 m[IU]/mL (ref <5)

## 2019-12-03 LAB — CBC
HCT: 40.8 % (ref 36.0–46.0)
Hemoglobin: 13.6 g/dL (ref 12.0–15.0)
MCH: 30.9 pg (ref 26.0–34.0)
MCHC: 33.3 g/dL (ref 30.0–36.0)
MCV: 92.7 fL (ref 80.0–100.0)
Platelets: 319 10*3/uL (ref 150–400)
RBC: 4.4 MIL/uL (ref 3.87–5.11)
RDW: 12.9 % (ref 11.5–15.5)
WBC: 8.8 10*3/uL (ref 4.0–10.5)
nRBC: 0 % (ref 0.0–0.2)

## 2019-12-03 LAB — BASIC METABOLIC PANEL
Anion gap: 9 (ref 5–15)
BUN: 13 mg/dL (ref 6–20)
CO2: 25 mmol/L (ref 22–32)
Calcium: 9.3 mg/dL (ref 8.9–10.3)
Chloride: 104 mmol/L (ref 98–111)
Creatinine, Ser: 0.91 mg/dL (ref 0.44–1.00)
GFR calc Af Amer: >60 mL/min (ref >60)
GFR calc non Af Amer: >60 mL/min (ref >60)
Glucose, Bld: 106 mg/dL — ABNORMAL HIGH (ref 70–99)
Potassium: 3.5 mmol/L (ref 3.5–5.1)
Sodium: 138 mmol/L (ref 135–145)

## 2019-12-03 LAB — TROPONIN I (HIGH SENSITIVITY): Troponin I (High Sensitivity): <2 ng/L (ref <18)

## 2019-12-03 MED ORDER — SODIUM CHLORIDE 0.9% FLUSH: 3.0000 mL | Freq: Once | INTRAVENOUS | Status: DC

## 2019-12-03 NOTE — Telephone Encounter (Signed)
Letter received from [facility] stating that they have approved the pts Pantoprazole PA. Approval good from 10/30/2019 until 11/28/2020  I have notified [facility] of this approval.

## 2019-12-03 NOTE — ED Triage Notes (Signed)
pt had  sudden onset of left sided cp while just "sitting there" Pt states she has a hx of MI with stent placement.

## 2019-12-03 NOTE — ED Notes (Signed)
Pt states she is leaving. Asked if she would speak to sort RN. Patient states "that's ok im a heart patient and was supposed to have medicine at 6 and I am leaving to go home to take it." sort rn notified.

## 2019-12-21 ENCOUNTER — Other Ambulatory Visit: Payer: Self-pay | Admitting: Family

## 2019-12-21 DIAGNOSIS — F419 Anxiety disorder, unspecified: Secondary | ICD-10-CM

## 2020-01-21 ENCOUNTER — Other Ambulatory Visit: Payer: Self-pay | Admitting: Interventional Cardiology

## 2020-01-21 ENCOUNTER — Other Ambulatory Visit: Payer: Self-pay | Admitting: Family

## 2020-01-21 DIAGNOSIS — F419 Anxiety disorder, unspecified: Secondary | ICD-10-CM

## 2020-01-21 DIAGNOSIS — S63502A Unspecified sprain of left wrist, initial encounter: Secondary | ICD-10-CM

## 2020-01-21 DIAGNOSIS — M25532 Pain in left wrist: Secondary | ICD-10-CM

## 2020-01-22 ENCOUNTER — Ambulatory Visit: Payer: Federal, State, Local not specified - PPO | Admitting: Family

## 2020-01-22 ENCOUNTER — Encounter: Payer: Self-pay | Admitting: Family

## 2020-01-22 ENCOUNTER — Other Ambulatory Visit: Payer: Self-pay

## 2020-01-22 VITALS — BP 130/77 | HR 61 | Temp 98.4°F | Ht 60.0 in | Wt 156.8 lb

## 2020-01-22 DIAGNOSIS — E785 Hyperlipidemia, unspecified: Secondary | ICD-10-CM

## 2020-01-22 DIAGNOSIS — F411 Generalized anxiety disorder: Secondary | ICD-10-CM

## 2020-01-22 DIAGNOSIS — Z1211 Encounter for screening for malignant neoplasm of colon: Secondary | ICD-10-CM

## 2020-01-22 DIAGNOSIS — E039 Hypothyroidism, unspecified: Secondary | ICD-10-CM | POA: Diagnosis not present

## 2020-01-22 DIAGNOSIS — Z1212 Encounter for screening for malignant neoplasm of rectum: Secondary | ICD-10-CM

## 2020-01-22 DIAGNOSIS — K219 Gastro-esophageal reflux disease without esophagitis: Secondary | ICD-10-CM | POA: Diagnosis not present

## 2020-01-22 DIAGNOSIS — M1612 Unilateral primary osteoarthritis, left hip: Secondary | ICD-10-CM

## 2020-01-22 DIAGNOSIS — I2510 Atherosclerotic heart disease of native coronary artery without angina pectoris: Secondary | ICD-10-CM | POA: Diagnosis not present

## 2020-01-22 DIAGNOSIS — Z720 Tobacco use: Secondary | ICD-10-CM

## 2020-01-22 MED ORDER — ESCITALOPRAM OXALATE 20 MG PO TABS: 20.0000 mg | ORAL_TABLET | Freq: Every day | ORAL | 5 refills | Status: DC

## 2020-01-22 NOTE — Telephone Encounter (Signed)
[place]. NTBS 30 days given 12/24/19 

## 2020-01-22 NOTE — Patient Instructions (Signed)

## 2020-01-22 NOTE — Progress Notes (Signed)
Subjective:    Patient ID: Sarah Fox, female    DOB: [birth year], 54 y.o.   MRN: [id number]  Chief Complaint  Patient presents with  . Medication Refill    lexapro   PT presents to the office today for chronic follow up. She is followed by Cardiologists every 6 months for CAD and STEMI in 2018.   She reports she had intermittent chest pain and went to the ED,but waited for 5 hours and left. She reports this pain is resolved now. She has follow up appt with Cardiologists in March.  Gastroesophageal Reflux She complains of belching and heartburn. This is a chronic problem. The current episode started more than 1 year ago. The problem occurs occasionally. The problem has been resolved. The heartburn is of moderate intensity. She has tried a PPI for the symptoms. The treatment provided moderate relief.  Thyroid Problem Presents for follow-up visit. Symptoms include anxiety and depressed mood. Patient reports no constipation, diaphoresis or diarrhea. The symptoms have been stable. Her past medical history is significant for hyperlipidemia.  Arthritis Presents for follow-up visit. She complains of pain and joint swelling. The symptoms have been stable. Affected locations include the right MCP and left MCP. Her pain is at a severity of 0/10 (0 today, but was a 4 ). Pertinent negatives include no diarrhea.  Hyperlipidemia This is a chronic problem. The current episode started more than 1 year ago. Recent lipid tests were reviewed and are normal. Current antihyperlipidemic treatment includes statins. The current treatment provides moderate improvement of lipids.  Anxiety Presents for follow-up visit. Symptoms include depressed mood, excessive worry, irritability, nervous/anxious behavior and restlessness. Symptoms occur occasionally. The severity of symptoms is moderate. The quality of sleep is good.        Review of Systems  Constitutional: Positive for irritability. Negative for  diaphoresis.  Gastrointestinal: Positive for heartburn. Negative for constipation and diarrhea.  Musculoskeletal: Positive for arthritis and joint swelling.  Psychiatric/Behavioral: The patient is nervous/anxious.   All other systems reviewed and are negative.      Objective:   Physical Exam Vitals reviewed.  Constitutional:      General: She is not in acute distress.    Appearance: She is well-developed.  HENT:     Head: Normocephalic and atraumatic.     Right Ear: Tympanic membrane normal.     Left Ear: Tympanic membrane normal.  Eyes:     Pupils: Pupils are equal, round, and reactive to light.  Neck:     Thyroid: No thyromegaly.  Cardiovascular:     Rate and Rhythm: Normal rate and regular rhythm.     Heart sounds: Normal heart sounds. No murmur.  Pulmonary:     Effort: Pulmonary effort is normal. No respiratory distress.     Breath sounds: Normal breath sounds. No wheezing.  Abdominal:     General: Bowel sounds are normal. There is no distension.     Palpations: Abdomen is soft.     Tenderness: There is no abdominal tenderness.  Musculoskeletal:        General: No tenderness. Normal range of motion.     Cervical back: Normal range of motion and neck supple.  Skin:    General: Skin is warm and dry.  Neurological:     Mental Status: She is alert and oriented to person, place, and time.     Cranial Nerves: No cranial nerve deficit.     Deep Tendon Reflexes: Reflexes are normal and symmetric.  Psychiatric:        Behavior: Behavior normal.        Thought Content: Thought content normal.        Judgment: Judgment normal.       BP 130/77   Pulse 61   Temp 98.4 F (36.9 C) (Temporal)   Ht 5' (1.524 m)   Wt 156 lb 12.8 oz (71.1 kg)   SpO2 99%   BMI 30.62 kg/m      Assessment & Plan:  Sarah Fox comes in today with chief complaint of Medication Refill (lexapro)   Diagnosis and orders addressed:  1. CAD in native artery - CMP14+EGFR - CBC with  Differential/Platelet  2. Gastroesophageal reflux disease, unspecified whether esophagitis present - CMP14+EGFR - CBC with Differential/Platelet  3. Hypothyroidism, unspecified type - CMP14+EGFR - CBC with Differential/Platelet - TSH  4. Primary osteoarthritis of left hip - CMP14+EGFR - CBC with Differential/Platelet  5. GAD (generalized anxiety disorder) - CMP14+EGFR - CBC with Differential/Platelet  6. Hyperlipidemia LDL goal <70 - CMP14+EGFR - CBC with Differential/Platelet - Lipid panel  7. Tobacco use  8. Colon cancer screening - Cologuard  9. Screening for malignant neoplasm of the rectum - Cologuard   Labs pending Health Maintenance reviewed Diet and exercise encouraged  Follow up plan: 3 months, pt will call Cardiologists today and make follow up appt   Evelina Dun, FNP

## 2020-01-22 NOTE — Telephone Encounter (Signed)
lmtcb to schedule appt for follow up for further refills

## 2020-01-23 LAB — CMP14+EGFR
ALT: 17 IU/L (ref 0–32)
AST: 14 IU/L (ref 0–40)
Albumin/Globulin Ratio: 2.3 — ABNORMAL HIGH (ref 1.2–2.2)
Albumin: 4.5 g/dL (ref 3.8–4.9)
Alkaline Phosphatase: 78 IU/L (ref 39–117)
BUN/Creatinine Ratio: 16 (ref 9–23)
BUN: 12 mg/dL (ref 6–24)
Bilirubin Total: <0.2 mg/dL (ref 0.0–1.2)
CO2: 25 mmol/L (ref 20–29)
Calcium: 9.6 mg/dL (ref 8.7–10.2)
Chloride: 102 mmol/L (ref 96–106)
Creatinine, Ser: 0.73 mg/dL (ref 0.57–1.00)
GFR calc Af Amer: 110 mL/min/{1.73_m2} (ref >59)
GFR calc non Af Amer: 96 mL/min/{1.73_m2} (ref >59)
Globulin, Total: 2 g/dL (ref 1.5–4.5)
Glucose: 88 mg/dL (ref 65–99)
Potassium: 4.1 mmol/L (ref 3.5–5.2)
Sodium: 138 mmol/L (ref 134–144)
Total Protein: 6.5 g/dL (ref 6.0–8.5)

## 2020-01-23 LAB — CBC WITH DIFFERENTIAL/PLATELET
Basophils Absolute: 0 10*3/uL (ref 0.0–0.2)
Basos: 0 %
EOS (ABSOLUTE): 0.8 10*3/uL — ABNORMAL HIGH (ref 0.0–0.4)
Eos: 8 %
Hematocrit: 37.2 % (ref 34.0–46.6)
Hemoglobin: 12.7 g/dL (ref 11.1–15.9)
Immature Grans (Abs): 0 10*3/uL (ref 0.0–0.1)
Immature Granulocytes: 0 %
Lymphocytes Absolute: 3.2 10*3/uL — ABNORMAL HIGH (ref 0.7–3.1)
Lymphs: 33 %
MCH: 31.5 pg (ref 26.6–33.0)
MCHC: 34.1 g/dL (ref 31.5–35.7)
MCV: 92 fL (ref 79–97)
Monocytes Absolute: 0.6 10*3/uL (ref 0.1–0.9)
Monocytes: 6 %
Neutrophils Absolute: 5.1 10*3/uL (ref 1.4–7.0)
Neutrophils: 53 %
Platelets: 277 10*3/uL (ref 150–450)
RBC: 4.03 x10E6/uL (ref 3.77–5.28)
RDW: 13 % (ref 11.7–15.4)
WBC: 9.8 10*3/uL (ref 3.4–10.8)

## 2020-01-23 LAB — LIPID PANEL
Chol/HDL Ratio: 2.4 ratio (ref 0.0–4.4)
Cholesterol, Total: 109 mg/dL (ref 100–199)
HDL: 46 mg/dL (ref >39)
LDL Chol Calc (NIH): 41 mg/dL (ref 0–99)
Triglycerides: 126 mg/dL (ref 0–149)
VLDL Cholesterol Cal: 22 mg/dL (ref 5–40)

## 2020-01-23 LAB — TSH: TSH: 3.37 u[IU]/mL (ref 0.450–4.500)

## 2020-01-27 ENCOUNTER — Emergency Department (HOSPITAL_COMMUNITY): Payer: Federal, State, Local not specified - PPO

## 2020-01-27 ENCOUNTER — Encounter (HOSPITAL_COMMUNITY): Payer: Self-pay | Admitting: *Deleted

## 2020-01-27 ENCOUNTER — Other Ambulatory Visit: Payer: Self-pay | Admitting: Physician Assistant

## 2020-01-27 ENCOUNTER — Emergency Department (HOSPITAL_COMMUNITY)
Admission: EM | Admit: 2020-01-27 | Discharge: 2020-01-27 | Disposition: A | Discharge status: Home / Self Care | Payer: Federal, State, Local not specified - PPO | Attending: Emergency Medicine | Admitting: Emergency Medicine

## 2020-01-27 ENCOUNTER — Other Ambulatory Visit: Payer: Self-pay

## 2020-01-27 DIAGNOSIS — Z20822 Contact with and (suspected) exposure to covid-19: Secondary | ICD-10-CM | POA: Insufficient documentation

## 2020-01-27 DIAGNOSIS — R079 Chest pain, unspecified: Secondary | ICD-10-CM

## 2020-01-27 DIAGNOSIS — R61 Generalized hyperhidrosis: Secondary | ICD-10-CM | POA: Diagnosis not present

## 2020-01-27 DIAGNOSIS — I2510 Atherosclerotic heart disease of native coronary artery without angina pectoris: Secondary | ICD-10-CM | POA: Insufficient documentation

## 2020-01-27 DIAGNOSIS — I252 Old myocardial infarction: Secondary | ICD-10-CM | POA: Diagnosis not present

## 2020-01-27 DIAGNOSIS — E039 Hypothyroidism, unspecified: Secondary | ICD-10-CM | POA: Diagnosis not present

## 2020-01-27 DIAGNOSIS — K30 Functional dyspepsia: Secondary | ICD-10-CM | POA: Diagnosis not present

## 2020-01-27 DIAGNOSIS — R0789 Other chest pain: Secondary | ICD-10-CM | POA: Diagnosis not present

## 2020-01-27 DIAGNOSIS — Z7982 Long term (current) use of aspirin: Secondary | ICD-10-CM | POA: Insufficient documentation

## 2020-01-27 DIAGNOSIS — Z79899 Other long term (current) drug therapy: Secondary | ICD-10-CM | POA: Diagnosis not present

## 2020-01-27 DIAGNOSIS — Z87891 Personal history of nicotine dependence: Secondary | ICD-10-CM | POA: Insufficient documentation

## 2020-01-27 DIAGNOSIS — Z955 Presence of coronary angioplasty implant and graft: Secondary | ICD-10-CM | POA: Diagnosis not present

## 2020-01-27 DIAGNOSIS — I25118 Atherosclerotic heart disease of native coronary artery with other forms of angina pectoris: Secondary | ICD-10-CM | POA: Diagnosis not present

## 2020-01-27 HISTORY — DX: Disorder of thyroid, unspecified: E07.9

## 2020-01-27 LAB — CBC
HCT: 41 % (ref 36.0–46.0)
Hemoglobin: 13.5 g/dL (ref 12.0–15.0)
MCH: 31.2 pg (ref 26.0–34.0)
MCHC: 32.9 g/dL (ref 30.0–36.0)
MCV: 94.7 fL (ref 80.0–100.0)
Platelets: 307 10*3/uL (ref 150–400)
RBC: 4.33 MIL/uL (ref 3.87–5.11)
RDW: 13 % (ref 11.5–15.5)
WBC: 8.4 10*3/uL (ref 4.0–10.5)
nRBC: 0 % (ref 0.0–0.2)

## 2020-01-27 LAB — TROPONIN I (HIGH SENSITIVITY)
Troponin I (High Sensitivity): <2 ng/L (ref <18)
Troponin I (High Sensitivity): <2 ng/L (ref <18)
Troponin I (High Sensitivity): <2 ng/L (ref <18)

## 2020-01-27 LAB — BASIC METABOLIC PANEL
Anion gap: 6 (ref 5–15)
BUN: 13 mg/dL (ref 6–20)
CO2: 19 mmol/L — ABNORMAL LOW (ref 22–32)
Calcium: 9.1 mg/dL (ref 8.9–10.3)
Chloride: 111 mmol/L (ref 98–111)
Creatinine, Ser: 0.84 mg/dL (ref 0.44–1.00)
GFR calc Af Amer: >60 mL/min (ref >60)
GFR calc non Af Amer: >60 mL/min (ref >60)
Glucose, Bld: 99 mg/dL (ref 70–99)
Potassium: 4.2 mmol/L (ref 3.5–5.1)
Sodium: 136 mmol/L (ref 135–145)

## 2020-01-27 LAB — RESPIRATORY PANEL BY RT PCR (FLU A&B, COVID)
Influenza A by PCR: NEGATIVE
Influenza B by PCR: NEGATIVE
SARS Coronavirus 2 by RT PCR: NEGATIVE

## 2020-01-27 LAB — I-STAT BETA HCG BLOOD, ED (MC, WL, AP ONLY): I-stat hCG, quantitative: <5 m[IU]/mL (ref <5)

## 2020-01-27 NOTE — ED Provider Notes (Signed)
Medical screening examination/treatment/procedure(s) were conducted as a shared visit with non-physician practitioner(s) and myself.  I personally evaluated the patient during the encounter.    Patient has been well until this morning.  She had normal activities yesterday without fever chills or general illness.  At about 6 AM this morning she developed a burning central chest discomfort.  Along with that, she had fairly profuse diaphoresis.  She reports his symptoms were  Similar to what she experienced 2 years ago when she had a LAD MI.  Patient reports that she took a nitroglycerin and started to get some improvement but then the sweating recurred with some discomfort.  She took her full dose of aspirin and another nitroglycerin and symptoms then abated.  She has been symptom-free since that time.  Patient is alert and appropriate.  No respiratory distress.  All movements coordinated purposeful symmetric.  Patient's troponins have trended negative.  Patient had a very high risk lesion 2 years ago at young age.  She reports herself as having been symptom-free until this morning experiencing symptoms similar to her prior MI.  With stent and prior MI, request that cardiology do consultation to determine final disposition.   Arby Barrette, MD 01/27/20 1447

## 2020-01-27 NOTE — ED Provider Notes (Signed)
Patient received in signout pending cardiology consult.  Urology came to the bedside and personally evaluated the patient.  The cardiac PA and attending are recommending discharge home with plan:  outpatient Myoview within 1 week and cardiology follow-up after that.  Continue aspirin and Plavix.  Please see cardiology consult note for full details.   4:58 PM I evaluated patient.  She is resting comfortably in bed.  She has no chest pain at this time.  I used the teach back method to confirm the plan of outpatient stress test within 1 week and cardiology follow-up.  Patient is eager to be discharged home.  She has had no further chest pain while here in the emergency department.  Strict return precautions discussed including worsening chest pain, shortness of breath, diaphoresis, radiation of chest pain to arms/back/jaw, weakness, syncope, palpitations.  Patient appears reliable for outpatient follow-up.  He has no further questions at this time and is agreeable with plan of care.    Portions of this note were generated with Scientist, clinical (histocompatibility and immunogenetics). Dictation errors may occur despite best attempts at proofreading.   Vitals:   01/27/20 1145 01/27/20 1200 01/27/20 1215 01/27/20 1645  BP: 120/75 117/72 116/75 113/74  Pulse: (!) 57 61 (!) 57 62  Resp: 17 18 14 10   Temp:      TempSrc:      SpO2: 98% 98% 99% 100%  Weight:      Height:          , PA-C 01/27/20 1700    01/29/20, MD 01/27/20 2350

## 2020-01-27 NOTE — Consult Note (Addendum)
Cardiology Consultation:   Patient ID: Sarah Fox MRN: [id number]; DOB: 11-18-1966  Admit date: 01/27/2020 Date of Consult: 01/27/2020  Primary Care Provider: Junie Spencer, FNP Primary Cardiologist: Lesleigh Noe, MD  Primary Electrophysiologist:  None    Patient Profile:   Sarah Fox is a 54 y.o. female with a hx of CAD, ischemic cardiomyopathy, tobacco abuse and hyperlipidemia who is being seen today for the evaluation of chest pain at the request of Dr. Donnald Garre.  History of Present Illness:   Sarah Fox is a pleasant 54 year old female with past medical history of CAD, ischemic cardiomyopathy, tobacco abuse and hyperlipidemia.  Patient suffered a major anterior STEMI complicated by V. fib arrest in December 2018.  She received defibrillation x3 which terminated the V. fib.  Emergent cardiac catheterization revealed occluded proximal LAD with heavy thrombus burden treated with drug-eluting stent.  EF was initially 25% and patient was discharged on LifeVest.  She was placed on heart failure regimen and EF improved to 40 to 45% by echocardiogram in March 2019.  Last echocardiogram obtained on 12/04/2018 showed EF 40 to 45%, akinesis of the basal mid anteroseptal myocardium, grade 1 DD.  Since the previous MI, patient has been doing very well.  She was last seen by Dr. Katrinka Blazing via virtual visit on 09/25/2019 at which time, her blood pressure was mildly elevated, therefore Toprol-XL was increased to 25 mg daily.  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  She denies any recent exertional chest pain or shortness of breath.  She woke up around 6 AM today to take her medications.  When she got back into her bed, she started having significant diaphoresis and substernal burning sensation.  The location of the chest discomfort is very similar to the previous MI, however she says the previous MI feels more like a dull ache in the chest along with  diaphoresis, this time it was minimal burning sensation along with diaphoresis.  She took a dose of nitroglycerin symptom improved.  However a minute later, diaphoresis and the chest discomfort came back prompting her to take a second sublingual nitroglycerin and 4 baby aspirin.  She says she feels like she could pass out.  Chest burning sensation and diaphoresis went away after the second nitroglycerin and 4 baby aspirin and has not recurred since.  Total duration of burning sensation lasted about 10 minutes.  She has not had any recurrent symptom in close to 9 hours.  She sought medical attention at [facility] ED, 3 high-sensitivity serial troponin all came back negative.  Heart Pathway Score:     Past Medical History:  Diagnosis Date  . Acute ST elevation myocardial infarction (STEMI) of anterior wall (HCC)   . Allergy   . CAD in native artery   . Cardiac arrest with ventricular fibrillation (HCC) 10/2017   with acute MI and hypokalemia  . Coronary artery disease   . GERD (gastroesophageal reflux disease)   . Ischemic cardiomyopathy 10/2017  . Myocardial infarction acute (HCC) 10/2017  . Thyroid disease    hypo  . VF (ventricular fibrillation) [place])     Past Surgical History:  Procedure Laterality Date  . CORONARY STENT INTERVENTION N/A 11/29/2017   Procedure: CORONARY STENT INTERVENTION;  Surgeon: Lyn Records, MD;  Location: [facility] INVASIVE CV LAB;  Service: Cardiovascular;  Laterality: N/A;  . CORONARY/GRAFT ACUTE MI REVASCULARIZATION N/A 11/29/2017   Procedure: Coronary/Graft Acute MI  Revascularization;  Surgeon: Belva Crome, MD;  Location: [place] CV LAB;  Service: Cardiovascular;  Laterality: N/A;  . LEFT HEART CATH AND CORONARY ANGIOGRAPHY N/A 11/29/2017   Procedure: LEFT HEART CATH AND CORONARY ANGIOGRAPHY;  Surgeon: Belva Crome, MD;  Location: [place] CV LAB;  Service: Cardiovascular;  Laterality: N/A;  . LUMBAR DISC SURGERY     L5-S1     Home Medications:  Prior  to Admission medications   Medication Sig Start Date End Date Taking? Authorizing Provider  aspirin EC 81 MG tablet Take 81 mg by mouth daily.   Yes [provider]  atorvastatin (LIPITOR) 40 MG tablet TAKE 1 TABLET BY MOUTH EVERY DAY Patient taking differently: Take 40 mg by mouth daily.  11/29/19  Yes Belva Crome, MD  clopidogrel (PLAVIX) 75 MG tablet Take 1 tablet (75 mg total) by mouth daily. 01/22/20  Yes Belva Crome, MD  escitalopram (LEXAPRO) 20 MG tablet Take 1 tablet (20 mg total) by mouth daily. 01/22/20  Yes Hawks, Christy A, FNP  levothyroxine (SYNTHROID) 75 MCG tablet TAKE 1 TABLET BY MOUTH EVERY DAY Patient taking differently: Take 75 mcg by mouth daily.  08/17/19  Yes Hawks, Christy A, FNP  metoprolol succinate (TOPROL-XL) 25 MG 24 hr tablet Take 1 tablet (25 mg total) by mouth daily. 09/25/19  Yes Belva Crome, MD  nitroGLYCERIN (NITROSTAT) 0.4 MG SL tablet Place 1 tablet (0.4 mg total) under the tongue every 5 (five) minutes as needed for chest pain. 12/02/17  Yes Bhagat, Bhavinkumar, PA  pantoprazole (PROTONIX) 40 MG tablet Take 1 tablet (40 mg total) by mouth daily. 01/22/20  Yes Belva Crome, MD    Inpatient Medications: Scheduled Meds:  Continuous Infusions:  PRN Meds:   Allergies:   No Known Allergies  Social History:   Social History   Socioeconomic History  . Marital status: Married    Spouse name: Not on file  . Number of children: 1  . Years of education: Not on file  . Highest education level: Not on file  Occupational History  . Occupation: Buyer, retail: [employer]  Tobacco Use  . Smoking status: Former Smoker    Packs/day: 0.50    Years: 20.00    Pack years: 10.00    Quit date: 11/28/2017    Years since quitting: 2.1  . Smokeless tobacco: Never Used  Substance and Sexual Activity  . Alcohol use: Yes    Alcohol/week: 28.0 standard drinks    Types: 28 Cans of beer per week    Comment: 4 12 oz beers nightly  . Drug  use: No  . Sexual activity: Yes    Comment: ptp states she cannot be pregnant, but not using anything to not get prenant  Other Topics Concern  . Not on file  Social History Narrative   Married with one child.   Social Determinants of Health   Financial Resource Strain:   . Difficulty of Paying Living Expenses: Not on file  Food Insecurity:   . Worried About Charity fundraiser in the Last Year: Not on file  . Ran Out of Food in the Last Year: Not on file  Transportation Needs:   . Lack of Transportation (Medical): Not on file  . Lack of Transportation (Non-Medical): Not on file  Physical Activity:   . Days of Exercise per Week: Not on file  . Minutes of Exercise per Session: Not on file  Stress:   . Feeling of Stress : Not on file  Social Connections:   . Frequency of Communication with Friends and Family: Not on file  . Frequency of Social Gatherings with Friends and Family: Not on file  . Attends Religious Services: Not on file  . Active Member of Clubs or Organizations: Not on file  . Attends Banker Meetings: Not on file  . Marital Status: Not on file  Intimate Partner Violence:   . Fear of Current or Ex-Partner: Not on file  . Emotionally Abused: Not on file  . Physically Abused: Not on file  . Sexually Abused: Not on file    Family History:    Family History  Problem Relation Age of Onset  . Breast cancer Mother 49  . Breast cancer Sister 6  . Heart disease Brother   . Colon cancer Neg Hx   . Esophageal cancer Neg Hx   . Rectal cancer Neg Hx   . Stomach cancer Neg Hx      ROS:  Please see the history of present illness.   All other ROS reviewed and negative.     Physical Exam/Data:   Vitals:   01/27/20 1130 01/27/20 1145 01/27/20 1200 01/27/20 1215  BP: 121/78 120/75 117/72 116/75  Pulse: 61 (!) 57 61 (!) 57  Resp: 14 17 18 14   Temp:      TempSrc:      SpO2: 98% 98% 98% 99%  Weight:      Height:       No intake or output data in  the 24 hours ending 01/27/20 1628 Last 3 Weights 01/27/2020 01/22/2020 12/29/2018  Weight (lbs) 156 lb 12.8 oz 156 lb 12.8 oz 150 lb 6.4 oz  Weight (kg) 71.124 kg 71.124 kg 68.221 kg     Body mass index is 30.62 kg/m.  General:  Well nourished, well developed, in no acute distress HEENT: normal Lymph: no adenopathy Neck: no JVD Endocrine:  No thryomegaly Vascular: No carotid bruits; FA pulses 2+ bilaterally without bruits  Cardiac:  normal S1, S2; RRR; no murmur  Lungs:  clear to auscultation bilaterally, no wheezing, rhonchi or rales  Abd: soft, nontender, no hepatomegaly  Ext: no edema Musculoskeletal:  No deformities, BUE and BLE strength normal and equal Skin: warm and dry  Neuro:  CNs 2-12 intact, no focal abnormalities noted Psych:  Normal affect   EKG:  The EKG was personally reviewed and demonstrates:  NSR without significant ST-T wave changes Telemetry:  Telemetry was personally reviewed and demonstrates: Normal sinus rhythm with no significant ST-T wave changes.  Relevant CV Studies:  Echo 12/04/2018 LV EF: 40% -  45%  Study Conclusions   - Left ventricle: The cavity size was normal. Wall thickness was  normal. Systolic function was mildly to moderately reduced. The  estimated ejection fraction was in the range of 40% to 45%. There  is akinesis of the basal-midanteroseptal myocardium. Doppler  parameters are consistent with abnormal left ventricular  relaxation (grade 1 diastolic dysfunction). GLS: -17.8%  - Atrial septum: The septum bowed from left to right, consistent  with increased left atrial pressure.   Laboratory Data:  High Sensitivity Troponin:   Recent Labs  Lab 01/27/20 0812 01/27/20 0955 01/27/20 1220  TROPONINIHS <2 <2 <2     Chemistry Recent Labs  Lab 01/22/20 1512 01/27/20 0812  NA 138 136  K 4.1 4.2  CL 102 111  CO2 25 19*  GLUCOSE 88 99  BUN 12 13  CREATININE 0.73 0.84  CALCIUM 9.6 9.1  GFRNONAA 96 >60  GFRAA 110 >60    ANIONGAP  --  6    Recent Labs  Lab 01/22/20 1512  PROT 6.5  ALBUMIN 4.5  AST 14  ALT 17  ALKPHOS 78  BILITOT <0.2   Hematology Recent Labs  Lab 01/22/20 1512 01/27/20 0812  WBC 9.8 8.4  RBC 4.03 4.33  HGB 12.7 13.5  HCT 37.2 41.0  MCV 92 94.7  MCH 31.5 31.2  MCHC 34.1 32.9  RDW 13.0 13.0  PLT 277 307   BNPNo results for input(s): BNP, PROBNP in the last 168 hours.  DDimer No results for input(s): DDIMER in the last 168 hours.   Radiology/Studies:  DG Chest Port 1 View  Result Date: 01/27/2020 CLINICAL DATA:  Chest pain and diaphoresis. History of coronary disease. EXAM: PORTABLE CHEST 1 VIEW COMPARISON:  12/03/2019 FINDINGS: The heart size and mediastinal contours are within normal limits. Both lungs are clear. The visualized skeletal structures are unremarkable. IMPRESSION: No active disease. Electronically Signed   By: Paulina Fusi M.D.   On: 01/27/2020 08:15     Assessment and Plan:   1. Chest pain: Patient had 2 episode of chest pain this morning which responded to nitroglycerin however total duration of the chest pain was only 10 minutes.  There has been no recurrence in the past 9 hours.  Serial troponin negative x3.  Characteristic of the chest discomfort is different from the previous angina.  EKG is unchanged.  Case has been discussed with MD, plan for outpatient Myoview within 1 week and cardiology follow-up after that.  Continue aspirin and Plavix.  2. CAD: History of PCI to LAD in 2018.  Anterior MI complicated by V. fib arrest.  3. Ischemic cardiomyopathy: Baseline EF 45% based on last echocardiogram in 2019.  Patient appears to be euvolemic on physical exam.  4. Hyperlipidemia: On Lipitor  [facility] will sign off.   Medication Recommendations:  None Other recommendations (labs, testing, etc):  Outpatient myoview within this upcoming week Follow up as an outpatient:  followup with Dr. Katrinka Blazing or APP after [facility]  For questions or updates,  please contact [facility] Please consult [email or web address] for contact info under     Signed, Azalee Course, [place]  01/27/2020 4:28 PM   I have seen, examined the patient, and reviewed the above assessment and plan.  Changes to above are made where necessary.  On exam, RRR.  Pt with atypical symptoms this am, resolved after 15 minutes.  HS Tn negative x 3.  She has had no recurrence of pain or ekg change in past 9 hours.    OK to discharge to home with close follow-up with Dr Katrinka Blazing.  Outpatient myoview is advised.  Co Sign: Hillis Range, MD 01/27/2020 4:47 PM

## 2020-01-27 NOTE — ED Notes (Signed)
Vital signs stable.Patient denies pain and is resting comfortably. Patient is resting comfortably. 

## 2020-01-27 NOTE — ED Notes (Signed)
Pt restful and continues to deny any CP since arrival to ED.  Updated on POC.

## 2020-01-27 NOTE — ED Notes (Signed)
Up to bR without increased sx noted.  MD has been in to see and per pt will dc.  No needs noted at this time.  PO fluids given.

## 2020-01-27 NOTE — ED Provider Notes (Signed)
[place] EMERGENCY DEPARTMENT Provider Note   CSN: [id number] Arrival date & time: 01/27/20  0753     History Chief Complaint  Patient presents with  . Chest Pain    Sarah Fox is a 54 y.o. female with history of anterior STEMI with stent placement, CAD, GERD, ischemic cardiomyopathy, hyperlipidemia.  Patient reports that at 6:40 AM this morning she had sudden onset of a burning central chest pain that feels similar to "indigestion", however this was accompanied by diaphoresis and presyncope which is unusual for her.  She reports this feels very similar to an MI that she had in 2018 at which time she had a 99% thrombus of her LAD which was stented.  Patient reports that when she developed indigestion this morning it felt as a central chest burning sensation severe intensity constant nonradiating no clear aggravating factors.  She took 324 mg of aspirin and 1 nitro with relief of pain.  She reports that a few minutes later pain returned and she then took a second nitroglycerin and again had relief of symptoms.  She called [facility] who brought patient to this ER.  She denies any recent illness, fall/injury, fever/chills, cough, headache/neck pain, shortness of breath, hemoptysis, abdominal pain, extremity swelling/color change or any additional concerns.  Patient reports compliance with all her home medications.  HPI     Past Medical History:  Diagnosis Date  . Acute ST elevation myocardial infarction (STEMI) of anterior wall ([place])   . Allergy   . CAD in native artery   . Cardiac arrest with ventricular fibrillation ([place]) 10/2017   with acute MI and hypokalemia  . Coronary artery disease   . GERD (gastroesophageal reflux disease)   . Ischemic cardiomyopathy 10/2017  . Myocardial infarction acute ([place]) 10/2017  . VF (ventricular fibrillation) [facility])     Patient Active Problem List   Diagnosis Date Noted  . Osteoarthritis 05/11/2019  . GAD  (generalized anxiety disorder) 10/30/2018  . Hypothyroidism 08/29/2018  . Chest pain at rest 08/24/2018  . Chronic combined systolic and diastolic heart failure ([place]) 08/14/2018  . Hyperlipidemia LDL goal <70 08/14/2018  . GERD (gastroesophageal reflux disease)   . Coronary artery disease   . Allergy   . Acute ST elevation myocardial infarction (STEMI) of anterior wall ([place])   . CAD in native artery   . VF (ventricular fibrillation) ([place])   . Myocardial infarction acute ([place]) 10/27/2017  . Ischemic cardiomyopathy 10/27/2017  . Cardiac arrest with ventricular fibrillation ([place]) 10/27/2017  . Left shoulder pain 09/29/2017  . Right elbow pain 01/24/2017  . Ulcer-like dyspepsia 04/04/2012  . Tobacco use 04/04/2012    Past Surgical History:  Procedure Laterality Date  . CORONARY STENT INTERVENTION N/A 11/29/2017   Procedure: CORONARY STENT INTERVENTION;  Surgeon: Belva Crome, MD;  Location: [place] CV LAB;  Service: Cardiovascular;  Laterality: N/A;  . CORONARY/GRAFT ACUTE MI REVASCULARIZATION N/A 11/29/2017   Procedure: Coronary/Graft Acute MI Revascularization;  Surgeon: Belva Crome, MD;  Location: [place] CV LAB;  Service: Cardiovascular;  Laterality: N/A;  . LEFT HEART CATH AND CORONARY ANGIOGRAPHY N/A 11/29/2017   Procedure: LEFT HEART CATH AND CORONARY ANGIOGRAPHY;  Surgeon: Belva Crome, MD;  Location: [place] CV LAB;  Service: Cardiovascular;  Laterality: N/A;  . LUMBAR DISC SURGERY     L5-S1     OB History    Gravida  1   Para      Term  Preterm      AB      Living  1     SAB      TAB      Ectopic      Multiple      Live Births              Family History  Problem Relation Age of Onset  . Breast cancer Mother 74  . Breast cancer Sister 31  . Heart disease Brother   . Colon cancer Neg Hx   . Esophageal cancer Neg Hx   . Rectal cancer Neg Hx   . Stomach cancer Neg Hx     Social History   Tobacco Use  . Smoking status:  Former Smoker    Packs/day: 0.50    Years: 20.00    Pack years: 10.00    Quit date: 11/28/2017    Years since quitting: 2.1  . Smokeless tobacco: Never Used  Substance Use Topics  . Alcohol use: Yes    Comment: 3 drinks daily  . Drug use: No    Home Medications Prior to Admission medications   Medication Sig Start Date End Date Taking? Authorizing Provider  aspirin EC 81 MG tablet Take 81 mg by mouth daily.    [provider]  atorvastatin (LIPITOR) 40 MG tablet TAKE 1 TABLET BY MOUTH EVERY DAY 11/29/19   Lyn Records, MD  clopidogrel (PLAVIX) 75 MG tablet Take 1 tablet (75 mg total) by mouth daily. 01/22/20   Lyn Records, MD  escitalopram (LEXAPRO) 20 MG tablet Take 1 tablet (20 mg total) by mouth daily. 01/22/20   Junie Spencer, FNP  levothyroxine (SYNTHROID) 75 MCG tablet TAKE 1 TABLET BY MOUTH EVERY DAY 08/17/19   Jannifer Rodney A, FNP  metoprolol succinate (TOPROL-XL) 25 MG 24 hr tablet Take 1 tablet (25 mg total) by mouth daily. 09/25/19   Lyn Records, MD  nitroGLYCERIN (NITROSTAT) 0.4 MG SL tablet Place 1 tablet (0.4 mg total) under the tongue every 5 (five) minutes as needed for chest pain. 12/02/17   Bhagat, Sharrell Ku, PA  pantoprazole (PROTONIX) 40 MG tablet Take 1 tablet (40 mg total) by mouth daily. 01/22/20   Lyn Records, MD    Allergies    Patient has no known allergies.  Review of Systems   Review of Systems Ten systems are reviewed and are negative for acute change except as noted in the HPI  Physical Exam Updated Vital Signs Ht 5' (1.524 m)   Wt 71.1 kg   LMP 12/24/2019   BMI 30.62 kg/m   Physical Exam Constitutional:      General: She is not in acute distress.    Appearance: Normal appearance. She is well-developed. She is not ill-appearing or diaphoretic.  HENT:     Head: Normocephalic and atraumatic.     Right Ear: External ear normal.     Left Ear: External ear normal.     Nose: Nose normal.  Eyes:     General: Vision grossly  intact. Gaze aligned appropriately.     Pupils: Pupils are equal, round, and reactive to light.  Neck:     Trachea: Trachea and phonation normal. No tracheal deviation.  Cardiovascular:     Rate and Rhythm: Normal rate and regular rhythm.     Pulses:          Dorsalis pedis pulses are 2+ on the right side and 2+ on the left side.  Heart sounds: Normal heart sounds.  Pulmonary:     Effort: Pulmonary effort is normal. No respiratory distress.     Breath sounds: Normal breath sounds.  Abdominal:     General: There is no distension.     Palpations: Abdomen is soft.     Tenderness: There is no abdominal tenderness. There is no guarding or rebound.  Musculoskeletal:        General: Normal range of motion.     Cervical back: Normal range of motion.     Right lower leg: No tenderness. No edema.     Left lower leg: No tenderness. No edema.  Skin:    General: Skin is warm and dry.  Neurological:     Mental Status: She is alert.     GCS: GCS eye subscore is 4. GCS verbal subscore is 5. GCS motor subscore is 6.     Comments: Speech is clear and goal oriented, follows commands Major Cranial nerves without deficit, no facial droop Moves extremities without ataxia, coordination intact  Psychiatric:        Behavior: Behavior normal.     ED Results / Procedures / Treatments   Labs (all labs ordered are listed, but only abnormal results are displayed) Labs Reviewed - No data to display  EKG None  Radiology No results found.  Procedures Procedures (including critical care time)  Medications Ordered in ED Medications - No data to display  ED Course  I have reviewed the triage vital signs and the nursing notes.  Pertinent labs & imaging results that were available during my care of the patient were reviewed by me and considered in my medical decision making (see chart for details).  Most recent left heart cath 11/29/2017: Conclusion  Acute anterior myocardial infarction due to  occlusion of the proximal LAD with 99% thrombus contained obstruction with TIMI grade II flow on initial angiography.  Ventricular fibrillation requiring electrical conversion x3 in succession prior to PCI.  CPR was not necessary.  Hypokalemia of 3.0 was identified.  Successful LAD PCI following aspiration thrombectomy reducing 99% stenosis with TIMI grade II antegrade flow to 0% with TIMI grade III flow.  The 3.5 x 22 mm Onyx was postdilated to 3.75 mm at high pressure.  Normal right coronary artery.  Normal left main coronary artery.  Normal circumflex coronary artery.  Acute diastolic heart failure documented by moderate elevation in LVEDP. --------------- Most recent echocardiogram 12/04/2018: Study Conclusions  - Left ventricle: The cavity size was normal. Wall thickness was  normal. Systolic function was mildly to moderately reduced. The  estimated ejection fraction was in the range of 40% to 45%. There  is akinesis of the basal-midanteroseptal myocardium. Doppler  parameters are consistent with abnormal left ventricular  relaxation (grade 1 diastolic dysfunction). GLS: -17.8%  - Atrial septum: The septum bowed from left to right, consistent  with increased left atrial pressure.   Clinical Course as of Jan 27 916  Sun Jan 27, 2020  0915 Dr. Johney Frame   [BM]    Clinical Course User Index [BM] Elizabeth Palau   MDM Rules/Calculators/A&P                      Initial high-sensitivity troponin negative Beta-hCG negative CBC within normal limits BMP nonacute Chest x-ray:  IMPRESSION:  No active disease.   EKG reviewed with Dr. Donnald Garre without acute ischemic changes.  Based on patient's cardiac history and history of present illness will consult  cardiology for their input.  She is chest pain-free on arrival to the ER, well-appearing and in no acute distress.  Based on her previous cardiac history cannot use heart score. - 9:15 AM: Reviewed case with  cardiologist Dr. Johney Frame who recommends a 4-hour delta troponin and if negative that patient may follow-up outpatient with her cardiologist. - Patient reassessed multiple times each time she is resting comfortably no acute distress.  Denies return of pain reports she is feeling well.  2-hour and 4-hour troponin both negative.  Covid/flu panel negative. - Patient seen and evaluated by Dr. Donnald Garre, plan to re-consult cardiology. ---- Dr. Clarice Pole had conversation with Dr. Johney Frame, cardiology on way to see patient to determine final disposition.  Care handoff given to [facility]ze PA-C at shift change, dispo per cardiology.   Note: Portions of this report may have been transcribed using voice recognition software. Every effort was made to ensure accuracy; however, inadvertent computerized transcription errors may still be present. Final Clinical Impression(s) / ED Diagnoses Final diagnoses:  None    Rx / DC Orders ED Discharge Orders    None       Elizabeth Palau 01/27/20 1532    Arby Barrette, MD 01/28/20 [phone]

## 2020-01-27 NOTE — Discharge Instructions (Addendum)
There cardiology doctors are recommending:  -Outpatient Myoview within 1 week and cardiology follow-up after that.  Continue aspirin and Plavix.  Return to the emergency department for any new or worsening symptoms including worsening chest pain, shortness of breath, chest pain with activity or exertion, lower extremity edema, getting sweaty with the chest pain, with radiation of the chest pain to your arms or back.

## 2020-01-27 NOTE — ED Triage Notes (Signed)
Pt states 2 episodes of acute onset "indigestion" pain this am accompanied by feeling of syncope and diaphoresis.  Pain was relieved by 324 asa and 2 nitro.  Hx of 1 MI with sent placement.

## 2020-01-31 ENCOUNTER — Telehealth (HOSPITAL_COMMUNITY): Payer: Self-pay | Admitting: *Deleted

## 2020-01-31 NOTE — Telephone Encounter (Signed)
Left message on voicemail per DPR in reference to upcoming appointment scheduled on 02/04/20 with detailed instructions given per Myocardial Perfusion Study Information Sheet for the test. LM to arrive 15 minutes early, and that it is imperative to arrive on time for appointment to keep from having the test rescheduled. If you need to cancel or reschedule your appointment, please call the office within 24 hours of your appointment. Failure to do so may result in a cancellation of your appointment, and a $50 no show fee. Phone number given for call back for any questions. Daniell Paradise Jacqueline    

## 2020-02-04 ENCOUNTER — Other Ambulatory Visit: Payer: Self-pay

## 2020-02-04 ENCOUNTER — Ambulatory Visit (HOSPITAL_COMMUNITY): Payer: Federal, State, Local not specified - PPO | Attending: Cardiovascular Disease

## 2020-02-04 DIAGNOSIS — R079 Chest pain, unspecified: Secondary | ICD-10-CM | POA: Diagnosis not present

## 2020-02-04 LAB — MYOCARDIAL PERFUSION IMAGING
LV dias vol: 97 mL (ref 46–106)
LV sys vol: 46 mL
Peak HR: 92 {beats}/min
Rest HR: 59 {beats}/min
SDS: 3
SRS: 3
SSS: 6
TID: 1.04

## 2020-02-04 MED ORDER — TECHNETIUM TC 99M TETROFOSMIN IV KIT
31.2000 | PACK | Freq: Once | INTRAVENOUS | Status: AC | PRN
  Administered 2020-02-04: 31.2 via INTRAVENOUS
  Filled 2020-02-04: qty 32

## 2020-02-04 MED ORDER — TECHNETIUM TC 99M TETROFOSMIN IV KIT
10.1000 | PACK | Freq: Once | INTRAVENOUS | Status: AC | PRN
  Administered 2020-02-04: 10.1 via INTRAVENOUS
  Filled 2020-02-04: qty 11

## 2020-02-04 MED ORDER — REGADENOSON 0.4 MG/5ML IV SOLN
0.4000 mg | Freq: Once | INTRAVENOUS | Status: AC
  Administered 2020-02-04: 0.4 mg via INTRAVENOUS

## 2020-02-14 NOTE — Progress Notes (Signed)
Cardiology Office Note:    Date:  02/15/2020   ID:  Sarah Fox December 15, 1966, MRN [id number]  PCP:  Sharion Balloon, FNP  Cardiologist:  Sinclair Grooms, MD   Referring MD: Sharion Balloon, FNP   Chief Complaint  Patient presents with  . Coronary Artery Disease  . Coronary Artery Disease    Proximal LAD stent 2018    History of Present Illness:    Sarah Fox is a 54 y.o. female with a hx of  CAD, Ant. MI accompanied by V fib and successful resuscitation and acute systolic HF-with recover to HFpEF. RFM with lipid and tobacco management.  Has had episodes of subcostal pain in December.  She went to the emergency room because of the left subcostal discomfort.  She never saw physician but spent 5 hours in the emergency room.  An EKG and delta troponin determinations were made and were unremarkable.  She returned to the emergency room by ambulance on January 31.  She had nausea, indigestion, and diaphoresis very similar to that which occurred with her MI.  There was no chest pain.  She took several aspirin and 2 nitroglycerin sublingually and the feeling went away in less than 5 minutes.  She was taken to the emergency room.  Delta troponins were performed.  EKG and chest x-ray were done and unremarkable.  She subsequently had a myocardial perfusion study done on February 8, which was low risk showing apical fixed defect from prior infarction.  Past Medical History:  Diagnosis Date  . Acute ST elevation myocardial infarction (STEMI) of anterior wall ([place])   . Allergy   . CAD in native artery   . Cardiac arrest with ventricular fibrillation ([place]) 10/2017   with acute MI and hypokalemia  . Coronary artery disease   . GERD (gastroesophageal reflux disease)   . Ischemic cardiomyopathy 10/2017  . Myocardial infarction acute ([place]) 10/2017  . Thyroid disease    hypo  . VF (ventricular fibrillation) [facility])     Past Surgical History:  Procedure Laterality Date  . CORONARY  STENT INTERVENTION N/A 11/29/2017   Procedure: CORONARY STENT INTERVENTION;  Surgeon: Belva Crome, MD;  Location: [place] [facility];  Service: Cardiovascular;  Laterality: N/A;  . CORONARY/GRAFT ACUTE MI REVASCULARIZATION N/A 11/29/2017   Procedure: Coronary/Graft Acute MI Revascularization;  Surgeon: Belva Crome, MD;  Location: [place] [facility];  Service: Cardiovascular;  Laterality: N/A;  . LEFT HEART CATH AND CORONARY ANGIOGRAPHY N/A 11/29/2017   Procedure: LEFT HEART CATH AND CORONARY ANGIOGRAPHY;  Surgeon: Belva Crome, MD;  Location: [place] [facility];  Service: Cardiovascular;  Laterality: N/A;  . LUMBAR DISC SURGERY     L5-S1    Current Medications: Current Meds  Medication Sig  . aspirin EC 81 MG tablet Take 81 mg by mouth daily.  Marland Kitchen atorvastatin (LIPITOR) 40 MG tablet TAKE 1 TABLET BY MOUTH EVERY DAY  . clopidogrel (PLAVIX) 75 MG tablet Take 1 tablet (75 mg total) by mouth daily.  Marland Kitchen escitalopram (LEXAPRO) 20 MG tablet TAKE 1 TABLET BY MOUTH EVERY DAY  . levothyroxine (SYNTHROID) 75 MCG tablet TAKE 1 TABLET BY MOUTH EVERY DAY  . metoprolol succinate (TOPROL-XL) 25 MG 24 hr tablet Take 1 tablet (25 mg total) by mouth daily.  . nitroGLYCERIN (NITROSTAT) 0.4 MG SL tablet Place 1 tablet (0.4 mg total) under the tongue every 5 (five) minutes as needed for chest pain.  . pantoprazole (PROTONIX) 40 MG  tablet Take 1 tablet (40 mg total) by mouth daily.  . [DISCONTINUED] nitroGLYCERIN (NITROSTAT) 0.4 MG SL tablet Place 1 tablet (0.4 mg total) under the tongue every 5 (five) minutes as needed for chest pain.     Allergies:   Patient has no known allergies.   Social History   Socioeconomic History  . Marital status: Married    Spouse name: Not on file  . Number of children: 1  . Years of education: Not on file  . Highest education level: Not on file  Occupational History  . Occupation: Retail banker: [employer]  Tobacco Use  . Smoking status: Former Smoker      Packs/day: 0.50    Years: 20.00    Pack years: 10.00    Quit date: 11/28/2017    Years since quitting: 2.2  . Smokeless tobacco: Never Used  Substance and Sexual Activity  . Alcohol use: Yes    Alcohol/week: 28.0 standard drinks    Types: 28 Cans of beer per week    Comment: 4 12 oz beers nightly  . Drug use: No  . Sexual activity: Yes    Comment: ptp states she cannot be pregnant, but not using anything to not get prenant  Other Topics Concern  . Not on file  Social History Narrative   Married with one child.   Social Determinants of Health   Financial Resource Strain:   . Difficulty of Paying Living Expenses: Not on file  Food Insecurity:   . Worried About Programme researcher, broadcasting/film/video in the Last Year: Not on file  . Ran Out of Food in the Last Year: Not on file  Transportation Needs:   . Lack of Transportation (Medical): Not on file  . Lack of Transportation (Non-Medical): Not on file  Physical Activity:   . Days of Exercise per Week: Not on file  . Minutes of Exercise per Session: Not on file  Stress:   . Feeling of Stress : Not on file  Social Connections:   . Frequency of Communication with Friends and Family: Not on file  . Frequency of Social Gatherings with Friends and Family: Not on file  . Attends Religious Services: Not on file  . Active Member of Clubs or Organizations: Not on file  . Attends Banker Meetings: Not on file  . Marital Status: Not on file     Family History: The patient's family history includes Breast cancer (age of onset: 19) in her mother and sister; Heart disease in her brother. There is no history of Colon cancer, Esophageal cancer, Rectal cancer, or Stomach cancer.  ROS:   Please see the history of present illness.    She has been having difficulty sleeping.  She wakes up several times each night.  Her Lexapro dose has been increased to 20 mg.  This is helped to regulate sleep.  All other systems reviewed and are  negative.  EKGs/Labs/Other Studies Reviewed:    The following studies were reviewed today: Myocardial perfusion imaging performed 02/04/2020 Study Highlights    Nuclear stress EF: 52%.  There was no ST segment deviation noted during stress.  This is a low risk study.  The left ventricular ejection fraction is mildly decreased (45-54%).   Mild thinning of apex and basal septum ? Breast/extra cardiac attenuation No ischemia EF low normal 52% with no RWMA Low risk study      EKG:  EKG EKGs performed in December and  also on January 31 were unremarkable.  They demonstrated evidence of prior anteroseptal infarction.  Recent Labs: 01/22/2020: ALT 17; TSH 3.370 01/27/2020: BUN 13; Creatinine, Ser 0.84; Hemoglobin 13.5; Platelets 307; Potassium 4.2; Sodium 136  Recent Lipid Panel    Component Value Date/Time   CHOL 109 01/22/2020 1512   TRIG 126 01/22/2020 1512   HDL 46 01/22/2020 1512   CHOLHDL 2.4 01/22/2020 1512   CHOLHDL 2.3 11/30/2017 0012   VLDL 12 11/30/2017 0012   LDLCALC 41 01/22/2020 1512    Physical Exam:    VS:  BP 98/64   Pulse 77   Ht 5' (1.524 m)   Wt 152 lb (68.9 kg)   SpO2 98%   BMI 29.69 kg/m     Wt Readings from Last 3 Encounters:  02/15/20 152 lb (68.9 kg)  02/04/20 156 lb (70.8 kg)  01/27/20 156 lb 12.8 oz (71.1 kg)     GEN: Mild abdominal obesity.. No acute distress HEENT: Normal NECK: No JVD. LYMPHATICS: No lymphadenopathy CARDIAC:  RRR without murmur, gallop, or edema. VASCULAR:  Normal Pulses. No bruits. RESPIRATORY:  Clear to auscultation without rales, wheezing or rhonchi  ABDOMEN: Soft, non-tender, non-distended, No pulsatile mass, MUSCULOSKELETAL: No deformity  SKIN: Warm and dry NEUROLOGIC:  Alert and oriented x 3 PSYCHIATRIC:  Normal affect   ASSESSMENT:    1. Coronary artery disease involving native coronary artery of native heart without angina pectoris   2. Chronic combined systolic and diastolic heart failure (HCC)   3.  Hyperlipidemia LDL goal <70   4. VF (ventricular fibrillation) (HCC)   5. Mixed hyperlipidemia   6. Educated about COVID-19 virus infection    PLAN:    In order of problems listed above:  1. Secondary prevention discussed.  We discussed whether coronary angiography should be performed.  Now nearly 3 weeks after the hospital visit, and recently completed a low risk myocardial perfusion study, we have decided to continue observation.  Should the symptoms recur, my next step would be to perform coronary angiography. 2. No evidence of volume overload 3. Most recent lipids revealed an LDL of 45 in May 2020. 4. Occurred primarily during acute anterior infarction. 5. We discussed the COVID-19 vaccine.  She does not plan to take it.  I strongly suggested that she get the vaccine given her high risk of severe disease should she develop COVID-19 disease.  Also strongly encouraged social distancing and the 3W's which she follows only marginally.  Overall education and awareness concerning primary/secondary risk prevention was discussed in detail: LDL less than 70, hemoglobin A1c less than 7, blood pressure target less than 130/80 mmHg, >150 minutes of moderate aerobic activity per week, avoidance of smoking, weight control (via diet and exercise), and continued surveillance/management of/for obstructive sleep apnea.    Medication Adjustments/Labs and Tests Ordered: Current medicines are reviewed at length with the patient today.  Concerns regarding medicines are outlined above.  No orders of the defined types were placed in this encounter.  Meds ordered this encounter  Medications  . nitroGLYCERIN (NITROSTAT) 0.4 MG SL tablet    Sig: Place 1 tablet (0.4 mg total) under the tongue every 5 (five) minutes as needed for chest pain.    Dispense:  25 tablet    Refill:  3    Patient Instructions  Medication Instructions:  Your physician recommends that you continue on your current medications as  directed. Please refer to the Current Medication list given to you today.  *If you  need a refill on your cardiac medications before your next appointment, please call your pharmacy*  Lab Work: None If you have labs (blood work) drawn today and your tests are completely normal, you will receive your results only by: Marland Kitchen MyChart Message (if you have MyChart) OR . A paper copy in the mail If you have any lab test that is abnormal or we need to change your treatment, we will call you to review the results.  Testing/Procedures: None  Follow-Up: At [facility], you and your health needs are our priority.  As part of our continuing mission to provide you with exceptional heart care, we have created designated Provider Care Teams.  These Care Teams include your primary Cardiologist (physician) and Advanced Practice Providers (APPs -  Physician Assistants and Nurse Practitioners) who all work together to provide you with the care you need, when you need it.  Your next appointment:   6 month(s)  The format for your next appointment:   In Person  Provider:   You may see Lesleigh Noe, MD or one of the following Advanced Practice Providers on your designated Care Team:    Norma Fredrickson, NP  Nada Boozer, NP  Georgie Chard, NP   Other Instructions      Signed, Lesleigh Noe, MD  02/15/2020 5:05 PM    [facility]

## 2020-02-15 ENCOUNTER — Encounter: Payer: Self-pay | Admitting: Interventional Cardiology

## 2020-02-15 ENCOUNTER — Other Ambulatory Visit: Payer: Self-pay | Admitting: Family

## 2020-02-15 ENCOUNTER — Ambulatory Visit: Payer: Federal, State, Local not specified - PPO | Admitting: Interventional Cardiology

## 2020-02-15 ENCOUNTER — Other Ambulatory Visit: Payer: Self-pay

## 2020-02-15 VITALS — BP 98/64 | HR 77 | Ht 60.0 in | Wt 152.0 lb

## 2020-02-15 DIAGNOSIS — E785 Hyperlipidemia, unspecified: Secondary | ICD-10-CM | POA: Diagnosis not present

## 2020-02-15 DIAGNOSIS — I5042 Chronic combined systolic (congestive) and diastolic (congestive) heart failure: Secondary | ICD-10-CM

## 2020-02-15 DIAGNOSIS — I2510 Atherosclerotic heart disease of native coronary artery without angina pectoris: Secondary | ICD-10-CM | POA: Diagnosis not present

## 2020-02-15 DIAGNOSIS — I4901 Ventricular fibrillation: Secondary | ICD-10-CM

## 2020-02-15 DIAGNOSIS — Z7189 Other specified counseling: Secondary | ICD-10-CM

## 2020-02-15 MED ORDER — NITROGLYCERIN 0.4 MG SL SUBL: 0.4000 mg | SUBLINGUAL_TABLET | SUBLINGUAL | 3 refills | Status: AC | PRN

## 2020-02-15 NOTE — Patient Instructions (Signed)

## 2020-03-10 ENCOUNTER — Other Ambulatory Visit: Payer: Self-pay | Admitting: Interventional Cardiology

## 2020-03-10 ENCOUNTER — Telehealth: Payer: Self-pay | Admitting: Interventional Cardiology

## 2020-03-10 NOTE — Telephone Encounter (Signed)
Spoke with pt and she states that she has always heard her heartbeat in her ear on occasion but it seems to be happening more often.  Denies pain or any other issues.  Spoke with Dr. Katrinka Blazing and he said nothing to do about it, no concerns.  Made pt aware.  Pt appreciative for call.

## 2020-03-10 NOTE — Telephone Encounter (Signed)
New Message:    Pt says she can hear her heat beating in her left ear. She says she is very concerned about this, she says it happen about 10 times a day.

## 2020-05-09 ENCOUNTER — Ambulatory Visit: Payer: Federal, State, Local not specified - PPO | Admitting: Family Medicine

## 2020-05-09 ENCOUNTER — Encounter: Payer: Self-pay | Admitting: Family Medicine

## 2020-05-09 ENCOUNTER — Other Ambulatory Visit: Payer: Self-pay

## 2020-05-09 VITALS — BP 133/80 | HR 66 | Temp 97.8°F | Ht 65.0 in | Wt 155.0 lb

## 2020-05-09 DIAGNOSIS — M2550 Pain in unspecified joint: Secondary | ICD-10-CM

## 2020-05-09 MED ORDER — PREDNISONE 20 MG PO TABS: ORAL_TABLET | ORAL | 0 refills | Status: DC

## 2020-05-09 NOTE — Progress Notes (Signed)
BP 133/80   Pulse 66   Temp 97.8 F (36.6 C)   Ht 5\' 5"  (1.651 m)   Wt 155 lb (70.3 kg)   LMP 05/01/2020 (Approximate)   SpO2 98%   BMI 25.79 kg/m    Subjective:   Patient ID: Sarah Fox, female    DOB: 07/10/1966, 54 y.o.   MRN: [id number]  HPI: Sarah Fox is a 54 y.o. female presenting on 05/09/2020 for Bilateral hand swelling (started few days ago)   HPI Patient is coming in with bilateral hand swelling around the index finger and the hand and the middle finger and hand.  She says is been going on since yesterday but she gets this on and then she has been having wrist soreness and pain that is been going on for couple years ever since she was diagnosed with underactive thyroid.  She does work in the [employer] and uses her hands a lot but she is concerned because this pain and swelling is something that is new order for her.  Relevant past medical, surgical, family and social history reviewed and updated as indicated. Interim medical history since our last visit reviewed. Allergies and medications reviewed and updated.  Review of Systems  Constitutional: Negative for chills and fever.  Eyes: Negative for visual disturbance.  Respiratory: Negative for chest tightness and shortness of breath.   Cardiovascular: Negative for chest pain and leg swelling.  Musculoskeletal: Positive for arthralgias and joint swelling. Negative for back pain and gait problem.  Skin: Negative for rash.  Neurological: Negative for light-headedness and headaches.  Psychiatric/Behavioral: Negative for agitation and behavioral problems.  All other systems reviewed and are negative.   Per HPI unless specifically indicated above   Allergies as of 05/09/2020      Reactions   Sulfa Antibiotics Rash      Medication List       Accurate as of May 09, 2020  2:59 PM. If you have any questions, ask your nurse or doctor.        aspirin EC 81 MG tablet Take 81 mg by mouth daily.     atorvastatin 40 MG tablet Commonly known as: LIPITOR TAKE 1 TABLET BY MOUTH EVERY DAY   clopidogrel 75 MG tablet Commonly known as: PLAVIX Take 1 tablet (75 mg total) by mouth daily.   escitalopram 20 MG tablet Commonly known as: LEXAPRO TAKE 1 TABLET BY MOUTH EVERY DAY   levothyroxine 75 MCG tablet Commonly known as: SYNTHROID TAKE 1 TABLET BY MOUTH EVERY DAY   metoprolol succinate 25 MG 24 hr tablet Commonly known as: TOPROL-XL Take 1 tablet (25 mg total) by mouth daily.   nitroGLYCERIN 0.4 MG SL tablet Commonly known as: Nitrostat Place 1 tablet (0.4 mg total) under the tongue every 5 (five) minutes as needed for chest pain.   pantoprazole 40 MG tablet Commonly known as: PROTONIX Take 1 tablet (40 mg total) by mouth daily.   predniSONE 20 MG tablet Commonly known as: DELTASONE 2 po at same time daily for 5 days Started by: Fransisca Kaufmann Leolia Vinzant, MD        Objective:   BP 133/80   Pulse 66   Temp 97.8 F (36.6 C)   Ht 5\' 5"  (1.651 m)   Wt 155 lb (70.3 kg)   LMP 05/01/2020 (Approximate)   SpO2 98%   BMI 25.79 kg/m   Wt Readings from Last 3 Encounters:  05/09/20 155 lb (70.3 kg)  02/15/20 152 lb (  68.9 kg)  02/04/20 156 lb (70.8 kg)    Physical Exam Vitals and nursing note reviewed.  Constitutional:      General: She is not in acute distress.    Appearance: She is well-developed. She is not diaphoretic.  Eyes:     Conjunctiva/sclera: Conjunctivae normal.  Musculoskeletal:     Right hand: Swelling (Swelling about second MCP joint) and tenderness present. No deformity. Normal range of motion.     Left hand: Swelling (Swelling about second MCP joint) and tenderness present. No deformity. Normal range of motion.  Skin:    General: Skin is warm and dry.     Findings: No rash.  Neurological:     Mental Status: She is alert and oriented to person, place, and time.     Coordination: Coordination normal.  Psychiatric:        Behavior: Behavior normal.        Assessment & Plan:   Problem List Items Addressed This Visit    None    Visit Diagnoses    Polyarthralgia    -  Primary   Relevant Medications   predniSONE (DELTASONE) 20 MG tablet   Other Relevant Orders   Arthritis Panel    Will do arthritis panel and follow-up in 2 weeks with PCP, will do prednisone, concerned about possible osteoarthritis versus polyarthritis versus rheumatoid arthritis.  We will do panel for blood work.  Follow up plan: Return in about 2 weeks (around 05/23/2020), or if symptoms worsen or fail to improve, for Follow-up hand and wrist pain and swelling..  Counseling provided for all of the vaccine components Orders Placed This Encounter  Procedures  . Arthritis Panel    Arville Care, MD [facility] 05/09/2020, 2:59 PM

## 2020-05-10 LAB — ARTHRITIS PANEL
Basophils Absolute: 0.1 10*3/uL (ref 0.0–0.2)
Basos: 1 %
EOS (ABSOLUTE): 0.7 10*3/uL — ABNORMAL HIGH (ref 0.0–0.4)
Eos: 8 %
Hematocrit: 37.6 % (ref 34.0–46.6)
Hemoglobin: 12.8 g/dL (ref 11.1–15.9)
Immature Grans (Abs): 0 10*3/uL (ref 0.0–0.1)
Immature Granulocytes: 0 %
Lymphocytes Absolute: 2.7 10*3/uL (ref 0.7–3.1)
Lymphs: 30 %
MCH: 31.2 pg (ref 26.6–33.0)
MCHC: 34 g/dL (ref 31.5–35.7)
MCV: 92 fL (ref 79–97)
Monocytes Absolute: 0.6 10*3/uL (ref 0.1–0.9)
Monocytes: 7 %
Neutrophils Absolute: 4.8 10*3/uL (ref 1.4–7.0)
Neutrophils: 54 %
Platelets: 333 10*3/uL (ref 150–450)
RBC: 4.1 x10E6/uL (ref 3.77–5.28)
RDW: 13 % (ref 11.7–15.4)
Rheumatoid fact SerPl-aCnc: <10 IU/mL (ref 0.0–13.9)
Sed Rate: 3 mm/hr (ref 0–40)
Uric Acid: 4 mg/dL (ref 3.0–7.2)
WBC: 8.8 10*3/uL (ref 3.4–10.8)

## 2020-05-11 ENCOUNTER — Other Ambulatory Visit: Payer: Self-pay | Admitting: Family

## 2020-05-14 ENCOUNTER — Other Ambulatory Visit: Payer: Self-pay | Admitting: Family

## 2020-06-04 ENCOUNTER — Other Ambulatory Visit: Payer: Self-pay | Admitting: Family

## 2020-06-12 ENCOUNTER — Encounter: Payer: Self-pay | Admitting: Family

## 2020-06-12 ENCOUNTER — Ambulatory Visit (INDEPENDENT_AMBULATORY_CARE_PROVIDER_SITE_OTHER): Payer: Federal, State, Local not specified - PPO | Admitting: Family

## 2020-06-12 DIAGNOSIS — E039 Hypothyroidism, unspecified: Secondary | ICD-10-CM

## 2020-06-12 DIAGNOSIS — M1612 Unilateral primary osteoarthritis, left hip: Secondary | ICD-10-CM | POA: Diagnosis not present

## 2020-06-12 DIAGNOSIS — K219 Gastro-esophageal reflux disease without esophagitis: Secondary | ICD-10-CM | POA: Diagnosis not present

## 2020-06-12 DIAGNOSIS — F411 Generalized anxiety disorder: Secondary | ICD-10-CM

## 2020-06-12 DIAGNOSIS — Z1159 Encounter for screening for other viral diseases: Secondary | ICD-10-CM

## 2020-06-12 DIAGNOSIS — I2510 Atherosclerotic heart disease of native coronary artery without angina pectoris: Secondary | ICD-10-CM | POA: Diagnosis not present

## 2020-06-12 DIAGNOSIS — I2583 Coronary atherosclerosis due to lipid rich plaque: Secondary | ICD-10-CM

## 2020-06-12 DIAGNOSIS — F321 Major depressive disorder, single episode, moderate: Secondary | ICD-10-CM

## 2020-06-12 DIAGNOSIS — E785 Hyperlipidemia, unspecified: Secondary | ICD-10-CM

## 2020-06-12 DIAGNOSIS — Z114 Encounter for screening for human immunodeficiency virus [HIV]: Secondary | ICD-10-CM

## 2020-06-12 MED ORDER — ESCITALOPRAM OXALATE 20 MG PO TABS: 20.0000 mg | ORAL_TABLET | Freq: Every day | ORAL | 2 refills | Status: DC

## 2020-06-12 NOTE — Progress Notes (Signed)
Virtual Visit via telephone Note Due to COVID-19 pandemic this visit was conducted virtually. This visit type was conducted due to national recommendations for restrictions regarding the COVID-19 Pandemic (e.g. social distancing, sheltering in place) in an effort to limit this patient's exposure and mitigate transmission in our community. All issues noted in this document were discussed and addressed.  A physical exam was not performed with this format.  I connected with Sarah Fox on 06/12/20 at 2:54 pm by telephone and verified that I am speaking with the correct person using two identifiers. Sarah Fox is currently located at friends house and no one is currently with her during visit. The provider, Evelina Dun, FNP is located in their office at time of visit.  I discussed the limitations, risks, security and privacy concerns of performing an evaluation and management service by telephone and the availability of in person appointments. I also discussed with the patient that there may be a patient responsible charge related to this service. The patient expressed understanding and agreed to proceed.   History and Present Illness:  PT calls  the office today for chronic follow up. She is followed by Cardiologists every 3 months for CAD and STEMI in 2018.  Gastroesophageal Reflux She complains of belching and heartburn. This is a chronic problem. The current episode started more than 1 year ago. The problem occurs occasionally. Associated symptoms include fatigue.  Thyroid Problem Presents for follow-up visit. Symptoms include dry skin and fatigue. Patient reports no constipation or depressed mood. The symptoms have been stable. Her past medical history is significant for hyperlipidemia.  Depression        This is a chronic problem.  The current episode started more than 1 year ago.   The onset quality is gradual.   The problem occurs intermittently.  Associated symptoms include  fatigue, irritable and decreased interest.  Associated symptoms include no helplessness and no hopelessness.  Past treatments include SSRIs - Selective serotonin reuptake inhibitors.  Compliance with treatment is good.  Past medical history includes thyroid problem and anxiety.   Arthritis Presents for follow-up visit. She complains of pain and stiffness. Affected locations include the left hip, right hip, left shoulder and right shoulder. Her pain is at a severity of 0/10. Associated symptoms include fatigue.  Anxiety Presents for follow-up visit. Symptoms include irritability and malaise. Patient reports no depressed mood. Symptoms occur occasionally. The severity of symptoms is moderate. The quality of sleep is good.    Hyperlipidemia This is a chronic problem. The current episode started more than 1 year ago. The problem is controlled. Recent lipid tests were reviewed and are normal. Current antihyperlipidemic treatment includes statins. The current treatment provides moderate improvement of lipids. Risk factors for coronary artery disease include dyslipidemia, hypertension and a sedentary lifestyle.      Review of Systems  Constitutional: Positive for fatigue and irritability.  Gastrointestinal: Positive for heartburn. Negative for constipation.  Musculoskeletal: Positive for arthritis and stiffness.  Psychiatric/Behavioral: Positive for depression.  All other systems reviewed and are negative.    Observations/Objective: No SOB or distress noted   Assessment and Plan: Sarah Fox comes in today with chief complaint of No chief complaint on file.   Diagnosis and orders addressed:  1. CAD in native artery - CBC with Differential/Platelet - CMP14+EGFR  2. Coronary artery disease due to lipid rich plaque - CBC with Differential/Platelet - CMP14+EGFR  3. Gastroesophageal reflux disease, unspecified whether esophagitis present - CBC with  Differential/Platelet -  CMP14+EGFR  4. Hypothyroidism, unspecified type - CBC with Differential/Platelet - CMP14+EGFR - TSH  5. Primary osteoarthritis of left hip - CBC with Differential/Platelet - CMP14+EGFR  6. Hyperlipidemia LDL goal <70 - CBC with Differential/Platelet - CMP14+EGFR  7. GAD (generalized anxiety disorder) - CBC with Differential/Platelet - CMP14+EGFR - escitalopram (LEXAPRO) 20 MG tablet; Take 1 tablet (20 mg total) by mouth daily. (Needs to be seen before next refill)  Dispense: 90 tablet; Refill: 2  8. Depression, major, single episode, moderate (HCC) - CBC with Differential/Platelet - CMP14+EGFR - escitalopram (LEXAPRO) 20 MG tablet; Take 1 tablet (20 mg total) by mouth daily. (Needs to be seen before next refill)  Dispense: 90 tablet; Refill: 2   Labs pending Health Maintenance reviewed Diet and exercise encouraged  Follow up plan: 6 months       I discussed the assessment and treatment plan with the patient. The patient was provided an opportunity to ask questions and all were answered. The patient agreed with the plan and demonstrated an understanding of the instructions.   The patient was advised to call back or seek an in-person evaluation if the symptoms worsen or if the condition fails to improve as anticipated.  The above assessment and management plan was discussed with the patient. The patient verbalized understanding of and has agreed to the management plan. Patient is aware to call the clinic if symptoms persist or worsen. Patient is aware when to return to the clinic for a follow-up visit. Patient educated on when it is appropriate to go to the emergency department.   Time call ended:  3:08 pm  I provided 14 minutes of non-face-to-face time during this encounter.    Evelina Dun, FNP

## 2020-06-20 ENCOUNTER — Other Ambulatory Visit: Payer: Federal, State, Local not specified - PPO

## 2020-06-20 DIAGNOSIS — I2583 Coronary atherosclerosis due to lipid rich plaque: Secondary | ICD-10-CM | POA: Diagnosis not present

## 2020-06-20 DIAGNOSIS — K219 Gastro-esophageal reflux disease without esophagitis: Secondary | ICD-10-CM | POA: Diagnosis not present

## 2020-06-20 DIAGNOSIS — E039 Hypothyroidism, unspecified: Secondary | ICD-10-CM | POA: Diagnosis not present

## 2020-06-20 DIAGNOSIS — I2510 Atherosclerotic heart disease of native coronary artery without angina pectoris: Secondary | ICD-10-CM | POA: Diagnosis not present

## 2020-06-21 LAB — TSH: TSH: 4.65 u[IU]/mL — ABNORMAL HIGH (ref 0.450–4.500)

## 2020-06-21 LAB — CBC WITH DIFFERENTIAL/PLATELET
Basophils Absolute: 0.1 10*3/uL (ref 0.0–0.2)
Basos: 1 %
EOS (ABSOLUTE): 0.8 10*3/uL — ABNORMAL HIGH (ref 0.0–0.4)
Eos: 8 %
Hematocrit: 37.6 % (ref 34.0–46.6)
Hemoglobin: 13 g/dL (ref 11.1–15.9)
Immature Grans (Abs): 0 10*3/uL (ref 0.0–0.1)
Immature Granulocytes: 0 %
Lymphocytes Absolute: 3 10*3/uL (ref 0.7–3.1)
Lymphs: 33 %
MCH: 31.3 pg (ref 26.6–33.0)
MCHC: 34.6 g/dL (ref 31.5–35.7)
MCV: 90 fL (ref 79–97)
Monocytes Absolute: 0.5 10*3/uL (ref 0.1–0.9)
Monocytes: 6 %
Neutrophils Absolute: 4.8 10*3/uL (ref 1.4–7.0)
Neutrophils: 52 %
Platelets: 310 10*3/uL (ref 150–450)
RBC: 4.16 x10E6/uL (ref 3.77–5.28)
RDW: 13.1 % (ref 11.7–15.4)
WBC: 9.1 10*3/uL (ref 3.4–10.8)

## 2020-06-21 LAB — CMP14+EGFR
ALT: 17 IU/L (ref 0–32)
AST: 15 IU/L (ref 0–40)
Albumin/Globulin Ratio: 1.7 (ref 1.2–2.2)
Albumin: 4.5 g/dL (ref 3.8–4.9)
Alkaline Phosphatase: 80 IU/L (ref 48–121)
BUN/Creatinine Ratio: 15 (ref 9–23)
BUN: 12 mg/dL (ref 6–24)
Bilirubin Total: 0.3 mg/dL (ref 0.0–1.2)
CO2: 23 mmol/L (ref 20–29)
Calcium: 9.3 mg/dL (ref 8.7–10.2)
Chloride: 103 mmol/L (ref 96–106)
Creatinine, Ser: 0.82 mg/dL (ref 0.57–1.00)
GFR calc Af Amer: 96 mL/min/{1.73_m2} (ref >59)
GFR calc non Af Amer: 83 mL/min/{1.73_m2} (ref >59)
Globulin, Total: 2.7 g/dL (ref 1.5–4.5)
Glucose: 98 mg/dL (ref 65–99)
Potassium: 3.8 mmol/L (ref 3.5–5.2)
Sodium: 138 mmol/L (ref 134–144)
Total Protein: 7.2 g/dL (ref 6.0–8.5)

## 2020-06-21 LAB — HEPATITIS C ANTIBODY: Hep C Virus Ab: <0.1 s/co ratio (ref 0.0–0.9)

## 2020-06-21 LAB — HIV ANTIBODY (ROUTINE TESTING W REFLEX): HIV Screen 4th Generation wRfx: NONREACTIVE

## 2020-06-23 ENCOUNTER — Other Ambulatory Visit: Payer: Self-pay | Admitting: Family

## 2020-06-23 ENCOUNTER — Other Ambulatory Visit: Payer: Self-pay | Admitting: *Deleted

## 2020-06-23 DIAGNOSIS — E039 Hypothyroidism, unspecified: Secondary | ICD-10-CM

## 2020-06-23 MED ORDER — LEVOTHYROXINE SODIUM 88 MCG PO TABS: 88.0000 ug | ORAL_TABLET | Freq: Every day | ORAL | 11 refills | Status: DC

## 2020-07-07 ENCOUNTER — Other Ambulatory Visit: Payer: Self-pay | Admitting: Family

## 2020-07-07 DIAGNOSIS — Z1231 Encounter for screening mammogram for malignant neoplasm of breast: Secondary | ICD-10-CM

## 2020-07-21 ENCOUNTER — Other Ambulatory Visit: Payer: Self-pay

## 2020-07-21 ENCOUNTER — Ambulatory Visit
Admission: RE | Admit: 2020-07-21 | Discharge: 2020-07-21 | Disposition: A | Discharge status: Home / Self Care | Payer: Federal, State, Local not specified - PPO | Source: Ambulatory Visit | Attending: Family | Admitting: Family

## 2020-07-21 DIAGNOSIS — Z1231 Encounter for screening mammogram for malignant neoplasm of breast: Secondary | ICD-10-CM

## 2020-09-01 NOTE — Progress Notes (Signed)
Cardiology Office Note:    Date:  09/03/2020   ID:  Sarah Fox [birth year], MRN [id number]  PCP:  Sarah Spencer, FNP  Cardiologist:  Sarah Noe, MD   Referring MD: Sarah Spencer, FNP   Chief Complaint  Patient presents with  . Coronary Artery Disease    History of Present Illness:    Sarah Fox is a 54 y.o. female with a hx of CAD, Ant. MI accompanied by V fib and successful resuscitation and acute systolic HF-with recover to HFpEF. RFM with lipid and tobacco management.  No cardiac complaints.  Her job requires significant activity and she has no limitations or symptoms related to work.  She denies angina, palpitations, syncope, and shortness of breath.  She had intermittent chest pain lasting seconds and a localized left parasternal region about 2 weeks ago.  It happened off and on up to 30 minutes, resolved, and has not recurred.  She is an antivaccine person.  Has not received COVID-19 vaccination and never had vaccinations of other types.   Past Medical History:  Diagnosis Date  . Acute ST elevation myocardial infarction (STEMI) of anterior wall (HCC)   . Allergy   . CAD in native artery   . Cardiac arrest with ventricular fibrillation (HCC) 10/2017   with acute MI and hypokalemia  . Coronary artery disease   . GERD (gastroesophageal reflux disease)   . Ischemic cardiomyopathy 10/2017  . Myocardial infarction acute (HCC) 10/2017  . Thyroid disease    hypo  . VF (ventricular fibrillation) [facility])     Past Surgical History:  Procedure Laterality Date  . CORONARY STENT INTERVENTION N/A 11/29/2017   Procedure: CORONARY STENT INTERVENTION;  Surgeon: Sarah Records, MD;  Location: [facility] INVASIVE CV LAB;  Service: Cardiovascular;  Laterality: N/A;  . CORONARY/GRAFT ACUTE MI REVASCULARIZATION N/A 11/29/2017   Procedure: Coronary/Graft Acute MI Revascularization;  Surgeon: Sarah Records, MD;  Location: [facility] INVASIVE CV LAB;  Service: Cardiovascular;   Laterality: N/A;  . LEFT HEART CATH AND CORONARY ANGIOGRAPHY N/A 11/29/2017   Procedure: LEFT HEART CATH AND CORONARY ANGIOGRAPHY;  Surgeon: Sarah Records, MD;  Location: [facility] INVASIVE CV LAB;  Service: Cardiovascular;  Laterality: N/A;  . LUMBAR DISC SURGERY     L5-S1    Current Medications: Current Meds  Medication Sig  . atorvastatin (LIPITOR) 40 MG tablet TAKE 1 TABLET BY MOUTH EVERY DAY  . clopidogrel (PLAVIX) 75 MG tablet Take 1 tablet (75 mg total) by mouth daily.  Marland Kitchen escitalopram (LEXAPRO) 20 MG tablet Take 1 tablet (20 mg total) by mouth daily. (Needs to be seen before next refill)  . levothyroxine (SYNTHROID) 88 MCG tablet Take 1 tablet (88 mcg total) by mouth daily.  . metoprolol succinate (TOPROL-XL) 25 MG 24 hr tablet Take 1 tablet (25 mg total) by mouth daily.  . nitroGLYCERIN (NITROSTAT) 0.4 MG SL tablet Place 1 tablet (0.4 mg total) under the tongue every 5 (five) minutes as needed for chest pain.  . pantoprazole (PROTONIX) 40 MG tablet Take 1 tablet (40 mg total) by mouth daily.  . [DISCONTINUED] aspirin EC 81 MG tablet Take 81 mg by mouth daily.     Allergies:   Sulfa antibiotics   Social History   Socioeconomic History  . Marital status: Married    Spouse name: Not on file  . Number of children: 1  . Years of education: Not on file  . Highest education level: Not on file  Occupational History  . Occupation: Retail banker: [employer]  Tobacco Use  . Smoking status: Former Smoker    Packs/day: 0.50    Years: 20.00    Pack years: 10.00    Quit date: 11/28/2017    Years since quitting: 2.7  . Smokeless tobacco: Never Used  Vaping Use  . Vaping Use: Never used  Substance and Sexual Activity  . Alcohol use: Yes    Alcohol/week: 28.0 standard drinks    Types: 28 Cans of beer per week    Comment: 4 12 oz beers nightly  . Drug use: No  . Sexual activity: Yes    Comment: ptp states she cannot be pregnant, but not using anything to not get prenant    Other Topics Concern  . Not on file  Social History Narrative   Married with one child.   Social Determinants of Health   Financial Resource Strain:   . Difficulty of Paying Living Expenses: Not on file  Food Insecurity:   . Worried About Programme researcher, broadcasting/film/video in the Last Year: Not on file  . Ran Out of Food in the Last Year: Not on file  Transportation Needs:   . Lack of Transportation (Medical): Not on file  . Lack of Transportation (Non-Medical): Not on file  Physical Activity:   . Days of Exercise per Week: Not on file  . Minutes of Exercise per Session: Not on file  Stress:   . Feeling of Stress : Not on file  Social Connections:   . Frequency of Communication with Friends and Family: Not on file  . Frequency of Social Gatherings with Friends and Family: Not on file  . Attends Religious Services: Not on file  . Active Member of Clubs or Organizations: Not on file  . Attends Banker Meetings: Not on file  . Marital Status: Not on file     Family History: The patient's family history includes Breast cancer (age of onset: 61) in her mother and sister; Heart disease in her brother. There is no history of Colon cancer, Esophageal cancer, Rectal cancer, or Stomach cancer.  ROS:   Please see the history of present illness.    No problems other than as noted above.  All other systems reviewed and are negative.  EKGs/Labs/Other Studies Reviewed:    The following studies were reviewed today: No new imaging data  2D Doppler echocardiogram 2019 Study Conclusions   - Left ventricle: The cavity size was normal. Wall thickness was  normal. Systolic function was mildly to moderately reduced. The  estimated ejection fraction was in the range of 40% to 45%. There  is akinesis of the basal-midanteroseptal myocardium. Doppler  parameters are consistent with abnormal left ventricular  relaxation (grade 1 diastolic dysfunction). GLS: -17.8%  - Atrial septum:  The septum bowed from left to right, consistent  with increased left atrial pressure.   Myocardial perfusion imaging 2021 Study Highlights    Nuclear stress EF: 52%.  There was no ST segment deviation noted during stress.  This is a low risk study.  The left ventricular ejection fraction is mildly decreased (45-54%).   Mild thinning of apex and basal septum ? Breast/extra cardiac attenuation No ischemia EF low normal 52% with no RWMA Low risk study    EKG:  EKG a new tracing is not performed  Recent Labs: 06/20/2020: ALT 17; BUN 12; Creatinine, Ser 0.82; Hemoglobin 13.0; Platelets 310; Potassium 3.8; Sodium 138;  TSH 4.650  Recent Lipid Panel    Component Value Date/Time   CHOL 109 01/22/2020 1512   TRIG 126 01/22/2020 1512   HDL 46 01/22/2020 1512   CHOLHDL 2.4 01/22/2020 1512   CHOLHDL 2.3 11/30/2017 0012   VLDL 12 11/30/2017 0012   LDLCALC 41 01/22/2020 1512    Physical Exam:    VS:  BP 130/70   Pulse 65   Ht 5\' 5"  (1.651 m)   Wt 155 lb (70.3 kg)   SpO2 99%   BMI 25.79 kg/m     Wt Readings from Last 3 Encounters:  09/03/20 155 lb (70.3 kg)  05/09/20 155 lb (70.3 kg)  02/15/20 152 lb (68.9 kg)     GEN: Healthy-appearing. No acute distress HEENT: Normal NECK: No JVD. LYMPHATICS: No lymphadenopathy CARDIAC:  RRR without murmur, gallop, or edema. VASCULAR:  Normal Pulses. No bruits. RESPIRATORY:  Clear to auscultation without rales, wheezing or rhonchi  ABDOMEN: Soft, non-tender, non-distended, No pulsatile mass, MUSCULOSKELETAL: No deformity  SKIN: Warm and dry NEUROLOGIC:  Alert and oriented x 3 PSYCHIATRIC:  Normal affect   ASSESSMENT:    1. Coronary artery disease involving native coronary artery of native heart without angina pectoris   2. Chronic combined systolic and diastolic heart failure (HCC)   3. Hyperlipidemia LDL goal <70   4. Educated about COVID-19 virus infection    PLAN:    In order of problems listed above:  1. This is  discussed and is understood.  She has not smoked since her myocardial infarction.  Stop aspirin. 2. She has recent document low normal LV systolic function in the 50 to 55% range.  There are no symptoms to suggest volume overload/CHF. 3. Continue intensity statin therapy with Lipitor 40 mg/day.  Most recent lipid panel was January 2021 where LDL was 41. 4. She is against vaccinations.  She has not received COVID-19.  I strongly suggested high level medication and that she should reconsider and keep an open mind.  Overall education and awareness concerning primary/secondary risk prevention was discussed in detail: LDL less than 70, hemoglobin A1c less than 7, blood pressure target less than 130/80 mmHg, >150 minutes of moderate aerobic activity per week, avoidance of smoking, weight control (via diet and exercise), and continued surveillance/management of/for obstructive sleep apnea.    Medication Adjustments/Labs and Tests Ordered: Current medicines are reviewed at length with the patient today.  Concerns regarding medicines are outlined above.  No orders of the defined types were placed in this encounter.  No orders of the defined types were placed in this encounter.   Patient Instructions  Medication Instructions:  1) DISCONTINUE Aspirin  *If you need a refill on your cardiac medications before your next appointment, please call your pharmacy*   Lab Work: None If you have labs (blood work) drawn today and your tests are completely normal, you will receive your results only by: February 2021 MyChart Message (if you have MyChart) OR . A paper copy in the mail If you have any lab test that is abnormal or we need to change your treatment, we will call you to review the results.   Testing/Procedures: None   Follow-Up: At [facility], you and your health needs are our priority.  As part of our continuing mission to provide you with exceptional heart care, we have created designated Provider  Care Teams.  These Care Teams include your primary Cardiologist (physician) and Advanced Practice Providers (APPs -  Physician Assistants and Nurse Practitioners)  who all work together to provide you with the care you need, when you need it.  We recommend signing up for the patient portal called "MyChart".  Sign up information is provided on this After Visit Summary.  MyChart is used to connect with patients for Virtual Visits (Telemedicine).  Patients are able to view lab/test results, encounter notes, upcoming appointments, etc.  Non-urgent messages can be sent to your provider as well.   To learn more about what you can do with MyChart, go to [email or web address].    Your next appointment:   1 year(s)  The format for your next appointment:   In Person  Provider:   You may see Sarah Noe, MD or one of the following Advanced Practice Providers on your designated Care Team:    Norma Fredrickson, NP  Nada Boozer, NP  Georgie Chard, NP    Other Instructions      Signed, Sarah Noe, MD  09/03/2020 9:06 AM    [facility]

## 2020-09-03 ENCOUNTER — Other Ambulatory Visit: Payer: Self-pay

## 2020-09-03 ENCOUNTER — Encounter: Payer: Self-pay | Admitting: Interventional Cardiology

## 2020-09-03 ENCOUNTER — Ambulatory Visit: Payer: Federal, State, Local not specified - PPO | Admitting: Interventional Cardiology

## 2020-09-03 VITALS — BP 130/70 | HR 65 | Ht 65.0 in | Wt 155.0 lb

## 2020-09-03 DIAGNOSIS — I5042 Chronic combined systolic (congestive) and diastolic (congestive) heart failure: Secondary | ICD-10-CM

## 2020-09-03 DIAGNOSIS — Z7189 Other specified counseling: Secondary | ICD-10-CM

## 2020-09-03 DIAGNOSIS — I2510 Atherosclerotic heart disease of native coronary artery without angina pectoris: Secondary | ICD-10-CM

## 2020-09-03 DIAGNOSIS — E785 Hyperlipidemia, unspecified: Secondary | ICD-10-CM

## 2020-09-03 NOTE — Patient Instructions (Addendum)
Medication Instructions:  1) DISCONTINUE Aspirin  *If you need a refill on your cardiac medications before your next appointment, please call your pharmacy*   Lab Work: None If you have labs (blood work) drawn today and your tests are completely normal, you will receive your results only by: Marland Kitchen MyChart Message (if you have MyChart) OR . A paper copy in the mail If you have any lab test that is abnormal or we need to change your treatment, we will call you to review the results.   Testing/Procedures: None   Follow-Up: At [facility], you and your health needs are our priority.  As part of our continuing mission to provide you with exceptional heart care, we have created designated Provider Care Teams.  These Care Teams include your primary Cardiologist (physician) and Advanced Practice Providers (APPs -  Physician Assistants and Nurse Practitioners) who all work together to provide you with the care you need, when you need it.  We recommend signing up for the patient portal called "MyChart".  Sign up information is provided on this After Visit Summary.  MyChart is used to connect with patients for Virtual Visits (Telemedicine).  Patients are able to view lab/test results, encounter notes, upcoming appointments, etc.  Non-urgent messages can be sent to your provider as well.   To learn more about what you can do with MyChart, go to [email or web address].    Your next appointment:   1 year(s)  The format for your next appointment:   In Person  Provider:   You may see Lesleigh Noe, MD or one of the following Advanced Practice Providers on your designated Care Team:    Norma Fredrickson, NP  Nada Boozer, NP  Georgie Chard, NP    Other Instructions

## 2020-10-14 ENCOUNTER — Other Ambulatory Visit: Payer: Self-pay | Admitting: Interventional Cardiology

## 2020-11-12 ENCOUNTER — Other Ambulatory Visit: Payer: Self-pay | Admitting: Interventional Cardiology

## 2020-11-17 ENCOUNTER — Other Ambulatory Visit: Payer: Self-pay

## 2020-11-17 ENCOUNTER — Encounter: Payer: Self-pay | Admitting: Family

## 2020-11-17 ENCOUNTER — Ambulatory Visit: Payer: Federal, State, Local not specified - PPO | Admitting: Family

## 2020-11-17 VITALS — BP 131/83 | HR 74 | Temp 97.3°F | Ht 65.0 in | Wt 156.4 lb

## 2020-11-17 DIAGNOSIS — H9312 Tinnitus, left ear: Secondary | ICD-10-CM | POA: Diagnosis not present

## 2020-11-17 DIAGNOSIS — E039 Hypothyroidism, unspecified: Secondary | ICD-10-CM | POA: Diagnosis not present

## 2020-11-17 MED ORDER — FLUTICASONE PROPIONATE 50 MCG/ACT NA SUSP: 2.0000 | Freq: Every day | NASAL | 6 refills | Status: DC

## 2020-11-17 MED ORDER — CETIRIZINE HCL 10 MG PO TABS: 10.0000 mg | ORAL_TABLET | Freq: Every day | ORAL | 11 refills | Status: DC

## 2020-11-17 NOTE — Progress Notes (Signed)
Subjective:    Patient ID: Sarah Fox, female    DOB: 1966-05-15, 54 y.o.   MRN: [id number]  Chief Complaint  Patient presents with  . Ear Fullness    left ear swishing     Ear Fullness  There is pain in the left ear. This is a new problem. The current episode started more than 1 month ago. The problem occurs constantly. The problem has been gradually worsening. There has been no fever. The pain is at a severity of 0/10. The patient is experiencing no pain. Pertinent negatives include no coughing, diarrhea, hearing loss, rhinorrhea or sore throat. Associated symptoms comments: Swishing sound that comes and goes. She has tried nothing for the symptoms. The treatment provided no relief.  Thyroid Problem Presents for follow-up visit. Patient reports no constipation, diarrhea, fatigue or hoarse voice. The symptoms have been stable.      Review of Systems  Constitutional: Negative for fatigue.  HENT: Negative for hearing loss, hoarse voice, rhinorrhea and sore throat.   Respiratory: Negative for cough.   Gastrointestinal: Negative for constipation and diarrhea.  All other systems reviewed and are negative.      Objective:   Physical Exam Vitals reviewed.  Constitutional:      General: She is not in acute distress.    Appearance: She is well-developed.  HENT:     Head: Normocephalic and atraumatic.     Right Ear: Tympanic membrane normal.     Left Ear: No tenderness. A middle ear effusion is present. Tympanic membrane is not perforated or erythematous.  Eyes:     Pupils: Pupils are equal, round, and reactive to light.  Neck:     Thyroid: No thyromegaly.  Cardiovascular:     Rate and Rhythm: Normal rate and regular rhythm.     Heart sounds: Normal heart sounds. No murmur heard.   Pulmonary:     Effort: Pulmonary effort is normal. No respiratory distress.     Breath sounds: Normal breath sounds. No wheezing.  Abdominal:     General: Bowel sounds are normal. There is no  distension.     Palpations: Abdomen is soft.     Tenderness: There is no abdominal tenderness.  Musculoskeletal:        General: No tenderness. Normal range of motion.     Cervical back: Normal range of motion and neck supple.  Skin:    General: Skin is warm and dry.  Neurological:     Mental Status: She is alert and oriented to person, place, and time.     Cranial Nerves: No cranial nerve deficit.     Deep Tendon Reflexes: Reflexes are normal and symmetric.  Psychiatric:        Behavior: Behavior normal.        Thought Content: Thought content normal.        Judgment: Judgment normal.       BP 131/83   Pulse 74   Temp (!) 97.3 F (36.3 C) (Temporal)   Ht 5\' 5"  (1.651 m)   Wt 156 lb 6.4 oz (70.9 kg)   SpO2 (!) 74%   BMI 26.03 kg/m      Assessment & Plan:  Sarah Fox comes in today with chief complaint of Ear Fullness (left ear swishing )   Diagnosis and orders addressed:  1. Tinnitus of left ear Start zyrtec and flonase  Will do referral to ENT  Discuss there is no treatment for tinnitus  - cetirizine (ZYRTEC)  10 MG tablet; Take 1 tablet (10 mg total) by mouth daily.  Dispense: 30 tablet; Refill: 11 - fluticasone (FLONASE) 50 MCG/ACT nasal spray; Place 2 sprays into both nostrils daily.  Dispense: 16 g; Refill: 6 - Ambulatory referral to ENT  2. Hypothyroidism, unspecified type - TSH    Jannifer Rodney, FNP

## 2020-11-17 NOTE — Patient Instructions (Signed)

## 2020-11-18 LAB — TSH: TSH: 2.04 u[IU]/mL (ref 0.450–4.500)

## 2020-11-25 ENCOUNTER — Telehealth: Payer: Self-pay

## 2020-11-25 NOTE — Telephone Encounter (Signed)
**Note De-Identified [name] Obfuscation** I started a Pantoprazole PA through covermymeds: Key: [id number]

## 2020-12-15 DIAGNOSIS — H9312 Tinnitus, left ear: Secondary | ICD-10-CM | POA: Diagnosis not present

## 2020-12-15 DIAGNOSIS — H903 Sensorineural hearing loss, bilateral: Secondary | ICD-10-CM | POA: Diagnosis not present

## 2020-12-22 ENCOUNTER — Other Ambulatory Visit: Payer: Self-pay | Admitting: Otolaryngology

## 2020-12-22 ENCOUNTER — Other Ambulatory Visit (HOSPITAL_COMMUNITY): Payer: Self-pay | Admitting: Otolaryngology

## 2020-12-22 DIAGNOSIS — H918X9 Other specified hearing loss, unspecified ear: Secondary | ICD-10-CM

## 2020-12-22 DIAGNOSIS — H93A2 Pulsatile tinnitus, left ear: Secondary | ICD-10-CM

## 2021-01-02 ENCOUNTER — Ambulatory Visit (HOSPITAL_COMMUNITY)
Admission: RE | Admit: 2021-01-02 | Discharge: 2021-01-02 | Disposition: A | Discharge status: Home / Self Care | Payer: Federal, State, Local not specified - PPO | Source: Ambulatory Visit | Attending: Otolaryngology | Admitting: Otolaryngology

## 2021-01-02 ENCOUNTER — Other Ambulatory Visit: Payer: Self-pay

## 2021-01-02 DIAGNOSIS — Q283 Other malformations of cerebral vessels: Secondary | ICD-10-CM | POA: Diagnosis not present

## 2021-01-02 DIAGNOSIS — H918X9 Other specified hearing loss, unspecified ear: Secondary | ICD-10-CM | POA: Diagnosis not present

## 2021-01-02 DIAGNOSIS — H93A2 Pulsatile tinnitus, left ear: Secondary | ICD-10-CM | POA: Insufficient documentation

## 2021-01-02 DIAGNOSIS — J341 Cyst and mucocele of nose and nasal sinus: Secondary | ICD-10-CM | POA: Diagnosis not present

## 2021-01-02 MED ORDER — GADOBUTROL 1 MMOL/ML IV SOLN
7.0000 mL | Freq: Once | INTRAVENOUS | Status: AC | PRN
  Administered 2021-01-02: 7 mL via INTRAVENOUS

## 2021-01-13 ENCOUNTER — Other Ambulatory Visit (HOSPITAL_COMMUNITY): Payer: Self-pay | Admitting: Otolaryngology

## 2021-01-13 ENCOUNTER — Other Ambulatory Visit: Payer: Self-pay | Admitting: Otolaryngology

## 2021-01-13 DIAGNOSIS — H93A2 Pulsatile tinnitus, left ear: Secondary | ICD-10-CM

## 2021-01-16 NOTE — Telephone Encounter (Signed)
**Note De-Identified Jacory Kamel Obfuscation** I have re-submitted this request through covermymeds. Key: [id number]

## 2021-01-16 NOTE — Telephone Encounter (Signed)
**Note De-Identified Aamiyah Derrick Obfuscation** Message from Covermymeds: Duffy Bruce Key: [id number] - PA Case ID: [id number] Outcome: Approved today Your PA request has been approved. Additional information will be provided in the approval communication. (Message 1145) Drug: Pantoprazole Sodium 40MG  dr tablets Form [employer] Electronic PA Form [phone] NCPDP)  I have notified [facility] of this approval.

## 2021-01-16 NOTE — Telephone Encounter (Signed)
I called [facility] concerning pt's medication pantoprazole 40 mg tablet. Pharmacist stated that pt's insurance will only pay for 90 tablets once every 365 days. This medication, pt takes daily. Could you please check on the prior auth that was done? Thanks

## 2021-02-04 ENCOUNTER — Other Ambulatory Visit: Payer: Self-pay

## 2021-02-04 ENCOUNTER — Ambulatory Visit (HOSPITAL_COMMUNITY)
Admission: RE | Admit: 2021-02-04 | Discharge: 2021-02-04 | Disposition: A | Discharge status: Home / Self Care | Payer: Federal, State, Local not specified - PPO | Source: Ambulatory Visit | Attending: Otolaryngology | Admitting: Otolaryngology

## 2021-02-04 DIAGNOSIS — H93A2 Pulsatile tinnitus, left ear: Secondary | ICD-10-CM

## 2021-02-04 MED ORDER — IOHEXOL 350 MG/ML SOLN
100.0000 mL | Freq: Once | INTRAVENOUS | Status: AC | PRN
  Administered 2021-02-04: 75 mL via INTRAVENOUS

## 2021-02-06 LAB — POCT I-STAT CREATININE: Creatinine, Ser: 0.9 mg/dL (ref 0.44–1.00)

## 2021-02-14 ENCOUNTER — Observation Stay (HOSPITAL_COMMUNITY)
Admission: EM | Admit: 2021-02-14 | Discharge: 2021-02-16 | Disposition: A | Discharge status: Home / Self Care | Payer: Federal, State, Local not specified - PPO | Attending: Cardiology | Admitting: Cardiology

## 2021-02-14 ENCOUNTER — Other Ambulatory Visit: Payer: Self-pay

## 2021-02-14 ENCOUNTER — Encounter (HOSPITAL_COMMUNITY): Payer: Self-pay | Admitting: Emergency Medicine

## 2021-02-14 ENCOUNTER — Emergency Department (HOSPITAL_COMMUNITY): Payer: Federal, State, Local not specified - PPO

## 2021-02-14 DIAGNOSIS — R0789 Other chest pain: Secondary | ICD-10-CM | POA: Diagnosis not present

## 2021-02-14 DIAGNOSIS — Z7902 Long term (current) use of antithrombotics/antiplatelets: Secondary | ICD-10-CM | POA: Insufficient documentation

## 2021-02-14 DIAGNOSIS — I25119 Atherosclerotic heart disease of native coronary artery with unspecified angina pectoris: Secondary | ICD-10-CM | POA: Insufficient documentation

## 2021-02-14 DIAGNOSIS — I208 Other forms of angina pectoris: Secondary | ICD-10-CM | POA: Diagnosis not present

## 2021-02-14 DIAGNOSIS — Z7982 Long term (current) use of aspirin: Secondary | ICD-10-CM | POA: Insufficient documentation

## 2021-02-14 DIAGNOSIS — I5042 Chronic combined systolic (congestive) and diastolic (congestive) heart failure: Secondary | ICD-10-CM | POA: Diagnosis not present

## 2021-02-14 DIAGNOSIS — E039 Hypothyroidism, unspecified: Secondary | ICD-10-CM | POA: Diagnosis not present

## 2021-02-14 DIAGNOSIS — I513 Intracardiac thrombosis, not elsewhere classified: Secondary | ICD-10-CM | POA: Diagnosis not present

## 2021-02-14 DIAGNOSIS — R42 Dizziness and giddiness: Secondary | ICD-10-CM | POA: Diagnosis present

## 2021-02-14 DIAGNOSIS — Z20822 Contact with and (suspected) exposure to covid-19: Secondary | ICD-10-CM | POA: Diagnosis not present

## 2021-02-14 DIAGNOSIS — R55 Syncope and collapse: Secondary | ICD-10-CM

## 2021-02-14 DIAGNOSIS — R079 Chest pain, unspecified: Secondary | ICD-10-CM

## 2021-02-14 DIAGNOSIS — E876 Hypokalemia: Secondary | ICD-10-CM | POA: Insufficient documentation

## 2021-02-14 DIAGNOSIS — I2510 Atherosclerotic heart disease of native coronary artery without angina pectoris: Secondary | ICD-10-CM | POA: Insufficient documentation

## 2021-02-14 DIAGNOSIS — E785 Hyperlipidemia, unspecified: Secondary | ICD-10-CM | POA: Insufficient documentation

## 2021-02-14 DIAGNOSIS — Z79899 Other long term (current) drug therapy: Secondary | ICD-10-CM | POA: Diagnosis not present

## 2021-02-14 DIAGNOSIS — I255 Ischemic cardiomyopathy: Secondary | ICD-10-CM | POA: Diagnosis not present

## 2021-02-14 DIAGNOSIS — I252 Old myocardial infarction: Secondary | ICD-10-CM | POA: Insufficient documentation

## 2021-02-14 DIAGNOSIS — G5621 Lesion of ulnar nerve, right upper limb: Secondary | ICD-10-CM | POA: Diagnosis not present

## 2021-02-14 DIAGNOSIS — Z87891 Personal history of nicotine dependence: Secondary | ICD-10-CM | POA: Insufficient documentation

## 2021-02-14 DIAGNOSIS — Z8674 Personal history of sudden cardiac arrest: Secondary | ICD-10-CM | POA: Insufficient documentation

## 2021-02-14 LAB — URINALYSIS, ROUTINE W REFLEX MICROSCOPIC
Bilirubin Urine: NEGATIVE
Glucose, UA: NEGATIVE mg/dL
Hgb urine dipstick: NEGATIVE
Ketones, ur: NEGATIVE mg/dL
Leukocytes,Ua: NEGATIVE
Nitrite: NEGATIVE
Protein, ur: NEGATIVE mg/dL
Specific Gravity, Urine: 1.02 (ref 1.005–1.030)
pH: 5 (ref 5.0–8.0)

## 2021-02-14 LAB — RESP PANEL BY RT-PCR (FLU A&B, COVID) ARPGX2
Influenza A by PCR: NEGATIVE
Influenza B by PCR: NEGATIVE
SARS Coronavirus 2 by RT PCR: NEGATIVE

## 2021-02-14 LAB — I-STAT BETA HCG BLOOD, ED (MC, WL, AP ONLY): I-stat hCG, quantitative: <5 m[IU]/mL (ref <5)

## 2021-02-14 LAB — CREATININE, SERUM
Creatinine, Ser: 0.8 mg/dL (ref 0.44–1.00)
GFR, Estimated: >60 mL/min (ref >60)

## 2021-02-14 LAB — CBC
HCT: 37.6 % (ref 36.0–46.0)
HCT: 38.6 % (ref 36.0–46.0)
Hemoglobin: 12.8 g/dL (ref 12.0–15.0)
Hemoglobin: 13.1 g/dL (ref 12.0–15.0)
MCH: 31.4 pg (ref 26.0–34.0)
MCH: 31 pg (ref 26.0–34.0)
MCHC: 34 g/dL (ref 30.0–36.0)
MCHC: 33.9 g/dL (ref 30.0–36.0)
MCV: 92.4 fL (ref 80.0–100.0)
MCV: 91.5 fL (ref 80.0–100.0)
Platelets: 267 10*3/uL (ref 150–400)
Platelets: 265 10*3/uL (ref 150–400)
RBC: 4.07 MIL/uL (ref 3.87–5.11)
RBC: 4.22 MIL/uL (ref 3.87–5.11)
RDW: 13.4 % (ref 11.5–15.5)
RDW: 13.4 % (ref 11.5–15.5)
WBC: 6.9 10*3/uL (ref 4.0–10.5)
WBC: 8.9 10*3/uL (ref 4.0–10.5)
nRBC: 0 % (ref 0.0–0.2)
nRBC: 0 % (ref 0.0–0.2)

## 2021-02-14 LAB — BASIC METABOLIC PANEL
Anion gap: 11 (ref 5–15)
BUN: 9 mg/dL (ref 6–20)
CO2: 24 mmol/L (ref 22–32)
Calcium: 8.9 mg/dL (ref 8.9–10.3)
Chloride: 106 mmol/L (ref 98–111)
Creatinine, Ser: 0.85 mg/dL (ref 0.44–1.00)
GFR, Estimated: >60 mL/min (ref >60)
Glucose, Bld: 94 mg/dL (ref 70–99)
Potassium: 3.8 mmol/L (ref 3.5–5.1)
Sodium: 141 mmol/L (ref 135–145)

## 2021-02-14 LAB — TROPONIN I (HIGH SENSITIVITY)
Troponin I (High Sensitivity): 4 ng/L (ref <18)
Troponin I (High Sensitivity): 3 ng/L (ref <18)

## 2021-02-14 MED ORDER — ONDANSETRON HCL 4 MG/2ML IJ SOLN: 4.0000 mg | Freq: Four times a day (QID) | INTRAMUSCULAR | Status: DC | PRN

## 2021-02-14 MED ORDER — PANTOPRAZOLE SODIUM 40 MG PO TBEC: 40.0000 mg | DELAYED_RELEASE_TABLET | Freq: Every day | ORAL | Status: DC

## 2021-02-14 MED ORDER — PANTOPRAZOLE SODIUM 40 MG PO TBEC
40.0000 mg | DELAYED_RELEASE_TABLET | Freq: Every day | ORAL | Status: DC
  Administered 2021-02-15 – 2021-02-16 (×2): 40 mg via ORAL
  Filled 2021-02-14 (×2): qty 1

## 2021-02-14 MED ORDER — SODIUM CHLORIDE 0.9% FLUSH: 3.0000 mL | INTRAVENOUS | Status: DC | PRN

## 2021-02-14 MED ORDER — ALPRAZOLAM 0.25 MG PO TABS: 0.2500 mg | ORAL_TABLET | Freq: Two times a day (BID) | ORAL | Status: DC | PRN

## 2021-02-14 MED ORDER — METOPROLOL SUCCINATE ER 25 MG PO TB24
25.0000 mg | ORAL_TABLET | Freq: Every day | ORAL | Status: DC
  Administered 2021-02-15 – 2021-02-16 (×2): 25 mg via ORAL
  Filled 2021-02-14 (×2): qty 1

## 2021-02-14 MED ORDER — ASPIRIN EC 81 MG PO TBEC
81.0000 mg | DELAYED_RELEASE_TABLET | Freq: Every day | ORAL | Status: DC
  Administered 2021-02-15 – 2021-02-16 (×2): 81 mg via ORAL
  Filled 2021-02-14 (×2): qty 1

## 2021-02-14 MED ORDER — ATORVASTATIN CALCIUM 40 MG PO TABS
40.0000 mg | ORAL_TABLET | Freq: Every day | ORAL | Status: DC
  Administered 2021-02-15 – 2021-02-16 (×2): 40 mg via ORAL
  Filled 2021-02-14 (×2): qty 1

## 2021-02-14 MED ORDER — ATORVASTATIN CALCIUM 40 MG PO TABS: 40.0000 mg | ORAL_TABLET | Freq: Every day | ORAL | Status: DC

## 2021-02-14 MED ORDER — ESCITALOPRAM OXALATE 10 MG PO TABS
40.0000 mg | ORAL_TABLET | Freq: Every day | ORAL | Status: DC
  Administered 2021-02-15 – 2021-02-16 (×2): 40 mg via ORAL
  Filled 2021-02-14 (×2): qty 4

## 2021-02-14 MED ORDER — SODIUM CHLORIDE 0.9 % IV SOLN: 250.0000 mL | INTRAVENOUS | Status: DC | PRN

## 2021-02-14 MED ORDER — NITROGLYCERIN 0.4 MG SL SUBL: 0.4000 mg | SUBLINGUAL_TABLET | SUBLINGUAL | Status: DC | PRN

## 2021-02-14 MED ORDER — ACETAMINOPHEN 325 MG PO TABS: 650.0000 mg | ORAL_TABLET | ORAL | Status: DC | PRN

## 2021-02-14 MED ORDER — SODIUM CHLORIDE 0.9% FLUSH
3.0000 mL | Freq: Two times a day (BID) | INTRAVENOUS | Status: DC
  Administered 2021-02-14 – 2021-02-16 (×4): 3 mL via INTRAVENOUS

## 2021-02-14 MED ORDER — METOPROLOL SUCCINATE ER 25 MG PO TB24: 25.0000 mg | ORAL_TABLET | Freq: Every day | ORAL | Status: DC

## 2021-02-14 MED ORDER — LEVOTHYROXINE SODIUM 88 MCG PO TABS: 88.0000 ug | ORAL_TABLET | Freq: Every day | ORAL | Status: DC

## 2021-02-14 MED ORDER — FLUTICASONE PROPIONATE 50 MCG/ACT NA SUSP
2.0000 | Freq: Every day | NASAL | Status: DC
  Administered 2021-02-14 – 2021-02-16 (×2): 2 via NASAL
  Filled 2021-02-14: qty 16

## 2021-02-14 MED ORDER — ESCITALOPRAM OXALATE 10 MG PO TABS: 20.0000 mg | ORAL_TABLET | Freq: Every day | ORAL | Status: DC

## 2021-02-14 MED ORDER — ZOLPIDEM TARTRATE 5 MG PO TABS: 5.0000 mg | ORAL_TABLET | Freq: Every evening | ORAL | Status: DC | PRN

## 2021-02-14 MED ORDER — LEVOTHYROXINE SODIUM 88 MCG PO TABS
88.0000 ug | ORAL_TABLET | Freq: Every day | ORAL | Status: DC
  Administered 2021-02-15 – 2021-02-16 (×2): 88 ug via ORAL
  Filled 2021-02-14 (×3): qty 1

## 2021-02-14 MED ORDER — ENOXAPARIN SODIUM 40 MG/0.4ML ~~LOC~~ SOLN
40.0000 mg | SUBCUTANEOUS | Status: DC
  Administered 2021-02-14: 40 mg via SUBCUTANEOUS
  Filled 2021-02-14: qty 0.4

## 2021-02-14 MED ORDER — CLOPIDOGREL BISULFATE 75 MG PO TABS: 75.0000 mg | ORAL_TABLET | Freq: Every day | ORAL | Status: DC

## 2021-02-14 MED ORDER — CLOPIDOGREL BISULFATE 75 MG PO TABS
75.0000 mg | ORAL_TABLET | Freq: Every day | ORAL | Status: DC
  Administered 2021-02-15: 75 mg via ORAL
  Filled 2021-02-14: qty 1

## 2021-02-14 MED ORDER — ASPIRIN EC 81 MG PO TBEC: 81.0000 mg | DELAYED_RELEASE_TABLET | Freq: Every day | ORAL | Status: DC

## 2021-02-14 NOTE — ED Provider Notes (Signed)
[facility] EMERGENCY DEPARTMENT Provider Note   CSN: [id number] Arrival date & time: 02/14/21  0720     History Chief Complaint  Patient presents with  . Arm Pain  . Chest Pain  . Dizziness  . Nausea    Sarah Fox is a 55 y.o. female.  Patient presents to ER chief complaint of lightheadedness dizziness left arm tightness.  She states she was concerned that her symptoms were similar to her prior MI several years ago.  However, this time, she only had minimal chest pain per patient.  Symptoms lasted 4-5 minutes, and resolved after Nitroglycerin and aspirin and subsequently has no symptoms at this time.  She had a cardiac event several years ago with V. fib arrest, had 1 cardiac stent placed.  She had a normal Lexiscan with her cardiologist last year.  Continues on aspirin and Plavix.     HPI: A 55 year old patient with a history of peripheral artery disease presents for evaluation of chest pain. Initial onset of pain was approximately 1-3 hours ago. The patient's chest pain is not worse with exertion and is relieved by nitroglycerin. The patient complains of nausea and reports some diaphoresis. The patient's chest pain is not middle- or left-sided, is not well-localized, is not described as heaviness/pressure/tightness, is not sharp and does radiate to the arms/jaw/neck. The patient has no history of stroke, has not smoked in the past 90 days, denies any history of treated diabetes, has no relevant family history of coronary artery disease (first degree relative at less than age 40), is not hypertensive, has no history of hypercholesterolemia and does not have an elevated BMI (>=30).   Past Medical History:  Diagnosis Date  . Acute ST elevation myocardial infarction (STEMI) of anterior wall (HCC)   . Allergy   . CAD in native artery   . Cardiac arrest with ventricular fibrillation (HCC) 10/2017   with acute MI and hypokalemia  . Coronary artery disease   . GERD  (gastroesophageal reflux disease)   . Ischemic cardiomyopathy 10/2017  . Myocardial infarction acute (HCC) 10/2017  . Thyroid disease    hypo  . VF (ventricular fibrillation) [facility])     Patient Active Problem List   Diagnosis Date Noted  . Depression, major, single episode, moderate (HCC) 06/12/2020  . Osteoarthritis 05/11/2019  . GAD (generalized anxiety disorder) 10/30/2018  . Hypothyroidism 08/29/2018  . Chest pain at rest 08/24/2018  . Chronic combined systolic and diastolic heart failure (HCC) 08/14/2018  . Hyperlipidemia LDL goal <70 08/14/2018  . GERD (gastroesophageal reflux disease)   . Coronary artery disease   . Allergy   . Acute ST elevation myocardial infarction (STEMI) of anterior wall (HCC)   . CAD in native artery   . VF (ventricular fibrillation) (HCC)   . Myocardial infarction acute (HCC) 10/27/2017  . Ischemic cardiomyopathy 10/27/2017  . Cardiac arrest with ventricular fibrillation (HCC) 10/27/2017  . Left shoulder pain 09/29/2017  . Right elbow pain 01/24/2017  . Ulcer-like dyspepsia 04/04/2012  . Tobacco use 04/04/2012    Past Surgical History:  Procedure Laterality Date  . CORONARY STENT INTERVENTION N/A 11/29/2017   Procedure: CORONARY STENT INTERVENTION;  Surgeon: Lyn Records, MD;  Location: [facility] INVASIVE CV LAB;  Service: Cardiovascular;  Laterality: N/A;  . CORONARY/GRAFT ACUTE MI REVASCULARIZATION N/A 11/29/2017   Procedure: Coronary/Graft Acute MI Revascularization;  Surgeon: Lyn Records, MD;  Location: [facility] INVASIVE CV LAB;  Service: Cardiovascular;  Laterality: N/A;  . LEFT HEART  CATH AND CORONARY ANGIOGRAPHY N/A 11/29/2017   Procedure: LEFT HEART CATH AND CORONARY ANGIOGRAPHY;  Surgeon: Lyn Records, MD;  Location: [facility] INVASIVE CV LAB;  Service: Cardiovascular;  Laterality: N/A;  . LUMBAR DISC SURGERY     L5-S1     OB History    Gravida  1   Para      Term      Preterm      AB      Living  1     SAB      IAB      Ectopic       Multiple      Live Births              Family History  Problem Relation Age of Onset  . Breast cancer Mother 55  . Breast cancer Sister 76  . Heart disease Brother   . Colon cancer Neg Hx   . Esophageal cancer Neg Hx   . Rectal cancer Neg Hx   . Stomach cancer Neg Hx     Social History   Tobacco Use  . Smoking status: Former Smoker    Packs/day: 0.50    Years: 20.00    Pack years: 10.00    Quit date: 11/28/2017    Years since quitting: 3.2  . Smokeless tobacco: Never Used  Vaping Use  . Vaping Use: Never used  Substance Use Topics  . Alcohol use: Yes    Alcohol/week: 28.0 standard drinks    Types: 28 Cans of beer per week    Comment: 4 12 oz beers nightly  . Drug use: No    Home Medications Prior to Admission medications   Medication Sig Start Date End Date Taking? Authorizing Provider  aspirin EC 81 MG tablet Take 81 mg by mouth daily. Swallow whole.   Yes [provider]  atorvastatin (LIPITOR) 40 MG tablet TAKE 1 TABLET BY MOUTH EVERY DAY 11/12/20  Yes Lyn Records, MD  clopidogrel (PLAVIX) 75 MG tablet TAKE 1 TABLET BY MOUTH EVERY DAY 11/12/20  Yes Lyn Records, MD  escitalopram (LEXAPRO) 20 MG tablet Take 1 tablet (20 mg total) by mouth daily. (Needs to be seen before next refill) 06/12/20  Yes Hawks, Christy A, FNP  fluticasone (FLONASE) 50 MCG/ACT nasal spray Place 2 sprays into both nostrils daily. 11/17/20  Yes Hawks, Christy A, FNP  levothyroxine (SYNTHROID) 88 MCG tablet Take 1 tablet (88 mcg total) by mouth daily. 06/23/20 06/23/21 Yes Hawks, Christy A, FNP  metoprolol succinate (TOPROL-XL) 25 MG 24 hr tablet Take 1 tablet (25 mg total) by mouth daily. 03/11/20  Yes Lyn Records, MD  nitroGLYCERIN (NITROSTAT) 0.4 MG SL tablet Place 1 tablet (0.4 mg total) under the tongue every 5 (five) minutes as needed for chest pain. 02/15/20  Yes Lyn Records, MD  pantoprazole (PROTONIX) 40 MG tablet TAKE 1 TABLET BY MOUTH EVERY DAY 10/15/20  Yes Lyn Records, MD  cetirizine (ZYRTEC) 10 MG tablet Take 1 tablet (10 mg total) by mouth daily. Patient not taking: Reported on 02/14/2021 11/17/20   Junie Spencer, FNP    Allergies    Sulfa antibiotics  Review of Systems   Review of Systems  Constitutional: Negative for fever.  HENT: Negative for ear pain.   Eyes: Negative for pain.  Respiratory: Negative for cough.   Gastrointestinal: Negative for abdominal pain.  Genitourinary: Negative for flank pain.  Musculoskeletal: Negative for back pain.  Skin:  Negative for rash.  Neurological: Negative for headaches.    Physical Exam Updated Vital Signs BP 124/70   Pulse 63   Temp 97.8 F (36.6 C) (Oral)   Resp 15   LMP 02/07/2021   SpO2 96%   Physical Exam Constitutional:      General: She is not in acute distress.    Appearance: Normal appearance.  HENT:     Head: Normocephalic.     Nose: Nose normal.  Eyes:     Extraocular Movements: Extraocular movements intact.  Cardiovascular:     Rate and Rhythm: Normal rate.  Pulmonary:     Effort: Pulmonary effort is normal.  Musculoskeletal:        General: Normal range of motion.     Cervical back: Normal range of motion.     Right lower leg: No edema.     Left lower leg: No edema.  Neurological:     General: No focal deficit present.     Mental Status: She is alert. Mental status is at baseline.     ED Results / Procedures / Treatments   Labs (all labs ordered are listed, but only abnormal results are displayed) Labs Reviewed  BASIC METABOLIC PANEL  CBC  URINALYSIS, ROUTINE W REFLEX MICROSCOPIC  I-STAT BETA HCG BLOOD, ED ([facility], [facility], [facility] ONLY)  TROPONIN I (HIGH SENSITIVITY)  TROPONIN I (HIGH SENSITIVITY)    EKG EKG Interpretation  Date/Time:  Saturday February 14 2021 07:34:00 EST Ventricular Rate:  61 PR Interval:  160 QRS Duration: 78 QT Interval:  436 QTC Calculation: 438 R Axis:   28 Text Interpretation: Normal sinus rhythm Septal infarct , age undetermined  Abnormal ECG Confirmed by Norman Clay ([id number]) on 02/14/2021 7:59:20 AM   Radiology DG Chest 2 View  Result Date: 02/14/2021 CLINICAL DATA:  Chest pain. EXAM: CHEST - 2 VIEW COMPARISON:  01/27/2020 FINDINGS: The heart size and mediastinal contours are within normal limits. Both lungs are clear. The visualized skeletal structures are unremarkable. IMPRESSION: No active cardiopulmonary disease. Electronically Signed   By: Signa Kell M.D.   On: 02/14/2021 08:21    Procedures Procedures   Medications Ordered in ED Medications - No data to display  ED Course  I have reviewed the triage vital signs and the nursing notes.  Pertinent labs & imaging results that were available during my care of the patient were reviewed by me and considered in my medical decision making (see chart for details).    MDM Rules/Calculators/A&P HEAR Score: 5                        EKG shows sinus rhythm, normal rate, no ST elevations, no T wave inversions, very similar to prior EKGs in the past.  Serial troponins ordered both appear unremarkable.  Patient states that she is much more comfortable at this time with no symptoms of chest pain.  However given extensive history, case discussed with cardiology on-call.  Patient states she already took aspirin 162 mg orally prior to admission.    Final Clinical Impression(s) / ED Diagnoses Final diagnoses:  Chest pain, unspecified type    Rx / DC Orders ED Discharge Orders    None       Cheryll Cockayne, MD 02/14/21 1147

## 2021-02-14 NOTE — ED Triage Notes (Signed)
Pt. Stated I had a massive heart attack 32 years ago and Ive had symptoms of the same this morning around 630. Ive had arm pain, numbness, nausea ,some dizziness, and I felt a little pain in my chest. I took 2 NTG and 4 Baby ASA. I called EMS and they checked me out. My husband decided to bring me.

## 2021-02-14 NOTE — H&P (Addendum)
Cardiology Admission History and Physical:   Patient ID: Sarah Fox; MRN: [id number]; DOB: [birth year]   Admission date: 02/14/2021  Primary Care Provider: Junie Spencer, FNP Primary Cardiologist: Lesleigh Noe, MD 09/03/2020 Primary Electrophysiologist: None    Chief Complaint:  Chest pain  Patient Profile:   Sarah Fox is a 55 y.o. female with a history of MI w/ VF arrest 11/2017, S-CHF w/ EF 25% >> EF 40-45% 2019 echo, HLD, tob use, GERD, hypothyroid.  History of Present Illness:   Sarah Fox had chest pain that was relieved by sublingual nitroglycerin x2 and 81 mg aspirin x4.  She came to the ER and cardiology was asked to evaluate.  Sarah Fox has been doing fairly well.  She has noticed some shortness of breath with exertion, but felt that was because she had quit going to the gym.  She has not had any chest pain with exertion.  About a year ago, she had some chest pain and went to the emergency room.  She had a subsequent stress test that was low risk.  At that time, Dr. Katrinka Blazing reviewed the results, he felt that if she had recurrent symptoms, cath would be indicated.  Today, Ms. Walpole was in her usual state of health.  She was getting ready to go to work.  She had sudden onset of nausea and vomiting that was associated with diarrhea.  She felt that something was wrong.  She was very lightheaded and weak.  She was diaphoretic and a little short of breath.  These symptoms reminded her of her MI.  She took 2 sublingual nitroglycerin and 4 baby aspirin.  Her symptoms resolved.  She is currently resting comfortably.  She has not had symptoms like that before.  She is very concerned about the symptoms.  Of note, she did not have any palpitations at all during this.  She does have some dyspnea on exertion that she feels is a little worse than usual, but denies waking with lower extremity edema.  She denies orthopnea or PND.  She has not gained any weight and does not feel she has  put on any fluid.   Past Medical History:  Diagnosis Date  . Acute ST elevation myocardial infarction (STEMI) of anterior wall (HCC)   . Allergy   . CAD in native artery   . Cardiac arrest with ventricular fibrillation (HCC) 10/2017   with acute MI and hypokalemia  . Coronary artery disease   . GERD (gastroesophageal reflux disease)   . Ischemic cardiomyopathy 10/2017  . Myocardial infarction acute (HCC) 10/2017  . Thyroid disease    hypo  . VF (ventricular fibrillation) [facility])     Past Surgical History:  Procedure Laterality Date  . CORONARY STENT INTERVENTION N/A 11/29/2017   Procedure: CORONARY STENT INTERVENTION;  Surgeon: Lyn Records, MD;  Location: [facility] INVASIVE CV LAB;  Service: Cardiovascular;  Laterality: N/A;  . CORONARY/GRAFT ACUTE MI REVASCULARIZATION N/A 11/29/2017   Procedure: Coronary/Graft Acute MI Revascularization;  Surgeon: Lyn Records, MD;  Location: [facility] INVASIVE CV LAB;  Service: Cardiovascular;  Laterality: N/A;  . LEFT HEART CATH AND CORONARY ANGIOGRAPHY N/A 11/29/2017   Procedure: LEFT HEART CATH AND CORONARY ANGIOGRAPHY;  Surgeon: Lyn Records, MD;  Location: [facility] INVASIVE CV LAB;  Service: Cardiovascular;  Laterality: N/A;  . LUMBAR DISC SURGERY     L5-S1     Medications Prior to Admission: Prior to Admission medications   Medication Sig Start Date End  Date Taking? Authorizing Provider  aspirin EC 81 MG tablet Take 81 mg by mouth daily. Swallow whole.   Yes [provider]  atorvastatin (LIPITOR) 40 MG tablet TAKE 1 TABLET BY MOUTH EVERY DAY 11/12/20  Yes Lyn Records, MD  clopidogrel (PLAVIX) 75 MG tablet TAKE 1 TABLET BY MOUTH EVERY DAY 11/12/20  Yes Lyn Records, MD  escitalopram (LEXAPRO) 20 MG tablet Take 1 tablet (20 mg total) by mouth daily. (Needs to be seen before next refill) 06/12/20  Yes Hawks, Christy A, FNP  fluticasone (FLONASE) 50 MCG/ACT nasal spray Place 2 sprays into both nostrils daily. 11/17/20  Yes Hawks, Christy A, FNP   levothyroxine (SYNTHROID) 88 MCG tablet Take 1 tablet (88 mcg total) by mouth daily. 06/23/20 06/23/21 Yes Hawks, Christy A, FNP  metoprolol succinate (TOPROL-XL) 25 MG 24 hr tablet Take 1 tablet (25 mg total) by mouth daily. 03/11/20  Yes Lyn Records, MD  nitroGLYCERIN (NITROSTAT) 0.4 MG SL tablet Place 1 tablet (0.4 mg total) under the tongue every 5 (five) minutes as needed for chest pain. 02/15/20  Yes Lyn Records, MD  pantoprazole (PROTONIX) 40 MG tablet TAKE 1 TABLET BY MOUTH EVERY DAY 10/15/20  Yes Lyn Records, MD  cetirizine (ZYRTEC) 10 MG tablet Take 1 tablet (10 mg total) by mouth daily. Patient not taking: Reported on 02/14/2021 11/17/20   Junie Spencer, FNP     Allergies:    Allergies  Allergen Reactions  . Sulfa Antibiotics Rash    Social History:   Social History   Socioeconomic History  . Marital status: Married    Spouse name: Not on file  . Number of children: 1  . Years of education: Not on file  . Highest education level: Not on file  Occupational History  . Occupation: Retail banker: [employer]  Tobacco Use  . Smoking status: Former Smoker    Packs/day: 0.50    Years: 20.00    Pack years: 10.00    Quit date: 11/28/2017    Years since quitting: 3.2  . Smokeless tobacco: Never Used  Vaping Use  . Vaping Use: Never used  Substance and Sexual Activity  . Alcohol use: Yes    Alcohol/week: 28.0 standard drinks    Types: 28 Cans of beer per week    Comment: 4 12 oz beers nightly  . Drug use: No  . Sexual activity: Yes    Comment: ptp states she cannot be pregnant, but not using anything to not get prenant  Other Topics Concern  . Not on file  Social History Narrative   Married with one child.   Social Determinants of Health   Financial Resource Strain: Not on file  Food Insecurity: Not on file  Transportation Needs: Not on file  Physical Activity: Not on file  Stress: Not on file  Social Connections: Not on file  Intimate  Partner Violence: Not on file    Family History:  The patient's family history includes Breast cancer (age of onset: 73) in her mother and sister; Heart disease in her brother. There is no history of Colon cancer, Esophageal cancer, Rectal cancer, or Stomach cancer.   The patient She indicated that her mother is alive. She indicated that her father is alive. She indicated that the status of her sister is unknown. She indicated that the status of her brother is unknown. She indicated that her maternal grandmother is deceased. She indicated that her  maternal grandfather is deceased. She indicated that her paternal grandmother is deceased. She indicated that her paternal grandfather is deceased. She indicated that the status of her neg hx is unknown.    ROS:  Please see the history of present illness.  All other ROS reviewed and negative.     Physical Exam/Data:   Vitals:   02/14/21 1030 02/14/21 1100 02/14/21 1130 02/14/21 1200  BP: 124/74 124/70 121/69 (!) 144/88  Pulse: 64 63 64 64  Resp: 17 15 19 19   Temp:      TempSrc:      SpO2: 97% 96% 96% 99%   No intake or output data in the 24 hours ending 02/14/21 1231 There were no vitals filed for this visit. There is no height or weight on file to calculate BMI.  General:  Well nourished, well developed, female in no acute distress HEENT: normal Lymph: no adenopathy Neck:  JVD not elevated Endocrine:  No thryomegaly Vascular: No carotid bruits; 4/4 extremity pulses 2+ bilaterally  Cardiac:  normal S1, S2; RRR; no murmur, no rub or gallop  Lungs:  clear to auscultation bilaterally, no wheezing, rhonchi or rales  Abd: soft, nontender, no hepatomegaly  Ext: no edema Musculoskeletal:  No deformities, BUE and BLE strength normal and equal Skin: warm and dry  Neuro:  CNs 2-12 intact, no focal abnormalities noted Psych:  Normal affect    EKG:  The ECG was personally reviewed: Sinus rhythm, heart rate 61, no acute ischemic changes, no new  Q waves Telemetry: Sinus rhythm  Relevant CV Studies:  ECHO: 12/04/2018 - Left ventricle: The cavity size was normal. Wall thickness was  normal. Systolic function was mildly to moderately reduced. The  estimated ejection fraction was in the range of 40% to 45%. There  is akinesis of the basal-midanteroseptal myocardium. Doppler  parameters are consistent with abnormal left ventricular  relaxation (grade 1 diastolic dysfunction). GLS: -17.8%  - Atrial septum: The septum bowed from left to right, consistent  with increased left atrial pressure.   CATH: 11/29/2017  Acute anterior myocardial infarction due to occlusion of the proximal LAD with 99% thrombus contained obstruction with TIMI grade II flow on initial angiography.  Ventricular fibrillation requiring electrical conversion x3 in succession prior to PCI.  CPR was not necessary.  Hypokalemia of 3.0 was identified.  Successful LAD PCI following aspiration thrombectomy reducing 99% stenosis with TIMI grade II antegrade flow to 0% with TIMI grade III flow.  The 3.5 x 22 mm Onyx was postdilated to 3.75 mm at high pressure.  Normal right coronary artery.  Normal left main coronary artery.  Normal circumflex coronary artery.  Acute diastolic heart failure documented by moderate elevation in LVEDP.  RECOMMENDATIONS:   Aggrastat times 18 hours  Aspirin and Brilinta times 12 months  Risk factor modification as needed: Hemoglobin A1c, lipid panel, have been ordered.  Beta-blocker and ACE inhibitor therapy should be started once hemodynamics were stable.  Discharge at 48 hours if remains stable. Intervention       MYOVIEW: 02/04/2020  Nuclear stress EF: 52%.  There was no ST segment deviation noted during stress.  This is a low risk study.  The left ventricular ejection fraction is mildly decreased (45-54%).   Mild thinning of apex and basal septum ? Breast/extra cardiac attenuation No ischemia EF  low normal 52% with no RWMA Low risk study     Laboratory Data:  Chemistry Recent Labs  Lab 02/14/21 0740  NA 141  K  3.8  CL 106  CO2 24  GLUCOSE 94  BUN 9  CREATININE 0.85  CALCIUM 8.9  GFRNONAA >60  ANIONGAP 11    No results for input(s): PROT, ALBUMIN, AST, ALT, ALKPHOS, BILITOT in the last 168 hours. Hematology Recent Labs  Lab 02/14/21 0740  WBC 6.9  RBC 4.07  HGB 12.8  HCT 37.6  MCV 92.4  MCH 31.4  MCHC 34.0  RDW 13.4  PLT 267   Cardiac Enzymes  High Sensitivity Troponin:   Recent Labs  Lab 02/14/21 0740 02/14/21 0941  TROPONINIHS 4 3     BNPNo results for input(s): BNP, PROBNP in the last 168 hours.  DDimer No results for input(s): DDIMER in the last 168 hours. Lipids:  Lab Results  Component Value Date   CHOL 109 01/22/2020   HDL 46 01/22/2020   LDLCALC 41 01/22/2020   TRIG 126 01/22/2020   CHOLHDL 2.4 01/22/2020   INR:  Lab Results  Component Value Date   INR 1.36 11/29/2017   A1c:  Lab Results  Component Value Date   HGBA1C 5.2 08/24/2018   Thyroid:  Lab Results  Component Value Date   TSH 2.040 11/17/2020    Radiology/Studies:  DG Chest 2 View  Result Date: 02/14/2021 CLINICAL DATA:  Chest pain. EXAM: CHEST - 2 VIEW COMPARISON:  01/27/2020 FINDINGS: The heart size and mediastinal contours are within normal limits. Both lungs are clear. The visualized skeletal structures are unremarkable. IMPRESSION: No active cardiopulmonary disease. Electronically Signed   By: Signa Kellaylor  Stroud M.D.   On: 02/14/2021 08:21    Assessment and Plan:   1. ?anginal sx -Her symptoms actually began with nausea, vomiting, and diarrhea.  It may be that she had a systemic response to the GI issues that mimicked peripheral symptoms from her MI -Symptoms improved with sublingual nitroglycerin x2 and aspirin, but nitroglycerin also helps several GI problems -ECG is not acute, cardiac enzymes are negative for MI -When she had her MI, she had distinct chest  pain that she has not experienced since then -Feels she could be admitted overnight for observation, can do ischemic evaluation in a.m.  2.  Dyslipidemia -Check lipid profile, continue statin  3.  Ischemic cardiomyopathy -Her echo was almost 53 years old, repeat -No signs or symptoms of volume overload by history or exam  Principal Problem:   Atypical angina (HCC) Active Problems:   Ischemic cardiomyopathy   Hyperlipidemia LDL goal <70     For questions or updates, please contact [facility] Please consult [email or web address] for contact info under Cardiology/STEMI.    Signed, Theodore DemarkRhonda Barrett, PA-C  02/14/2021 12:31 PM    Patient seen and examined. Agree with above documentation. Ms. going is a 55 year old female with a history of anterior MI complicated by V. fib arrest in 2018. LHC on November 29, 2017 showed occlusion of proximal LAD status post PCI. Echocardiogram at that time showed LVEF 25 to 30%. Most recent echocardiogram on December 04, 2018 showed LVEF 40 to 45%. Lexiscan Myoview on February 04, 2020 showed EF 52%, no ischemia. She reports she was in her usual state of health until this morning. States that she woke up and was getting ready for work when she had the acute onset of nausea/vomiting and diaphoresis. Felt short of breath and lightheaded. States that it felt similar to her MI in 2018, with the only difference being she was not having any chest pain. Reports episode lasted under 10 minutes, resolved after she took nitroglycerin  and aspirin. Reports no chest pain during the episode. She reports 3 weeks ago she had an episode of left-sided chest pain at rest, lasted about 30 minutes. She denies any recent exertional chest pain has had some shortness of breath with exertion. In the ED, vitals notable for pulse 62, BP 125/79, SPO2 98% on room air. Labs notable for creatinine 0.85, hemoglobin 12.8, platelets 267, high-sensitivity troponin 4 >3. EKG shows normal sinus rhythm,  rate 61, no ST/T abnormalities, Q waves in V1/2. Telemetry shows sinus rhythm.  On exam, patient is alert and oriented, regular rate and rhythm, no murmurs, lungs CTAB, no LE edema or JVD.  Unclear cause of her episode this morning. She notes that it felt similar to prior MI, but notably without any chest pain.  EKG without ischemic changes and troponins unremarkable.  Given her history, recommend ruling out ischemia with Lexiscan Myoview.  Will plan for tomorrow.  Will check echocardiogram to evaluate systolic function.  Little Ishikawa, MD

## 2021-02-14 NOTE — Plan of Care (Signed)
  Problem: Education: Goal: Knowledge of General Education information will improve Description Including pain rating scale, medication(s)/side effects and non-pharmacologic comfort measures Outcome: Progressing   Problem: Health Behavior/Discharge Planning: Goal: Ability to manage health-related needs will improve Outcome: Progressing   

## 2021-02-15 ENCOUNTER — Observation Stay (HOSPITAL_BASED_OUTPATIENT_CLINIC_OR_DEPARTMENT_OTHER): Payer: Federal, State, Local not specified - PPO

## 2021-02-15 ENCOUNTER — Observation Stay (HOSPITAL_COMMUNITY): Payer: Federal, State, Local not specified - PPO

## 2021-02-15 DIAGNOSIS — R0602 Shortness of breath: Secondary | ICD-10-CM | POA: Diagnosis not present

## 2021-02-15 DIAGNOSIS — I255 Ischemic cardiomyopathy: Secondary | ICD-10-CM | POA: Diagnosis not present

## 2021-02-15 DIAGNOSIS — R55 Syncope and collapse: Secondary | ICD-10-CM | POA: Diagnosis not present

## 2021-02-15 DIAGNOSIS — R42 Dizziness and giddiness: Secondary | ICD-10-CM | POA: Diagnosis not present

## 2021-02-15 DIAGNOSIS — R079 Chest pain, unspecified: Secondary | ICD-10-CM

## 2021-02-15 DIAGNOSIS — E876 Hypokalemia: Secondary | ICD-10-CM | POA: Diagnosis not present

## 2021-02-15 DIAGNOSIS — G5621 Lesion of ulnar nerve, right upper limb: Secondary | ICD-10-CM

## 2021-02-15 DIAGNOSIS — I208 Other forms of angina pectoris: Secondary | ICD-10-CM | POA: Diagnosis not present

## 2021-02-15 LAB — COMPREHENSIVE METABOLIC PANEL
ALT: 29 U/L (ref 0–44)
AST: 21 U/L (ref 15–41)
Albumin: 3.5 g/dL (ref 3.5–5.0)
Alkaline Phosphatase: 57 U/L (ref 38–126)
Anion gap: 11 (ref 5–15)
BUN: 11 mg/dL (ref 6–20)
CO2: 25 mmol/L (ref 22–32)
Calcium: 8.9 mg/dL (ref 8.9–10.3)
Chloride: 102 mmol/L (ref 98–111)
Creatinine, Ser: 0.85 mg/dL (ref 0.44–1.00)
GFR, Estimated: >60 mL/min (ref >60)
Glucose, Bld: 94 mg/dL (ref 70–99)
Potassium: 3.2 mmol/L — ABNORMAL LOW (ref 3.5–5.1)
Sodium: 138 mmol/L (ref 135–145)
Total Bilirubin: 0.5 mg/dL (ref 0.3–1.2)
Total Protein: 6.2 g/dL — ABNORMAL LOW (ref 6.5–8.1)

## 2021-02-15 LAB — ECHOCARDIOGRAM COMPLETE
AR max vel: 1.56 cm2
AV Area VTI: 1.4 cm2
AV Area mean vel: 1.46 cm2
AV Mean grad: 4 mmHg
AV Peak grad: 7.3 mmHg
Ao pk vel: 1.35 m/s
Area-P 1/2: 3.21 cm2
Calc EF: 46.5 %
Height: 65 in
S' Lateral: 4.1 cm
Single Plane A2C EF: 49.7 %
Single Plane A4C EF: 47.6 %
Weight: 2518.54 oz

## 2021-02-15 LAB — LIPID PANEL
Cholesterol: 104 mg/dL (ref 0–200)
HDL: 38 mg/dL — ABNORMAL LOW (ref >40)
LDL Cholesterol: 34 mg/dL (ref 0–99)
Total CHOL/HDL Ratio: 2.7 RATIO
Triglycerides: 158 mg/dL — ABNORMAL HIGH (ref <150)
VLDL: 32 mg/dL (ref 0–40)

## 2021-02-15 LAB — PROTIME-INR
INR: 1 (ref 0.8–1.2)
Prothrombin Time: 13.2 seconds (ref 11.4–15.2)

## 2021-02-15 LAB — NM MYOCAR MULTI W/SPECT W/WALL MOTION / EF
Estimated workload: 1 METS
Exercise duration (min): 5 min
Exercise duration (sec): 40 s
MPHR: 168 {beats}/min
Peak HR: 93 {beats}/min
Percent HR: 55 %
Rest HR: 55 {beats}/min

## 2021-02-15 MED ORDER — REGADENOSON 0.4 MG/5ML IV SOLN
INTRAVENOUS | Status: AC
  Filled 2021-02-15: qty 5

## 2021-02-15 MED ORDER — COUMADIN BOOK
Freq: Once | Status: AC
  Filled 2021-02-15: qty 1

## 2021-02-15 MED ORDER — WARFARIN SODIUM 5 MG PO TABS
5.0000 mg | ORAL_TABLET | Freq: Once | ORAL | Status: AC
  Administered 2021-02-15: 5 mg via ORAL
  Filled 2021-02-15: qty 1

## 2021-02-15 MED ORDER — TECHNETIUM TC 99M TETROFOSMIN IV KIT
10.0000 | PACK | Freq: Once | INTRAVENOUS | Status: AC | PRN
  Administered 2021-02-15: 10 via INTRAVENOUS

## 2021-02-15 MED ORDER — PERFLUTREN LIPID MICROSPHERE
1.0000 mL | INTRAVENOUS | Status: AC | PRN
  Administered 2021-02-15: 2 mL via INTRAVENOUS
  Filled 2021-02-15: qty 10

## 2021-02-15 MED ORDER — WARFARIN - PHARMACIST DOSING INPATIENT: Freq: Every day | Status: DC

## 2021-02-15 MED ORDER — POTASSIUM CHLORIDE CRYS ER 20 MEQ PO TBCR
40.0000 meq | EXTENDED_RELEASE_TABLET | Freq: Once | ORAL | Status: AC
  Administered 2021-02-15: 40 meq via ORAL
  Filled 2021-02-15: qty 2

## 2021-02-15 MED ORDER — WARFARIN SODIUM 5 MG PO TABS: 5.0000 mg | ORAL_TABLET | Freq: Once | ORAL | Status: DC

## 2021-02-15 MED ORDER — REGADENOSON 0.4 MG/5ML IV SOLN
0.4000 mg | Freq: Once | INTRAVENOUS | Status: AC
  Administered 2021-02-15: 0.4 mg via INTRAVENOUS
  Filled 2021-02-15: qty 5

## 2021-02-15 MED ORDER — ENOXAPARIN SODIUM 80 MG/0.8ML ~~LOC~~ SOLN
70.0000 mg | Freq: Two times a day (BID) | SUBCUTANEOUS | Status: DC
  Administered 2021-02-15 – 2021-02-16 (×2): 70 mg via SUBCUTANEOUS
  Filled 2021-02-15 (×2): qty 0.8

## 2021-02-15 MED ORDER — TECHNETIUM TC 99M TETROFOSMIN IV KIT
30.0000 | PACK | Freq: Once | INTRAVENOUS | Status: AC | PRN
  Administered 2021-02-15: 30 via INTRAVENOUS

## 2021-02-15 NOTE — Progress Notes (Addendum)
Progress Note  Patient Name: Sarah Fox Date of Encounter: 02/15/2021  Primary Cardiologist:  Lesleigh Noe, MD  Subjective   Symptoms have resolved, feels well.   Inpatient Medications    Scheduled Meds: . aspirin EC  81 mg Oral Daily  . atorvastatin  40 mg Oral Daily  . clopidogrel  75 mg Oral Daily  . enoxaparin (LOVENOX) injection  40 mg Subcutaneous Q24H  . escitalopram  40 mg Oral Daily  . fluticasone  2 spray Each Nare Daily  . levothyroxine  88 mcg Oral Q0600  . metoprolol succinate  25 mg Oral Daily  . pantoprazole  40 mg Oral Daily  . regadenoson      . regadenoson  0.4 mg Intravenous Once  . sodium chloride flush  3 mL Intravenous Q12H   Continuous Infusions: . sodium chloride     PRN Meds: sodium chloride, acetaminophen, ALPRAZolam, nitroGLYCERIN, ondansetron (ZOFRAN) IV, sodium chloride flush, technetium tetrofosmin, zolpidem   Vital Signs    Vitals:   02/14/21 2249 02/15/21 0451 02/15/21 0738 02/15/21 1057  BP: (!) 111/57 117/66 124/74 128/81  Pulse: 65 67 (!) 53   Resp:  18 20   Temp: 98.2 F (36.8 C) 98.1 F (36.7 C) 97.9 F (36.6 C)   TempSrc: Oral Oral Oral   SpO2: 98% 99%    Weight:  71.4 kg    Height:       No intake or output data in the 24 hours ending 02/15/21 1107 Filed Weights   02/14/21 1715 02/15/21 0451  Weight: 70.3 kg 71.4 kg   Last Weight  Most recent update: 02/15/2021  4:52 AM   Weight  71.4 kg (157 lb 6.5 oz)           Weight change:    Telemetry    SR, rare PVCs - Personally Reviewed  ECG    SR, No acute ischemic changes - Personally Reviewed  Physical Exam   General: Well developed, well nourished, female appearing in no acute distress. Head: Normocephalic, atraumatic.  Neck: Supple without bruits, JVD not elevated. Lungs:  Resp regular and unlabored, CTA. Heart: RRR, S1, S2, no S3, S4, or murmur; no rub. Abdomen: Soft, non-tender, non-distended with normoactive bowel sounds. No hepatomegaly.  No rebound/guarding. No obvious abdominal masses. Extremities: No clubbing, cyanosis, no edema. Distal pedal pulses are 2+ bilaterally. Neuro: Alert and oriented X 3. Moves all extremities spontaneously. Psych: Normal affect.  Labs    Hematology Recent Labs  Lab 02/14/21 0740 02/14/21 1607  WBC 6.9 8.9  RBC 4.07 4.22  HGB 12.8 13.1  HCT 37.6 38.6  MCV 92.4 91.5  MCH 31.4 31.0  MCHC 34.0 33.9  RDW 13.4 13.4  PLT 267 265    Chemistry Recent Labs  Lab 02/14/21 0740 02/14/21 1607 02/15/21 0500  NA 141  --  138  K 3.8  --  3.2*  CL 106  --  102  CO2 24  --  25  GLUCOSE 94  --  94  BUN 9  --  11  CREATININE 0.85 0.80 0.85  CALCIUM 8.9  --  8.9  PROT  --   --  6.2*  ALBUMIN  --   --  3.5  AST  --   --  21  ALT  --   --  29  ALKPHOS  --   --  57  BILITOT  --   --  0.5  GFRNONAA >60 >60 >60  ANIONGAP  11  --  11     High Sensitivity Troponin:   Recent Labs  Lab 02/14/21 0740 02/14/21 0941  TROPONINIHS 4 3     Lab Results  Component Value Date   TSH 2.040 11/17/2020   Lab Results  Component Value Date   CHOL 104 02/15/2021   HDL 38 (L) 02/15/2021   LDLCALC 34 02/15/2021   TRIG 158 (H) 02/15/2021   CHOLHDL 2.7 02/15/2021     BNPNo results for input(s): BNP, PROBNP in the last 168 hours.   DDimer No results for input(s): DDIMER in the last 168 hours.   Radiology    DG Chest 2 View  Result Date: 02/14/2021 CLINICAL DATA:  Chest pain. EXAM: CHEST - 2 VIEW COMPARISON:  01/27/2020 FINDINGS: The heart size and mediastinal contours are within normal limits. Both lungs are clear. The visualized skeletal structures are unremarkable. IMPRESSION: No active cardiopulmonary disease. Electronically Signed   By: Signa Kell M.D.   On: 02/14/2021 08:21   VAS US CAROTID  Result Date: 02/15/2021 Carotid Arterial Duplex Study Indications:       Syncope. Risk Factors:      Hyperlipidemia. Comparison Study:  No prior studies. Performing Technologist: Chanda Busing  RVT  Examination Guidelines: A complete evaluation includes B-mode imaging, spectral Doppler, color Doppler, and power Doppler as needed of all accessible portions of each vessel. Bilateral testing is considered an integral part of a complete examination. Limited examinations for reoccurring indications may be performed as noted.  Right Carotid Findings: +----------+--------+--------+--------+-----------------------+--------+           PSV cm/sEDV cm/sStenosisPlaque Description     Comments +----------+--------+--------+--------+-----------------------+--------+ CCA Prox  77      15              smooth and heterogenous         +----------+--------+--------+--------+-----------------------+--------+ CCA Distal64      19              smooth and heterogenous         +----------+--------+--------+--------+-----------------------+--------+ ICA Prox  64      17              smooth and heterogenous         +----------+--------+--------+--------+-----------------------+--------+ ICA Distal124     35                                              +----------+--------+--------+--------+-----------------------+--------+ ECA       89      16                                              +----------+--------+--------+--------+-----------------------+--------+ +----------+--------+-------+--------+-------------------+           PSV cm/sEDV cmsDescribeArm Pressure (mmHG) +----------+--------+-------+--------+-------------------+ [id number]                                        +----------+--------+-------+--------+-------------------+ +---------+--------+--+--------+--+---------+ VertebralPSV cm/s35EDV cm/s10Antegrade +---------+--------+--+--------+--+---------+  Left Carotid Findings: +----------+--------+--------+--------+-----------------------+--------+           PSV cm/sEDV cm/sStenosisPlaque Description     Comments  +----------+--------+--------+--------+-----------------------+--------+ CCA Prox  86      16  smooth and heterogenous         +----------+--------+--------+--------+-----------------------+--------+ CCA Distal65      23              smooth and heterogenous         +----------+--------+--------+--------+-----------------------+--------+ ICA Prox  31      8               smooth and heterogenous         +----------+--------+--------+--------+-----------------------+--------+ ICA Distal69      34                                              +----------+--------+--------+--------+-----------------------+--------+ ECA       65      11                                              +----------+--------+--------+--------+-----------------------+--------+ +----------+--------+--------+--------+-------------------+           PSV cm/sEDV cm/sDescribeArm Pressure (mmHG) +----------+--------+--------+--------+-------------------+ [id number]                                          +----------+--------+--------+--------+-------------------+ +---------+--------+--+--------+--+---------+ VertebralPSV cm/s48EDV cm/s18Antegrade +---------+--------+--+--------+--+---------+   Summary: Right Carotid: Velocities in the right ICA are consistent with a 1-39% stenosis. Left Carotid: Velocities in the left ICA are consistent with a 1-39% stenosis. Vertebrals: Bilateral vertebral arteries demonstrate antegrade flow. *See table(s) above for measurements and observations.     Preliminary      Cardiac Studies   ECHO: Results pending  LEXISCAN MYOVIEW: Results pending  CAROTID DOPPLERS: Preliminary results are 1-39% stenosis bilaterally  Patient Profile     55 y.o. female w/ hx  MI w/ VF arrest 11/2017 s/p DES LAD, S-CHF w/ EF 25% >> EF 40-45% 2019 echo, HLD, tob use, GERD, hypothyroid, was admitted 2/19 with chest pain.  Assessment & Plan    1. ?Atypical  angina - with her MI, had associated sx and CP - yesterday, just had the associated sx, no CP - ez neg MI, ECG unchanged - f/u MV and echo results - prelim Dopplers show < 40% stenosis, recheck in 1-2 years - possible d/c if negative - unclear why this happened, may have been a vagal response to GI issues, but sx did improve w/ nitro - f/u with Dr Katrinka Blazing  ADDENDUM: Echo concerning for LV thrombus.  I do wonder if her event yesterday was a TIA, as she reported sudden onset lightheadness and loss of balance.  Currently no neuro symptoms.  Will consult neurology and order head CT.  Plan to start anticoagulation once OK from neuro perspective.    2. Hypokalemia - supplement and follow - no hx of this, not on diuretic  Principal Problem:   Atypical angina (HCC) Active Problems:   Ischemic cardiomyopathy   Hyperlipidemia LDL goal <70    Melida Quitter , PA-C 11:07 AM 02/15/2021 Pager: [phone]  Patient seen and examined.  Agree with above documentation.  On exam, patient is alert and oriented, regular rate and rhythm, no murmurs, lungs CTAB, no LE edema or JVD.  No chest pain or dyspnea.  Will f/u results of  Lexiscan Myoview and echocardiogram.  Little Ishikawa, MD

## 2021-02-15 NOTE — Progress Notes (Signed)
ANTICOAGULATION CONSULT NOTE - Initial Consult  Pharmacy Consult for Lovenox + Coumadin Indication: LV thrombus  Allergies  Allergen Reactions  . Sulfa Antibiotics Rash    Patient Measurements: Height: 5\' 5"  (165.1 cm) Weight: 71.4 kg (157 lb 6.5 oz) IBW/kg (Calculated) : 57 Heparin Dosing Weight: 71.4kg  Vital Signs: Temp: 98.8 F (37.1 C) (02/20 1613) Temp Source: Oral (02/20 1613) BP: 121/79 (02/20 1613) Pulse Rate: 53 (02/20 0738)  Labs: Recent Labs    02/14/21 0740 02/14/21 0941 02/14/21 1607 02/15/21 0500  HGB 12.8  --  13.1  --   HCT 37.6  --  38.6  --   PLT 267  --  265  --   CREATININE 0.85  --  0.80 0.85  TROPONINIHS 4 3  --   --     Estimated Creatinine Clearance: 76.8 mL/min (by C-G formula based on SCr of 0.85 mg/dL).   Medical History: Past Medical History:  Diagnosis Date  . Acute ST elevation myocardial infarction (STEMI) of anterior wall (HCC)   . Allergy   . CAD in native artery   . Cardiac arrest with ventricular fibrillation (HCC) 10/2017   with acute MI and hypokalemia  . Coronary artery disease   . GERD (gastroesophageal reflux disease)   . Ischemic cardiomyopathy 10/2017  . Myocardial infarction acute (HCC) 10/2017  . Thyroid disease    hypo  . VF (ventricular fibrillation) [facility])     Assessment: 55 yo female with newly dx LV thrombus.  Pharmacy asked to begin Coumadin with Lovenox bridge.  Baseline CBC WNL, baseline INR pending.  Est CrCl ~ 75 ml/min  Goal of Therapy:  INR 2-3 Anti-Xa level 0.6-1 units/ml 4hrs after LMWH dose given Monitor platelets by anticoagulation protocol: Yes   Plan:  Lovenox 1 mg/kg q 12 hrs (= 70 mg q 12 hrs) Coumadin 5 mg po x 1 tonight. Daily INR. Will educate pt re: Coumadin prior to discharge.  44, Reece Leader, BCCP Clinical Pharmacist  02/15/2021 4:50 PM   [facility] pharmacy phone numbers are listed on [email or web address]

## 2021-02-15 NOTE — Progress Notes (Signed)
I demonstrated and walked patient and husband through the process of administering lovenox. Patients husband wasted 0.1mg  of the medication to get to appropriate dose of 70mg . I showed him how to clean the site and he, with my instruction, gave the patient her evening dose of lovenox. He appropriately put the safety needle covering th exposed needle and threw it in the sharps container

## 2021-02-15 NOTE — Consult Note (Signed)
Neurology Consultation Reason for Consult: TIA? Referring Physician: Dr. Bjorn Pippin  CC: dizziness  History is obtained from: patient and Dr. Bjorn Pippin  HPI: Sarah Fox is a 55 y.o. female with a h/o VF arrest in the past, CAD/MI, ischemic cardiomyopathy, and other issues. Yesterday about 6:30 AM, she got up and started to walk toward the bathroom. She felt very queasy and faint/lightheaded. She experienced nausea/vomiting and also had diarrhea. She noticed that she was pale. The symptoms were quite similar to those she had with her MI several years ago, except that there was no chest pain. She decided to take 2 nitroglycerin tablets and 4 aspirin, and she sat on the couch. The symptoms dissipated rather quickly, within 5 minutes or so. She came into the ED an hour later and was admitted for cardiac workup. Today, and echocardiogram revealed a left ventricular thrombus, and neurology is consulted to see if the episode may have been a TIA of some sort. Of interest, she had an MRI in January and a CTA of the head and neck through [facility] in February of this year to evaluate pulsatile tinnitus. She's also seen ENT for this condition. No significant posterior structural or vascular pathology was identified.  ROS: A 14 point ROS was performed and is negative except as noted in the HPI, and as follows. First, she has an unusual sensory experience, almost synesthetic in quality. Touching her left eyelid or upper cheek adjacent to the left eye will cause a sound to occur in her left ear, almost like a "firecracker." This symptom began AFTER she had the imaging to assess the pulsatile tinnitus, and after she saw ENT. Second, she had an episode last week when she was taking a test for 8 hours or so at work. She developed numbness in the right 4th and 5th digits, which seemed to disappear shortly after she was done. She denies putting pressure on her right elbow, injury to the elbow/arm, using a mouse for  prolonged periods, or other inciting factors. 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   Past Medical History:  Diagnosis Date  . Acute ST elevation myocardial infarction (STEMI) of anterior wall (HCC)   . Allergy   . CAD in native artery   . Cardiac arrest with ventricular fibrillation (HCC) 10/2017   with acute MI and hypokalemia  . Coronary artery disease   . GERD (gastroesophageal reflux disease)   . Ischemic cardiomyopathy 10/2017  . Myocardial infarction acute (HCC) 10/2017  . Thyroid disease    hypo  . VF (ventricular fibrillation) (HCC)     Family History  Problem Relation Age of Onset  . Breast cancer Mother 68  . Breast cancer Sister 53  . Heart disease Brother   . Other Father   . Colon cancer Neg Hx   . Esophageal cancer Neg Hx   . Rectal cancer Neg Hx   . Stomach cancer Neg Hx     Social History:  reports that she quit smoking about 3 years ago. She has a 10.00 pack-year smoking history. She has never used smokeless tobacco. She reports current alcohol use of about 28.0 standard drinks of alcohol per week. She reports that she does not use drugs.   Exam: Current vital signs: BP 136/77   Pulse (!) 53   Temp 97.9 F (36.6 C) (Oral)   Resp 20   Ht 5\' 5"  (1.651 m)   Wt 71.4  kg   LMP 02/07/2021   SpO2 99%   BMI 26.19 kg/m  Vital signs in last 24 hours: Temp:  [97.9 F (36.6 C)-98.2 F (36.8 C)] 97.9 F (36.6 C) (02/20 0738) Pulse Rate:  [53-75] 53 (02/20 0738) Resp:  [13-20] 20 (02/20 0738) BP: (111-148)/(57-91) 136/77 (02/20 1222) SpO2:  [97 %-99 %] 99 % (02/20 0451) Weight:  [70.3 kg-71.4 kg] 71.4 kg (02/20 0451)   Physical Exam  Constitutional: Appears well-developed and well-nourished.  Psych: Affect appropriate to situation Eyes: No scleral injection HENT: No OP obstrucion MSK: no joint deformities.  Cardiovascular: Normal rate and regular rhythm.  Respiratory: Effort normal,  non-labored breathing GI: Soft.  No distension. There is no tenderness.  Skin: WDI  Neuro: Mental Status: Patient is awake, alert, oriented to person, place, month, year, and situation. Patient is able to give a clear and coherent history. No signs of aphasia or neglect. Cranial Nerves: II: Visual Fields are full. Pupils are equal, round, and reactive to light.  III,IV, VI: EOMI without ptosis or diploplia.  V: Facial sensation is symmetric to temperature and light touch. She tells me that the strange sound in her left ear is reproduced when I palpate the eyelid and skin around the left eye. VII: Facial movement is symmetric.  VIII: hearing is intact to voice and finger rub. X: Uvula elevates symmetrically XI: deferred XII: deferred Motor: Tone is normal. Bulk is normal. 5/5 strength was present in all four extremities. Interossei, finger-thumb opposition, wrist flexion/extension are all 5/5 and intact. The opponens pollicis is 5/5. Sensory: Sensation is symmetric to light touch and temperature in the arms and legs, with the exception that she has diminished sensation to temperature, light touch and vibration in the right ulnar nerve distribution. I can reproduce electrical, uncomfortable sensations by palpating the right ulnar nerve lightly at the olecranon (cubital tunnel). Deep Tendon Reflexes: 2+ and symmetric in the biceps and patellae.  Plantars: Toes are downgoing bilaterally.  Cerebellar: FNF and HKS are intact bilaterally.   I have reviewed labs in epic and the results pertinent to this consultation are: Normal CBC, BMP (except K = 3.2)  I have reviewed the images obtained:  CT head today: Normal study. Final radiology read pending.  CTA head and neck 02/04/21: Normal intracranial CT angiography. No abnormality seen to explain pulsatile tinnitus.  MRI brain with and without 01/02/21: No acute intracranial abnormality or specific findings to explain the patient's  reported pulsatile tenderness.  Echocardiogram today: LV thrombus evident, per Dr. Bjorn Pippin  Impression:  1. Likely vasovagal pre-syncope. She had all the classical symptoms. The event occurred with standing. She had nausea/vomiting and diarrhea, and noticed pallor, and diaphoresis. The dizziness resolved promptly when she sat down. 2. LV thrombus with negative head CT showing no sign of hemorrhage or other pathology to prevent anticoagulation. 3. 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 Pulsatile tinnitus; this is typically a benign phenomenon, and a reasonable workup has been accomplished. 5. A synesthesia-like sensory disturbance described as hearing a sound during tactile stimulation on the left eyelid and/or surrounding skin.  Recommendations: 1) OK to anticoagulate for LV thrombus 2) Maintain adequate hydration, correct diarrhea, correct hypokalemia, and anti-emetics prn for nausea; these measures should help prevent "triggers" for vasovagal syncope and presyncope 3) Right elbow pad during sleep x 1 month, and during any daytime activities that put pressure on/near the right elbow. 4) If the right ulnar neuropathy symptoms progress, she may  benefit from neurodiagnostic testing, a few weeks of anti-inflammatory medication such as steroids or NSAIDs, and/or ulnar nerve transposition procedure. This can certainly be deferred to outpatient workup as it's not a dangerous problem. 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 5) No further workup needed for the pulsatile tinnitus. 6) The atypical synesthesia-like sensory disturbance is quite unusual. Certainly, outpatient neurology follow up is reasonable to re-check her exam from time to time and determine if additional testing is required.  Neurology will be available as needed, should further questions arise during her stay.  Thank you.  Meredeth Ide, MD

## 2021-02-15 NOTE — Progress Notes (Signed)
Carotid artery duplex has been completed. Preliminary results can be found in CV Proc through chart review.   02/15/21 8:29 AM Olen Cordial RVT

## 2021-02-16 ENCOUNTER — Other Ambulatory Visit: Payer: Self-pay | Admitting: Cardiology

## 2021-02-16 DIAGNOSIS — R55 Syncope and collapse: Secondary | ICD-10-CM

## 2021-02-16 DIAGNOSIS — I2510 Atherosclerotic heart disease of native coronary artery without angina pectoris: Secondary | ICD-10-CM | POA: Diagnosis not present

## 2021-02-16 DIAGNOSIS — I513 Intracardiac thrombosis, not elsewhere classified: Secondary | ICD-10-CM | POA: Diagnosis not present

## 2021-02-16 DIAGNOSIS — I208 Other forms of angina pectoris: Secondary | ICD-10-CM | POA: Diagnosis not present

## 2021-02-16 LAB — BASIC METABOLIC PANEL
Anion gap: 10 (ref 5–15)
BUN: 10 mg/dL (ref 6–20)
CO2: 22 mmol/L (ref 22–32)
Calcium: 9 mg/dL (ref 8.9–10.3)
Chloride: 105 mmol/L (ref 98–111)
Creatinine, Ser: 0.75 mg/dL (ref 0.44–1.00)
GFR, Estimated: >60 mL/min (ref >60)
Glucose, Bld: 119 mg/dL — ABNORMAL HIGH (ref 70–99)
Potassium: 3.7 mmol/L (ref 3.5–5.1)
Sodium: 137 mmol/L (ref 135–145)

## 2021-02-16 LAB — CBC
HCT: 37.3 % (ref 36.0–46.0)
Hemoglobin: 12.7 g/dL (ref 12.0–15.0)
MCH: 31 pg (ref 26.0–34.0)
MCHC: 34 g/dL (ref 30.0–36.0)
MCV: 91 fL (ref 80.0–100.0)
Platelets: 265 10*3/uL (ref 150–400)
RBC: 4.1 MIL/uL (ref 3.87–5.11)
RDW: 13.4 % (ref 11.5–15.5)
WBC: 9.1 10*3/uL (ref 4.0–10.5)
nRBC: 0 % (ref 0.0–0.2)

## 2021-02-16 LAB — PROTIME-INR
INR: 1 (ref 0.8–1.2)
Prothrombin Time: 12.9 seconds (ref 11.4–15.2)

## 2021-02-16 MED ORDER — WARFARIN SODIUM 5 MG PO TABS: 5.0000 mg | ORAL_TABLET | Freq: Once | ORAL | 3 refills | Status: DC

## 2021-02-16 MED ORDER — WARFARIN SODIUM 5 MG PO TABS: 5.0000 mg | ORAL_TABLET | Freq: Once | ORAL | Status: DC

## 2021-02-16 MED ORDER — ENOXAPARIN SODIUM 80 MG/0.8ML ~~LOC~~ SOLN: 70.0000 mg | Freq: Two times a day (BID) | SUBCUTANEOUS | 0 refills | Status: DC

## 2021-02-16 MED FILL — WARFARIN SODIUM 5 MG TABLET: 5 | 30 days supply | Qty: 30 | Fill #0

## 2021-02-16 MED FILL — ENOXAPARIN SODIUM 80 MG/0.8: 80 | 14 days supply | Qty: 22 | Fill #0

## 2021-02-16 NOTE — Discharge Instructions (Signed)
Information on my medicine - Coumadin®   (Warfarin) ° °Why was Coumadin prescribed for you? °Coumadin was prescribed for you because you have a blood clot or a medical condition that can cause an increased risk of forming blood clots. Blood clots can cause serious health problems by blocking the flow of blood to the heart, lung, or brain. Coumadin can prevent harmful blood clots from forming. °As a reminder your indication for Coumadin is:   LV thrombus ° °What test will check on my response to Coumadin? °While on Coumadin (warfarin) you will need to have an INR test regularly to ensure that your dose is keeping you in the desired range. The INR (international normalized ratio) number is calculated from the result of the laboratory test called prothrombin time (PT). ° °If an INR APPOINTMENT HAS NOT ALREADY BEEN MADE FOR YOU please schedule an appointment to have this lab work done by your health care provider within 7 days. °Your INR goal is usually a number between:  2 to 3 or your provider may give you a more narrow range like 2-2.5.  Ask your health care provider during an office visit what your goal INR is. ° °What  do you need to  know  About  COUMADIN? °Take Coumadin (warfarin) exactly as prescribed by your healthcare provider about the same time each day.  DO NOT stop taking without talking to the doctor who prescribed the medication.  Stopping without other blood clot prevention medication to take the place of Coumadin may increase your risk of developing a new clot or stroke.  Get refills before you run out. ° °What do you do if you miss a dose? °If you miss a dose, take it as soon as you remember on the same day then continue your regularly scheduled regimen the next day.  Do not take two doses of Coumadin at the same time. ° °Important Safety Information °A possible side effect of Coumadin (Warfarin) is an increased risk of bleeding. You should call your healthcare provider right away if you experience  any of the following: °? Bleeding from an injury or your nose that does not stop. °? Unusual colored urine (red or dark brown) or unusual colored stools (red or black). °? Unusual bruising for unknown reasons. °? A serious fall or if you hit your head (even if there is no bleeding). ° °Some foods or medicines interact with Coumadin® (warfarin) and might alter your response to warfarin. To help avoid this: °? Eat a balanced diet, maintaining a consistent amount of Vitamin K. °? Notify your provider about major diet changes you plan to make. °? Avoid alcohol or limit your intake to 1 drink for women and 2 drinks for men per day. °(1 drink is 5 oz. wine, 12 oz. beer, or 1.5 oz. liquor.) ° °Make sure that ANY health care provider who prescribes medication for you knows that you are taking Coumadin (warfarin).  Also make sure the healthcare provider who is monitoring your Coumadin knows when you have started a new medication including herbals and non-prescription products. ° °Coumadin® (Warfarin)  Major Drug Interactions  °Increased Warfarin Effect Decreased Warfarin Effect  °Alcohol (large quantities) °Antibiotics (esp. Septra/Bactrim, Flagyl, Cipro) °Amiodarone (Cordarone) °Aspirin (ASA) °Cimetidine (Tagamet) °Megestrol (Megace) °NSAIDs (ibuprofen, naproxen, etc.) °Piroxicam (Feldene) °Propafenone (Rythmol SR) °Propranolol (Inderal) °Isoniazid (INH) °Posaconazole (Noxafil) Barbiturates (Phenobarbital) °Carbamazepine (Tegretol) °Chlordiazepoxide (Librium) °Cholestyramine (Questran) °Griseofulvin °Oral Contraceptives °Rifampin °Sucralfate (Carafate) °Vitamin K  ° °Coumadin® (Warfarin) Major Herbal Interactions  °Increased Warfarin   Effect Decreased Warfarin Effect  °Garlic °Ginseng °Ginkgo biloba Coenzyme Q10 °Green tea °St. John’s wort   ° °Coumadin® (Warfarin) FOOD Interactions  °Eat a consistent number of servings per week of foods HIGH in Vitamin K °(1 serving = ½ cup)  °Collards (cooked, or boiled & drained) °Kale  (cooked, or boiled & drained) °Mustard greens (cooked, or boiled & drained) °Parsley *serving size only = ¼ cup °Spinach (cooked, or boiled & drained) °Swiss chard (cooked, or boiled & drained) °Turnip greens (cooked, or boiled & drained)  °Eat a consistent number of servings per week of foods MEDIUM-HIGH in Vitamin K °(1 serving = 1 cup)  °Asparagus (cooked, or boiled & drained) °Broccoli (cooked, boiled & drained, or raw & chopped) °Brussel sprouts (cooked, or boiled & drained) *serving size only = ½ cup °Lettuce, raw (green leaf, endive, romaine) °Spinach, raw °Turnip greens, raw & chopped  ° °These websites have more information on Coumadin (warfarin):  www.coumadin.com; °www.ahrq.gov/consumer/coumadin.htm; ° ° ° °

## 2021-02-16 NOTE — Discharge Summary (Signed)
Discharge Summary    Patient ID: Sarah Fox MRN: [id number]; DOB: 1966/06/21  Admit date: 02/14/2021 Discharge date: 02/16/2021  PCP:  Junie Spencer, FNP   [facility]  Cardiologist:  Lesleigh Noe, MD   :[id number]}  Discharge Diagnoses    Principal Problem:   LV (left ventricular) mural thrombus Active Problems:   CAD in native artery   Ischemic cardiomyopathy   Hyperlipidemia LDL goal <70   Atypical angina (HCC)   Near syncope  Diagnostic Studies/Procedures    Echo 02/15/21:  1. Left ventricular ejection fraction, by estimation, is 45 to 50%. The  left ventricle has mildly decreased function. The left ventricle  demonstrates regional wall motion abnormalities (see scoring  diagram/findings for description). The left ventricular  internal cavity size was mildly dilated. Left ventricular diastolic  parameters were normal.  2. There is a filling defect in the LV apex on contrast images (best seen  on images 88-90), concerning for LV thrombus  3. Right ventricular systolic function is normal. The right ventricular  size is normal. Tricuspid regurgitation signal is inadequate for assessing  PA pressure.  4. The mitral valve is normal in structure. Trivial mitral valve  regurgitation.  5. The aortic valve was not well visualized. Aortic valve regurgitation  is not visualized. No aortic stenosis is present.  _____________  Vascular duplex US 02/16/21:  Right Carotid: Velocities in the right ICA are consistent with a 1-39%  stenosis.   Left Carotid: Velocities in the left ICA are consistent with a 1-39%  stenosis.   Vertebrals: Bilateral vertebral arteries demonstrate antegrade flow.   *See table(s) above for measurements and observations.    Lexiscan stress test 02/16/21:  IMPRESSION: 1. No reversible ischemia or infarction.  2. Normal left ventricular wall motion.  3. Left ventricular ejection fraction 52%. Of note,  the end-diastolic and end systolic volumes are mildly abnormal.  4. Non invasive risk stratification*: Low   History of Present Illness     Sarah Fox is a 55 y.o. 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 fib arrest in 2018. LHC on 11/29/2017 with occlusion of proximal LAD s/p PCI. Echocardiogram at that time showed LVEF 25 to 30%. Most recent echocardiogram on 12/04/2018 with LVEF 40 to 45%. Lexiscan Myoview on 2/8/2021performed in the setting of recurrent chest pain showed EF 52%, no ischemia or infarct.   Prior to admission, she reported that she was in her usual state of health. She was getting ready for work and had the acute onset of nausea/vomiting and diaphoresis with associated SOB and lightheadedness and loss of balance which was fairly reminiscent of her prior MI in 2018.althoughshe was not having any chest pain. Reported that the  episode lasted under 10 minutes and resolved after she taking nitroglycerin and aspirin. In cardiology assessment, she did report left sided chest pain approximately 3 weeks prior that lasted about 30 minutes. There were no exertional qualities.  In the ED, vitals notable for pulse 62, BP 125/79, SPO2 98% on room air. Labs notable for creatinine 0.85, hemoglobin 12.8, platelets 267, high-sensitivity troponin 4 >3. EKG shows normal sinus rhythm, rate 61, no  ST/T abnormalities, Q waves in V1/2.   Hospital Course    Myoview stress test performed 02/15/21 which showed no reversible ischemia or infarction.  Normal left ventricular wall motion, estimated at 52% with abnormal end-diastolic and end systolic volumes are mildly abnormal. Considered a low risk study. Echocardiogram performed  02/15/21 showed and LVEF 45-50% with regional wall motion abnormalities with a filling defect concerning for LV thrombus.    There was question as to whether her dizziness and lightheadedness was due to a TIA given thrombus. She had no neuro symptoms. Neurology was consulted and the patient underwent a head CT that was negative with no signs of hemorrhage. Per neurology, likely vasovagal pre-syncope.  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 If the right ulnar neuropathy symptoms progress, she may benefit from neurodiagnostic testing, a few weeks of anti-inflammatory medication such as steroids or NSAIDs, and/or ulnar nerve transposition procedure. This can certainly be deferred to outpatient workup as it's not a dangerous problem. 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  As far as anticoagulation, pharmacy was consulted for Lovenox to Coumadin bridging. Per pharmacy: Lovenox 1 mg/kg q 12 hrs (= 70 mg q 12 hrs). Coumadin 5 mg po x 1 tonight and would continue warfarin 5 mg daily at discharge. Daily INR. Will educate pt re: Coumadin prior to discharge.  Order sent for 30-day OP live monitor.   Hospital problems as below:  Near syncope:  -Reported episode of N/V and diaphoresis, along with dyspnea and near syncope.  Suspect vasovagal.  Reports felt similar to prior MI but no chest pain. -Echo shows EF 45-50%, LV apical thrombus. Discussed with neurology, head CT with no abnormalities, do not suspect TIA>>OK to anticoagulate -Lexiscan Myoview shows no ischemia -Carotid Dopplers show mild stenosis bilaterally -Plan 30 day cardiac monitor on discharge to rule out arrhythmia  LV thrombus:  -Filling defect on contrast images on echo. Started on warfarin with lovenox bridge.   Will discontinue plavix since starting coumadin  CAD:  -s/p anterior MI in 2018. On ASA/plavix.  D/c plavix as above since starting warfarin. Myoview this admission shows no ischemia  Hypokalemia:  -Unclear cause, noted on labs yesterday.   Disposition Per Dr. Bjorn Pippin:  -OK to discharge today, 02/16/21.  The patient has received instructions on lovenox injections and her husband is comfortable giving the injections. Pharmacy team has performed full Lovenox to Coumadin education. Lovenox and other discharge medications sent to [facility] pharmacy. Pt has follow up [facility] check with [facility]  02/19/21 at 1030. Will send 2 weeks worth of Lovenox syringes to [facility] pharmacy along with 5mg  Coumadin dosing. Benefits cleared with TOC SW.   Consultants: Neurology   The patient has been seen and examined by Dr. Bjorn Pippin who feels that she is stable and ready for discharge today, 02/16/21.     Did the patient have an acute coronary syndrome (MI, NSTEMI, STEMI, etc) this admission?:  No                               Did the patient have a percutaneous coronary intervention (stent / angioplasty)?:  No.      Discharge Vitals Blood pressure (!) 116/51, pulse 61, temperature 98 F (36.7 C),  temperature source Oral, resp. rate 18, height 5\' 5"  (1.651 m), weight 72.4 kg, last menstrual period 02/07/2021, SpO2 98 %.  Filed Weights   02/14/21 1715 02/15/21 0451 02/16/21 0523  Weight: 70.3 kg 71.4 kg 72.4 kg    Labs & Radiologic Studies    CBC Recent Labs    02/14/21 1607 02/16/21 0012  WBC 8.9 9.1  HGB 13.1 12.7  HCT 38.6 37.3  MCV 91.5 91.0  PLT 265 265   Basic Metabolic Panel Recent Labs    02/18/21 0500 02/16/21 0811  NA 138 137  K 3.2* 3.7  CL 102 105  CO2 25 22  GLUCOSE 94 119*  BUN 11 10  CREATININE 0.85 0.75  CALCIUM 8.9 9.0   Liver Function Tests Recent Labs    02/15/21 0500  AST 21  ALT 29  ALKPHOS 57  BILITOT 0.5  PROT 6.2*  ALBUMIN 3.5   No results for input(s): LIPASE,  AMYLASE in the last 72 hours. High Sensitivity Troponin:   Recent Labs  Lab 02/14/21 0740 02/14/21 0941  TROPONINIHS 4 3    BNP Invalid input(s): POCBNP D-Dimer No results for input(s): DDIMER in the last 72 hours. Hemoglobin A1C No results for input(s): HGBA1C in the last 72 hours. Fasting Lipid Panel Recent Labs    02/15/21 0500  CHOL 104  HDL 38*  LDLCALC 34  TRIG 02/17/21*  CHOLHDL 2.7   Thyroid Function Tests No results for input(s): TSH, T4TOTAL, T3FREE, THYROIDAB in the last 72 hours.  Invalid input(s): FREET3 _____________  CT ANGIO HEAD W OR WO CONTRAST  Result Date: 02/04/2021 CLINICAL DATA:  Pulsatile tinnitus on the left EXAM: CT ANGIOGRAPHY HEAD TECHNIQUE: Multidetector CT imaging of the head was performed using the standard protocol during bolus administration of intravenous contrast. Multiplanar CT image reconstructions and MIPs were obtained to evaluate the vascular anatomy. CONTRAST:  52mL OMNIPAQUE IOHEXOL 350 MG/ML SOLN COMPARISON:  MRI 01/02/2021 FINDINGS: Head CT Brain: The brain shows a normal appearance without evidence of malformation, atrophy, old or acute small or large vessel infarction, mass lesion, hemorrhage, hydrocephalus or extra-axial collection. Vascular: No hyperdense vessel. No evidence of atherosclerotic calcification. Skull: Normal.  No traumatic finding.  No focal bone lesion. Sinuses/Orbits: Sinuses are clear. Orbits appear normal. Mastoids are clear. Other: None significant CTA HEAD Anterior circulation: Both internal carotid arteries are patent through the skull base and siphon regions. No siphon stenosis or atherosclerotic calcification. The anterior and middle cerebral vessels are normal without proximal stenosis, aneurysm or vascular malformation. Posterior circulation: Both vertebral arteries are patent through the foramen magnum to the basilar. No basilar stenosis. Posterior circulation branch vessels are normal. Similarly, no sign of aneurysm  or vascular malformation the posterior circulation. Venous sinuses: Normal appearance.  No evidence of dural AV fistula. Anatomic variants: None IMPRESSION: Normal intracranial CT angiography. No abnormality seen to explain pulsatile tinnitus. Electronically Signed   By: 03/02/2021 M.D.   On: 02/04/2021 21:30   DG Chest 2 View  Result Date: 02/14/2021 CLINICAL DATA:  Chest pain. EXAM: CHEST - 2 VIEW COMPARISON:  01/27/2020 FINDINGS: The heart size and mediastinal contours are within normal limits. Both lungs are clear. The visualized skeletal structures are unremarkable. IMPRESSION: No active cardiopulmonary disease. Electronically Signed   By: 01/29/2020 M.D.   On: 02/14/2021 08:21   CT HEAD WO CONTRAST  Result Date: 02/15/2021 CLINICAL DATA:  Dizziness with lightheadedness. EXAM: CT HEAD WITHOUT CONTRAST TECHNIQUE: Contiguous axial images were obtained from  the base of the skull through the vertex without intravenous contrast. COMPARISON:  CTA head 02/04/2021.  Head CT 09/10/2017. FINDINGS: Brain: There is no evidence for acute hemorrhage, hydrocephalus, mass lesion, or abnormal extra-axial fluid collection. No definite CT evidence for acute infarction. Vascular: No hyperdense vessel or unexpected calcification. Skull: No evidence for fracture. No worrisome lytic or sclerotic lesion. Sinuses/Orbits: Polypoid mucosal thickening noted in the maxillary sinuses with chronic mucosal disease noted left sphenoid sinus. Mastoid air cells are clear. Visualized portions of the globes and intraorbital fat are unremarkable. Other: None. IMPRESSION: 1. No acute intracranial abnormality. 2. Chronic paranasal sinus disease. Electronically Signed   By: Kennith Center M.D.   On: 02/15/2021 15:57   NM Myocar Multi W/Spect Izetta Dakin Motion / EF  Result Date: 02/15/2021 CLINICAL DATA:  Chest pain. Shortness of breath. History of MI with VF arrest. EXAM: MYOCARDIAL IMAGING WITH SPECT (REST AND PHARMACOLOGIC-STRESS) GATED  LEFT VENTRICULAR WALL MOTION STUDY LEFT VENTRICULAR EJECTION FRACTION TECHNIQUE: Standard myocardial SPECT imaging was performed after resting intravenous injection of 9.7 mCi Tc-26m tetrofosmin. Subsequently, intravenous infusion of Lexiscan was performed under the supervision of the Cardiology staff. At peak effect of the drug, 32 mCi Tc-43m tetrofosmin was injected intravenously and standard myocardial SPECT imaging was performed. Quantitative gated imaging was also performed to evaluate left ventricular wall motion, and estimate left ventricular ejection fraction. COMPARISON:  None. FINDINGS: Perfusion: No decreased activity in the left ventricle on stress imaging to suggest reversible ischemia or infarction. Wall Motion: Normal left ventricular wall motion. No left ventricular dilation. Left Ventricular Ejection Fraction: 52 % End diastolic volume 113 ml End systolic volume 54 ml IMPRESSION: 1. No reversible ischemia or infarction. 2. Normal left ventricular wall motion. 3. Left ventricular ejection fraction 52%. Of note, the end-diastolic and end systolic volumes are mildly abnormal. 4. Non invasive risk stratification*: Low *2012 Appropriate Use Criteria for Coronary Revascularization Focused Update: J Am Coll Cardiol. 2012;59(9):857-881. http://content.dementiazones.com.aspx?articleid=[email or web address] Electronically Signed   By: Gerome Sam III M.D   On: 02/15/2021 15:32   ECHOCARDIOGRAM COMPLETE  Result Date: 02/15/2021    ECHOCARDIOGRAM REPORT   Patient Name:   Sarah Fox Date of Exam: 02/15/2021 Medical Rec #:  [id number]       Height:       65.0 in Accession #:    [id number]      Weight:       157.4 lb Date of Birth:  [birth year]       BSA:          1.787 m Patient Age:    54 years        BP:           124/74 mmHg Patient Gender: F               HR:           59 bpm. Exam Location:  Inpatient Procedure: Cardiac Doppler, Color Doppler, 2D Echo and Intracardiac            Opacification Agent  Indications:    Syncope  History:        Patient has prior history of Echocardiogram examinations.  Sonographer:    Roosvelt Maser RDCS Referring Phys: [id number] RHONDA G BARRETT IMPRESSIONS  1. Left ventricular ejection fraction, by estimation, is 45 to 50%. The left ventricle has mildly decreased function. The left ventricle demonstrates regional wall motion abnormalities (see scoring diagram/findings for description). The left ventricular  internal cavity size was mildly  dilated. Left ventricular diastolic parameters were normal.  2. There is a filling defect in the LV apex on contrast images (best seen on images 88-90), concerning for LV thrombus  3. Right ventricular systolic function is normal. The right ventricular size is normal. Tricuspid regurgitation signal is inadequate for assessing PA pressure.  4. The mitral valve is normal in structure. Trivial mitral valve regurgitation.  5. The aortic valve was not well visualized. Aortic valve regurgitation is not visualized. No aortic stenosis is present. FINDINGS  Left Ventricle: Left ventricular ejection fraction, by estimation, is 45 to 50%. The left ventricle has mildly decreased function. The left ventricle demonstrates regional wall motion abnormalities. The left ventricular internal cavity size was mildly dilated. There is no left ventricular hypertrophy. Left ventricular diastolic parameters were normal.  LV Wall Scoring: The basal anteroseptal segment is akinetic. The mid and distal anterior septum and apex are hypokinetic. The entire anterior wall, entire lateral wall, entire inferior wall, mid inferoseptal segment, and basal inferoseptal segment are normal. Right Ventricle: The right ventricular size is normal. No increase in right ventricular wall thickness. Right ventricular systolic function is normal. Tricuspid regurgitation signal is inadequate for assessing PA pressure. Left Atrium: Left atrial size was normal in size. Right Atrium: Right atrial size  was normal in size. Pericardium: There is no evidence of pericardial effusion. Mitral Valve: The mitral valve is normal in structure. Trivial mitral valve regurgitation. Tricuspid Valve: The tricuspid valve is normal in structure. Tricuspid valve regurgitation is trivial. Aortic Valve: The aortic valve was not well visualized. Aortic valve regurgitation is not visualized. No aortic stenosis is present. Aortic valve mean gradient measures 4.0 mmHg. Aortic valve peak gradient measures 7.3 mmHg. Aortic valve area, by VTI measures 1.40 cm. Pulmonic Valve: The pulmonic valve was not well visualized. Pulmonic valve regurgitation is not visualized. Aorta: The aortic root and ascending aorta are structurally normal, with no evidence of dilitation. IAS/Shunts: The interatrial septum was not well visualized.  LEFT VENTRICLE PLAX 2D LVIDd:         5.40 cm      Diastology LVIDs:         4.10 cm      LV e' medial:    8.05 cm/s LV PW:         0.80 cm      LV E/e' medial:  10.8 LV IVS:        0.60 cm      LV e' lateral:   11.00 cm/s LVOT diam:     1.90 cm      LV E/e' lateral: 7.9 LV SV:         45 LV SV Index:   25 LVOT Area:     2.84 cm  LV Volumes (MOD) LV vol d, MOD A2C: 120.0 ml LV vol d, MOD A4C: 103.2 ml LV vol s, MOD A2C: 60.4 ml LV vol s, MOD A4C: 54.2 ml LV SV MOD A2C:     59.6 ml LV SV MOD A4C:     103.2 ml LV SV MOD BP:      51.4 ml RIGHT VENTRICLE RV Basal diam:  2.90 cm LEFT ATRIUM             Index       RIGHT ATRIUM           Index LA diam:        3.10 cm 1.74 cm/m  RA Area:     13.40 cm LA  Vol [facility]):   35.6 ml 19.92 ml/m RA Volume:   28.30 ml  15.84 ml/m LA Vol (A4C):   34.1 ml 19.09 ml/m LA Biplane Vol: 35.3 ml 19.76 ml/m  AORTIC VALVE AV Area (Vmax):    1.56 cm AV Area (Vmean):   1.46 cm AV Area (VTI):     1.40 cm AV Vmax:           135.00 cm/s AV Vmean:          93.800 cm/s AV VTI:            0.323 m AV Peak Grad:      7.3 mmHg AV Mean Grad:      4.0 mmHg LVOT Vmax:         74.40 cm/s LVOT Vmean:         48.400 cm/s LVOT VTI:          0.159 m LVOT/AV VTI ratio: 0.49  AORTA Ao Root diam: 2.90 cm Ao Asc diam:  2.90 cm MITRAL VALVE MV Area (PHT): 3.21 cm    SHUNTS MV Decel Time: 236 msec    Systemic VTI:  0.16 m MV E velocity: 87.00 cm/s  Systemic Diam: 1.90 cm MV A velocity: 71.60 cm/s MV E/A ratio:  1.22 Epifanio Lesches MD Electronically signed by Epifanio Lesches MD Signature Date/Time: 02/15/2021/2:34:55 PM    Final    VAS US CAROTID  Result Date: 02/16/2021 Carotid Arterial Duplex Study Indications:       Syncope. Risk Factors:      Hyperlipidemia. Comparison Study:  No prior studies. Performing Technologist: Chanda Busing RVT  Examination Guidelines: A complete evaluation includes B-mode imaging, spectral Doppler, color Doppler, and power Doppler as needed of all accessible portions of each vessel. Bilateral testing is considered an integral part of a complete examination. Limited examinations for reoccurring indications may be performed as noted.  Right Carotid Findings: +----------+--------+--------+--------+-----------------------+--------+           PSV cm/sEDV cm/sStenosisPlaque Description     Comments +----------+--------+--------+--------+-----------------------+--------+ CCA Prox  77      15              smooth and heterogenous         +----------+--------+--------+--------+-----------------------+--------+ CCA Distal64      19              smooth and heterogenous         +----------+--------+--------+--------+-----------------------+--------+ ICA Prox  64      17              smooth and heterogenous         +----------+--------+--------+--------+-----------------------+--------+ ICA Distal124     35                                              +----------+--------+--------+--------+-----------------------+--------+ ECA       89      16                                               +----------+--------+--------+--------+-----------------------+--------+ +----------+--------+-------+--------+-------------------+           PSV cm/sEDV cmsDescribeArm Pressure (mmHG) +----------+--------+-------+--------+-------------------+ [id number]                                        +----------+--------+-------+--------+-------------------+ +---------+--------+--+--------+--+---------+  VertebralPSV cm/s35EDV cm/s10Antegrade +---------+--------+--+--------+--+---------+  Left Carotid Findings: +----------+--------+--------+--------+-----------------------+--------+           PSV cm/sEDV cm/sStenosisPlaque Description     Comments +----------+--------+--------+--------+-----------------------+--------+ CCA Prox  86      16              smooth and heterogenous         +----------+--------+--------+--------+-----------------------+--------+ CCA Distal65      23              smooth and heterogenous         +----------+--------+--------+--------+-----------------------+--------+ ICA Prox  31      8               smooth and heterogenous         +----------+--------+--------+--------+-----------------------+--------+ ICA Distal69      34                                              +----------+--------+--------+--------+-----------------------+--------+ ECA       65      11                                              +----------+--------+--------+--------+-----------------------+--------+ +----------+--------+--------+--------+-------------------+           PSV cm/sEDV cm/sDescribeArm Pressure (mmHG) +----------+--------+--------+--------+-------------------+ [id number]                                          +----------+--------+--------+--------+-------------------+ +---------+--------+--+--------+--+---------+ VertebralPSV cm/s48EDV cm/s18Antegrade +---------+--------+--+--------+--+---------+   Summary: Right Carotid:  Velocities in the right ICA are consistent with a 1-39% stenosis. Left Carotid: Velocities in the left ICA are consistent with a 1-39% stenosis. Vertebrals: Bilateral vertebral arteries demonstrate antegrade flow. *See table(s) above for measurements and observations.  Electronically signed by Delia HeadyPramod Sethi MD on 02/16/2021 at 8:30:17 AM.    Final    Disposition   Pt is being discharged home today in good condition.  Follow-up Plans & Appointments    Follow-up Information    [facility] [address] [facility] Follow up on 02/19/2021.   Specialty: Cardiology Why: at 1030am  Contact information: [address], [address] [place] [id number] [phone]       Tereso NewcomerWeaver, Scott T, PA-C Follow up on 03/11/2021.   Specialties: Cardiology, Physician Assistant Why: at 845am  Contact information: [address] [place] [address] [phone]              Discharge Instructions    Call MD for:  difficulty breathing, headache or visual disturbances   Complete by: As directed    Call MD for:  extreme fatigue   Complete by: As directed    Call MD for:  hives   Complete by: As directed    Call MD for:  persistant dizziness or light-headedness   Complete by: As directed    Call MD for:  persistant nausea and vomiting   Complete by: As directed    Call MD for:  redness, tenderness, or signs of infection (pain, swelling, redness, odor or green/yellow discharge around incision site)   Complete by: As directed    Call MD for:  severe  uncontrolled pain   Complete by: As directed    Call MD for:  temperature >100.4   Complete by: As directed    Diet - low sodium heart healthy   Complete by: As directed    Increase activity slowly   Complete by: As directed      Discharge Medications   Allergies as of 02/16/2021      Reactions   Sulfa Antibiotics Rash      Medication List    STOP taking these medications   clopidogrel 75 MG tablet Commonly  known as: PLAVIX     TAKE these medications   aspirin EC 81 MG tablet Take 81 mg by mouth daily. Swallow whole.   atorvastatin 40 MG tablet Commonly known as: LIPITOR TAKE 1 TABLET BY MOUTH EVERY DAY   cetirizine 10 MG tablet Commonly known as: ZYRTEC Take 1 tablet (10 mg total) by mouth daily.   enoxaparin 80 MG/0.8ML injection Commonly known as: LOVENOX Inject 0.7 mLs (70 mg total) into the skin every 12 (twelve) hours for 14 days.   escitalopram 20 MG tablet Commonly known as: LEXAPRO Take 1 tablet (20 mg total) by mouth daily. (Needs to be seen before next refill)   fluticasone 50 MCG/ACT nasal spray Commonly known as: FLONASE Place 2 sprays into both nostrils daily.   levothyroxine 88 MCG tablet Commonly known as: Synthroid Take 1 tablet (88 mcg total) by mouth daily.   metoprolol succinate 25 MG 24 hr tablet Commonly known as: TOPROL-XL Take 1 tablet (25 mg total) by mouth daily.   nitroGLYCERIN 0.4 MG SL tablet Commonly known as: Nitrostat Place 1 tablet (0.4 mg total) under the tongue every 5 (five) minutes as needed for chest pain.   pantoprazole 40 MG tablet Commonly known as: PROTONIX TAKE 1 TABLET BY MOUTH EVERY DAY   warfarin 5 MG tablet Commonly known as: COUMADIN Take 1 tablet (5 mg total) by mouth one time only at 4 PM.       Outstanding Labs/Studies   INR   Duration of Discharge Encounter   Greater than 30 minutes including physician time.  Signed, Georgie Chard, NP 02/16/2021, 1:25 PM

## 2021-02-16 NOTE — Care Management (Signed)
02-16-21 Benefits check submitted for Lovenox. Case Manager will follow for cost. Graves-Bigelow, Lamar Laundry , RN, BSN Case Manager

## 2021-02-16 NOTE — Progress Notes (Addendum)
Progress Note  Patient Name: Sarah Fox Date of Encounter: 02/16/2021  [facility] Cardiologist: Lesleigh Noe, MD   Subjective   Denies any chest pain or dyspnea.  No further lightheadedness  Inpatient Medications    Scheduled Meds: . aspirin EC  81 mg Oral Daily  . atorvastatin  40 mg Oral Daily  . enoxaparin (LOVENOX) injection  70 mg Subcutaneous Q12H  . escitalopram  40 mg Oral Daily  . fluticasone  2 spray Each Nare Daily  . levothyroxine  88 mcg Oral Q0600  . metoprolol succinate  25 mg Oral Daily  . pantoprazole  40 mg Oral Daily  . sodium chloride flush  3 mL Intravenous Q12H  . warfarin  5 mg Oral ONCE-1600  . Warfarin - Pharmacist Dosing Inpatient   Does not apply q1600   Continuous Infusions: . sodium chloride     PRN Meds: sodium chloride, acetaminophen, ALPRAZolam, nitroGLYCERIN, ondansetron (ZOFRAN) IV, sodium chloride flush, zolpidem   Vital Signs    Vitals:   02/15/21 1613 02/15/21 2046 02/16/21 0523 02/16/21 0739  BP: 121/79 130/88 122/76 118/73  Pulse:  (!) 59  74  Resp: (!) 21 16 18 16   Temp: 98.8 F (37.1 C) 98.2 F (36.8 C) 97.7 F (36.5 C) 98 F (36.7 C)  TempSrc: Oral  Oral Axillary  SpO2:  98% 99% 98%  Weight:   72.4 kg   Height:       No intake or output data in the 24 hours ending 02/16/21 0840 Last 3 Weights 02/16/2021 02/15/2021 02/14/2021  Weight (lbs) 159 lb 9.8 oz 157 lb 6.5 oz 154 lb 15.7 oz  Weight (kg) 72.4 kg 71.4 kg 70.3 kg      Telemetry    NSR - Personally Reviewed  ECG    No new ECG- Personally Reviewed  Physical Exam   GEN: No acute distress.   Neck: No JVD Cardiac: RRR, no murmurs, rubs, or gallops.  Respiratory: Clear to auscultation bilaterally. GI: Soft, nontender, non-distended  MS: No edema; No deformity. Neuro:  Nonfocal  Psych: Normal affect   Labs    High Sensitivity Troponin:   Recent Labs  Lab 02/14/21 0740 02/14/21 0941  TROPONINIHS 4 3      Chemistry Recent Labs  Lab  02/14/21 0740 02/14/21 1607 02/15/21 0500  NA 141  --  138  K 3.8  --  3.2*  CL 106  --  102  CO2 24  --  25  GLUCOSE 94  --  94  BUN 9  --  11  CREATININE 0.85 0.80 0.85  CALCIUM 8.9  --  8.9  PROT  --   --  6.2*  ALBUMIN  --   --  3.5  AST  --   --  21  ALT  --   --  29  ALKPHOS  --   --  57  BILITOT  --   --  0.5  GFRNONAA >60 >60 >60  ANIONGAP 11  --  11     Hematology Recent Labs  Lab 02/14/21 0740 02/14/21 1607 02/16/21 0012  WBC 6.9 8.9 9.1  RBC 4.07 4.22 4.10  HGB 12.8 13.1 12.7  HCT 37.6 38.6 37.3  MCV 92.4 91.5 91.0  MCH 31.4 31.0 31.0  MCHC 34.0 33.9 34.0  RDW 13.4 13.4 13.4  PLT 267 265 265    BNPNo results for input(s): BNP, PROBNP in the last 168 hours.   DDimer No results  for input(s): DDIMER in the last 168 hours.   Radiology    CT HEAD WO CONTRAST  Result Date: 02/15/2021 CLINICAL DATA:  Dizziness with lightheadedness. EXAM: CT HEAD WITHOUT CONTRAST TECHNIQUE: Contiguous axial images were obtained from the base of the skull through the vertex without intravenous contrast. COMPARISON:  CTA head 02/04/2021.  Head CT 09/10/2017. FINDINGS: Brain: There is no evidence for acute hemorrhage, hydrocephalus, mass lesion, or abnormal extra-axial fluid collection. No definite CT evidence for acute infarction. Vascular: No hyperdense vessel or unexpected calcification. Skull: No evidence for fracture. No worrisome lytic or sclerotic lesion. Sinuses/Orbits: Polypoid mucosal thickening noted in the maxillary sinuses with chronic mucosal disease noted left sphenoid sinus. Mastoid air cells are clear. Visualized portions of the globes and intraorbital fat are unremarkable. Other: None. IMPRESSION: 1. No acute intracranial abnormality. 2. Chronic paranasal sinus disease. Electronically Signed   By: Kennith Center M.D.   On: 02/15/2021 15:57   NM Myocar Multi W/Spect Izetta Dakin Motion / EF  Result Date: 02/15/2021 CLINICAL DATA:  Chest pain. Shortness of breath. History  of MI with VF arrest. EXAM: MYOCARDIAL IMAGING WITH SPECT (REST AND PHARMACOLOGIC-STRESS) GATED LEFT VENTRICULAR WALL MOTION STUDY LEFT VENTRICULAR EJECTION FRACTION TECHNIQUE: Standard myocardial SPECT imaging was performed after resting intravenous injection of 9.7 mCi Tc-48m tetrofosmin. Subsequently, intravenous infusion of Lexiscan was performed under the supervision of the Cardiology staff. At peak effect of the drug, 32 mCi Tc-33m tetrofosmin was injected intravenously and standard myocardial SPECT imaging was performed. Quantitative gated imaging was also performed to evaluate left ventricular wall motion, and estimate left ventricular ejection fraction. COMPARISON:  None. FINDINGS: Perfusion: No decreased activity in the left ventricle on stress imaging to suggest reversible ischemia or infarction. Wall Motion: Normal left ventricular wall motion. No left ventricular dilation. Left Ventricular Ejection Fraction: 52 % End diastolic volume 113 ml End systolic volume 54 ml IMPRESSION: 1. No reversible ischemia or infarction. 2. Normal left ventricular wall motion. 3. Left ventricular ejection fraction 52%. Of note, the end-diastolic and end systolic volumes are mildly abnormal. 4. Non invasive risk stratification*: Low *2012 Appropriate Use Criteria for Coronary Revascularization Focused Update: J Am Coll Cardiol. 2012;59(9):857-881. http://content.dementiazones.com.aspx?articleid=[email or web address] Electronically Signed   By: Gerome Sam III M.D   On: 02/15/2021 15:32   ECHOCARDIOGRAM COMPLETE  Result Date: 02/15/2021    ECHOCARDIOGRAM REPORT   Patient Name:   Sarah Fox Date of Exam: 02/15/2021 Medical Rec #:  [id number]       Height:       65.0 in Accession #:    [id number]      Weight:       157.4 lb Date of Birth:  06/13/1966       BSA:          1.787 m Patient Age:    55 years        BP:           124/74 mmHg Patient Gender: F               HR:           59 bpm. Exam Location:  Inpatient Procedure:  Cardiac Doppler, Color Doppler, 2D Echo and Intracardiac            Opacification Agent Indications:    Syncope  History:        Patient has prior history of Echocardiogram examinations.  Sonographer:    Roosvelt Maser RDCS Referring Phys: [id number] RHONDA G BARRETT IMPRESSIONS  1. Left ventricular ejection fraction, by estimation, is 45 to 50%. The left ventricle has mildly decreased function. The left ventricle demonstrates regional wall motion abnormalities (see scoring diagram/findings for description). The left ventricular  internal cavity size was mildly dilated. Left ventricular diastolic parameters were normal.  2. There is a filling defect in the LV apex on contrast images (best seen on images 88-90), concerning for LV thrombus  3. Right ventricular systolic function is normal. The right ventricular size is normal. Tricuspid regurgitation signal is inadequate for assessing PA pressure.  4. The mitral valve is normal in structure. Trivial mitral valve regurgitation.  5. The aortic valve was not well visualized. Aortic valve regurgitation is not visualized. No aortic stenosis is present. FINDINGS  Left Ventricle: Left ventricular ejection fraction, by estimation, is 45 to 50%. The left ventricle has mildly decreased function. The left ventricle demonstrates regional wall motion abnormalities. The left ventricular internal cavity size was mildly dilated. There is no left ventricular hypertrophy. Left ventricular diastolic parameters were normal.  LV Wall Scoring: The basal anteroseptal segment is akinetic. The mid and distal anterior septum and apex are hypokinetic. The entire anterior wall, entire lateral wall, entire inferior wall, mid inferoseptal segment, and basal inferoseptal segment are normal. Right Ventricle: The right ventricular size is normal. No increase in right ventricular wall thickness. Right ventricular systolic function is normal. Tricuspid regurgitation signal is inadequate for assessing PA  pressure. Left Atrium: Left atrial size was normal in size. Right Atrium: Right atrial size was normal in size. Pericardium: There is no evidence of pericardial effusion. Mitral Valve: The mitral valve is normal in structure. Trivial mitral valve regurgitation. Tricuspid Valve: The tricuspid valve is normal in structure. Tricuspid valve regurgitation is trivial. Aortic Valve: The aortic valve was not well visualized. Aortic valve regurgitation is not visualized. No aortic stenosis is present. Aortic valve mean gradient measures 4.0 mmHg. Aortic valve peak gradient measures 7.3 mmHg. Aortic valve area, by VTI measures 1.40 cm. Pulmonic Valve: The pulmonic valve was not well visualized. Pulmonic valve regurgitation is not visualized. Aorta: The aortic root and ascending aorta are structurally normal, with no evidence of dilitation. IAS/Shunts: The interatrial septum was not well visualized.  LEFT VENTRICLE PLAX 2D LVIDd:         5.40 cm      Diastology LVIDs:         4.10 cm      LV e' medial:    8.05 cm/s LV PW:         0.80 cm      LV E/e' medial:  10.8 LV IVS:        0.60 cm      LV e' lateral:   11.00 cm/s LVOT diam:     1.90 cm      LV E/e' lateral: 7.9 LV SV:         45 LV SV Index:   25 LVOT Area:     2.84 cm  LV Volumes (MOD) LV vol d, MOD A2C: 120.0 ml LV vol d, MOD A4C: 103.2 ml LV vol s, MOD A2C: 60.4 ml LV vol s, MOD A4C: 54.2 ml LV SV MOD A2C:     59.6 ml LV SV MOD A4C:     103.2 ml LV SV MOD BP:      51.4 ml RIGHT VENTRICLE RV Basal diam:  2.90 cm LEFT ATRIUM             Index  RIGHT ATRIUM           Index LA diam:        3.10 cm 1.74 cm/m  RA Area:     13.40 cm LA Vol (A2C):   35.6 ml 19.92 ml/m RA Volume:   28.30 ml  15.84 ml/m LA Vol (A4C):   34.1 ml 19.09 ml/m LA Biplane Vol: 35.3 ml 19.76 ml/m  AORTIC VALVE AV Area (Vmax):    1.56 cm AV Area (Vmean):   1.46 cm AV Area (VTI):     1.40 cm AV Vmax:           135.00 cm/s AV Vmean:          93.800 cm/s AV VTI:            0.323 m AV Peak  Grad:      7.3 mmHg AV Mean Grad:      4.0 mmHg LVOT Vmax:         74.40 cm/s LVOT Vmean:        48.400 cm/s LVOT VTI:          0.159 m LVOT/AV VTI ratio: 0.49  AORTA Ao Root diam: 2.90 cm Ao Asc diam:  2.90 cm MITRAL VALVE MV Area (PHT): 3.21 cm    SHUNTS MV Decel Time: 236 msec    Systemic VTI:  0.16 m MV E velocity: 87.00 cm/s  Systemic Diam: 1.90 cm MV A velocity: 71.60 cm/s MV E/A ratio:  1.22 Epifanio Lesches MD Electronically signed by Epifanio Lesches MD Signature Date/Time: 02/15/2021/2:34:55 PM    Final    VAS US CAROTID  Result Date: 02/16/2021 Carotid Arterial Duplex Study Indications:       Syncope. Risk Factors:      Hyperlipidemia. Comparison Study:  No prior studies. Performing Technologist: Chanda Busing RVT  Examination Guidelines: A complete evaluation includes B-mode imaging, spectral Doppler, color Doppler, and power Doppler as needed of all accessible portions of each vessel. Bilateral testing is considered an integral part of a complete examination. Limited examinations for reoccurring indications may be performed as noted.  Right Carotid Findings: +----------+--------+--------+--------+-----------------------+--------+           PSV cm/sEDV cm/sStenosisPlaque Description     Comments +----------+--------+--------+--------+-----------------------+--------+ CCA Prox  77      15              smooth and heterogenous         +----------+--------+--------+--------+-----------------------+--------+ CCA Distal64      19              smooth and heterogenous         +----------+--------+--------+--------+-----------------------+--------+ ICA Prox  64      17              smooth and heterogenous         +----------+--------+--------+--------+-----------------------+--------+ ICA Distal124     35                                              +----------+--------+--------+--------+-----------------------+--------+ ECA       89      16                                               +----------+--------+--------+--------+-----------------------+--------+ +----------+--------+-------+--------+-------------------+  PSV cm/sEDV cmsDescribeArm Pressure (mmHG) +----------+--------+-------+--------+-------------------+ [id number]                                        +----------+--------+-------+--------+-------------------+ +---------+--------+--+--------+--+---------+ VertebralPSV cm/s35EDV cm/s10Antegrade +---------+--------+--+--------+--+---------+  Left Carotid Findings: +----------+--------+--------+--------+-----------------------+--------+           PSV cm/sEDV cm/sStenosisPlaque Description     Comments +----------+--------+--------+--------+-----------------------+--------+ CCA Prox  86      16              smooth and heterogenous         +----------+--------+--------+--------+-----------------------+--------+ CCA Distal65      23              smooth and heterogenous         +----------+--------+--------+--------+-----------------------+--------+ ICA Prox  31      8               smooth and heterogenous         +----------+--------+--------+--------+-----------------------+--------+ ICA Distal69      34                                              +----------+--------+--------+--------+-----------------------+--------+ ECA       65      11                                              +----------+--------+--------+--------+-----------------------+--------+ +----------+--------+--------+--------+-------------------+           PSV cm/sEDV cm/sDescribeArm Pressure (mmHG) +----------+--------+--------+--------+-------------------+ [id number]                                          +----------+--------+--------+--------+-------------------+ +---------+--------+--+--------+--+---------+ VertebralPSV cm/s48EDV cm/s18Antegrade +---------+--------+--+--------+--+---------+    Summary: Right Carotid: Velocities in the right ICA are consistent with a 1-39% stenosis. Left Carotid: Velocities in the left ICA are consistent with a 1-39% stenosis. Vertebrals: Bilateral vertebral arteries demonstrate antegrade flow. *See table(s) above for measurements and observations.  Electronically signed by Delia HeadyPramod Sethi MD on 02/16/2021 at 8:30:17 AM.    Final     Cardiac Studies     Patient Profile     55 y.o. 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  Assessment & Plan    Near syncope: reported episode of N/V and diaphoresis, along with dyspnea and near syncope.  Suspect vasovagal.  Reports felt similar to prior MI but no chest pain. -Echo shows EF 45-50%, LV apical thrombus.  Discussed with neurology, head CT with no abnormalities, do not suspect TIA.  OK to anticoagulate -Lexiscan Myoview shows no ischemia -Carotid Dopplers show mild stenosis bilaterally -Plan 30 day cardiac monitor on discharge to rule out arrhythmia  LV thrombus: filling defect on contrast images on echo.  Will start on warfarin with lovenox bridge.  Will discontinue plavix since starting coumadin  CAD: s/p anterior MI in 2018.  On ASA/plavix.  D/c plavix as above since starting warfarin.  Myoview this admission shows no ischemia  Hypokalemia: unclear cause, noted on labs yesterday.  Will f/u BMET today  Disposition: OK to discharge today, she has received instructions on lovenox injections and her husband is comfortable giving the injections.  Will d/c on warfarin with lovenox bridge and schedule f/u in coumadin clinic.  Will schedule f/u with Dr Katrinka Blazing.  For questions or updates, please contact [facility] Please consult [email or web address] for contact info under        Signed, Little Ishikawa, MD  02/16/2021, 8:40 AM

## 2021-02-16 NOTE — Progress Notes (Addendum)
ANTICOAGULATION CONSULT NOTE  Pharmacy Consult for Lovenox + Coumadin Indication: LV thrombus  Allergies  Allergen Reactions  . Sulfa Antibiotics Rash    Patient Measurements: Height: 5\' 5"  (165.1 cm) Weight: 72.4 kg (159 lb 9.8 oz) IBW/kg (Calculated) : 57 Heparin Dosing Weight: 71.4kg  Vital Signs: Temp: 97.7 F (36.5 C) (02/21 0523) Temp Source: Oral (02/21 0523) BP: 122/76 (02/21 0523) Pulse Rate: 59 (02/20 2046)  Labs: Recent Labs    02/14/21 0740 02/14/21 0941 02/14/21 1607 02/15/21 0500 02/15/21 1705 02/16/21 0012  HGB 12.8  --  13.1  --   --  12.7  HCT 37.6  --  38.6  --   --  37.3  PLT 267  --  265  --   --  265  LABPROT  --   --   --   --  13.2 12.9  INR  --   --   --   --  1.0 1.0  CREATININE 0.85  --  0.80 0.85  --   --   TROPONINIHS 4 3  --   --   --   --     Estimated Creatinine Clearance: 77.2 mL/min (by C-G formula based on SCr of 0.85 mg/dL).   Medical History: Past Medical History:  Diagnosis Date  . Acute ST elevation myocardial infarction (STEMI) of anterior wall (HCC)   . Allergy   . CAD in native artery   . Cardiac arrest with ventricular fibrillation (HCC) 10/2017   with acute MI and hypokalemia  . Coronary artery disease   . GERD (gastroesophageal reflux disease)   . Ischemic cardiomyopathy 10/2017  . Myocardial infarction acute (HCC) 10/2017  . Thyroid disease    hypo  . VF (ventricular fibrillation) [facility])     Assessment: 55 yo female with newly dx LV thrombus.  Pharmacy asked to begin Coumadin with Lovenox bridge.    Hgb 12.7, plt 265. Scr 0.85 (CrCl 77 mL/min). INR remains unchanged at 1. No s/sx of bleeding.   Goal of Therapy:  INR 2-3 Anti-Xa level 0.6-1 units/ml 4hrs after LMWH dose given Monitor platelets by anticoagulation protocol: Yes   Plan:  Lovenox 1 mg/kg q 12 hrs (= 70 mg q 12 hrs) Coumadin 5 mg po x 1 tonight - would continue warfarin 5 mg daily at discharge Daily INR. Will educate pt re: Coumadin prior  to discharge.  46, PharmD, BCCCP Clinical Pharmacist  Phone: [phone] 02/16/2021 7:34 AM  Please check AMION for all [facility] phone numbers After 10:00 PM, call Main Pharmacy [phone]

## 2021-02-16 NOTE — TOC Benefit Eligibility Note (Signed)
Transition of Care [facility]) Benefit Eligibility Note    Patient Details  Name: Tephanie Escorcia MRN: [id number] Date of Birth: Apr 02, 1966   Medication/Dose: LOVENOX  46 MG BID  CO-PAY-  $7.50   and  ENOXAPARIN 80 MG  CO-PAY- $7.50  [place]     Tier: 3 Drug  Prescription Coverage Preferred Pharmacy: [facility]  Spoke with Person/Company/Phone Number:: ZAHEA  @  [employer] # [phone] [employer]- [employer]  Co-Pay: $7.50   FILL TODAY NEXT REFILL 02-24-21  Prior Approval: No  Deductible: Met  Additional Notes: 70 MG BID  -NON-FORMULARY    Memory Argue Phone Number: 02/16/2021, 3:37 PM

## 2021-02-19 ENCOUNTER — Ambulatory Visit (INDEPENDENT_AMBULATORY_CARE_PROVIDER_SITE_OTHER): Payer: Federal, State, Local not specified - PPO | Admitting: *Deleted

## 2021-02-19 ENCOUNTER — Other Ambulatory Visit: Payer: Self-pay

## 2021-02-19 DIAGNOSIS — I513 Intracardiac thrombosis, not elsewhere classified: Secondary | ICD-10-CM

## 2021-02-19 DIAGNOSIS — Z5181 Encounter for therapeutic drug level monitoring: Secondary | ICD-10-CM | POA: Insufficient documentation

## 2021-02-19 LAB — POCT INR: INR: 1.3 — AB (ref 2.0–3.0)

## 2021-02-19 NOTE — Patient Instructions (Signed)
Description   Continue Lovenox injections. Take warfarin 1.5 tablets today and then continue to take 1 tablet daily. Recheck INR on 2/28. Call coumadin clinic for any changes in medications or upcoming procedures. Coumadin Clinic [phone].   A full discussion of the nature of anticoagulants has been carried out.  A benefit risk analysis has been presented to the patient, so that they understand the justification for choosing anticoagulation at this time. The need for frequent and regular monitoring, precise dosage adjustment and compliance is stressed.  Side effects of potential bleeding are discussed.  The patient should avoid any OTC items containing aspirin or ibuprofen, and should avoid great swings in general diet.  Avoid alcohol consumption.  Call if any signs of abnormal bleeding.

## 2021-02-20 ENCOUNTER — Ambulatory Visit (INDEPENDENT_AMBULATORY_CARE_PROVIDER_SITE_OTHER): Payer: Federal, State, Local not specified - PPO

## 2021-02-20 DIAGNOSIS — R55 Syncope and collapse: Secondary | ICD-10-CM | POA: Diagnosis not present

## 2021-02-23 ENCOUNTER — Ambulatory Visit (INDEPENDENT_AMBULATORY_CARE_PROVIDER_SITE_OTHER): Payer: Federal, State, Local not specified - PPO | Admitting: *Deleted

## 2021-02-23 ENCOUNTER — Other Ambulatory Visit: Payer: Self-pay

## 2021-02-23 DIAGNOSIS — I513 Intracardiac thrombosis, not elsewhere classified: Secondary | ICD-10-CM

## 2021-02-23 DIAGNOSIS — Z5181 Encounter for therapeutic drug level monitoring: Secondary | ICD-10-CM

## 2021-02-23 LAB — POCT INR: INR: 1.8 — AB (ref 2.0–3.0)

## 2021-02-23 NOTE — Patient Instructions (Signed)
Description   Continue lovenox injections. Start taking 1 tablet of warfarin daily except for 1.5 tablets of warfarin on Mondays and Fridays. Call coumadin clinic for any changes in medications or upcoming procedures [phone].

## 2021-02-26 ENCOUNTER — Other Ambulatory Visit: Payer: Self-pay

## 2021-02-26 ENCOUNTER — Ambulatory Visit (INDEPENDENT_AMBULATORY_CARE_PROVIDER_SITE_OTHER): Payer: Federal, State, Local not specified - PPO

## 2021-02-26 DIAGNOSIS — I513 Intracardiac thrombosis, not elsewhere classified: Secondary | ICD-10-CM | POA: Diagnosis not present

## 2021-02-26 DIAGNOSIS — Z5181 Encounter for therapeutic drug level monitoring: Secondary | ICD-10-CM | POA: Diagnosis not present

## 2021-02-26 LAB — POCT INR: INR: 1.9 — AB (ref 2.0–3.0)

## 2021-02-26 NOTE — Patient Instructions (Signed)
Description   Take 1.5 tablets today, then start taking 1 tablet of warfarin daily except for 1.5 tablets of warfarin on Mondays, Wednesdays and Fridays. Continue Lovenox twice daily, take your last dosage in the pm on 02/27/21.  Call coumadin clinic for any changes in medications or upcoming procedures [phone].

## 2021-03-04 ENCOUNTER — Other Ambulatory Visit: Payer: Self-pay

## 2021-03-04 ENCOUNTER — Ambulatory Visit (INDEPENDENT_AMBULATORY_CARE_PROVIDER_SITE_OTHER): Payer: Federal, State, Local not specified - PPO | Admitting: *Deleted

## 2021-03-04 DIAGNOSIS — I513 Intracardiac thrombosis, not elsewhere classified: Secondary | ICD-10-CM

## 2021-03-04 DIAGNOSIS — Z5181 Encounter for therapeutic drug level monitoring: Secondary | ICD-10-CM

## 2021-03-04 LAB — POCT INR: INR: 2 (ref 2.0–3.0)

## 2021-03-04 NOTE — Patient Instructions (Signed)
Description   Start taking warfarin 1.5 tablets daily except for 1 tablet on Sunday, Tuesday and Thursday. Recheck INR in 1 week. Call coumadin clinic for any changes in medications or upcoming procedures [phone].
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
## 2021-03-11 ENCOUNTER — Ambulatory Visit (INDEPENDENT_AMBULATORY_CARE_PROVIDER_SITE_OTHER): Payer: Federal, State, Local not specified - PPO

## 2021-03-11 ENCOUNTER — Ambulatory Visit: Payer: Federal, State, Local not specified - PPO | Admitting: Physician Assistant

## 2021-03-11 ENCOUNTER — Encounter: Payer: Self-pay | Admitting: Physician Assistant

## 2021-03-11 ENCOUNTER — Other Ambulatory Visit: Payer: Self-pay

## 2021-03-11 VITALS — BP 108/70 | HR 60 | Ht 65.0 in | Wt 156.6 lb

## 2021-03-11 DIAGNOSIS — Z5181 Encounter for therapeutic drug level monitoring: Secondary | ICD-10-CM | POA: Diagnosis not present

## 2021-03-11 DIAGNOSIS — R55 Syncope and collapse: Secondary | ICD-10-CM

## 2021-03-11 DIAGNOSIS — I5020 Unspecified systolic (congestive) heart failure: Secondary | ICD-10-CM | POA: Diagnosis not present

## 2021-03-11 DIAGNOSIS — R072 Precordial pain: Secondary | ICD-10-CM

## 2021-03-11 DIAGNOSIS — I513 Intracardiac thrombosis, not elsewhere classified: Secondary | ICD-10-CM

## 2021-03-11 DIAGNOSIS — I2510 Atherosclerotic heart disease of native coronary artery without angina pectoris: Secondary | ICD-10-CM

## 2021-03-11 DIAGNOSIS — E785 Hyperlipidemia, unspecified: Secondary | ICD-10-CM

## 2021-03-11 LAB — POCT INR: INR: 3.2 — AB (ref 2.0–3.0)

## 2021-03-11 MED ORDER — PANTOPRAZOLE SODIUM 40 MG PO TBEC: 40.0000 mg | DELAYED_RELEASE_TABLET | Freq: Two times a day (BID) | ORAL | 2 refills | Status: DC

## 2021-03-11 NOTE — Patient Instructions (Signed)
Medication Instructions:  Your physician has recommended you make the following change in your medication:   1. Increase your protonix one tablet by mouth ( 40 mg) twice a day, sent in # 90 to requested pharmacy.  *If you need a refill on your cardiac medications before your next appointment, please call your pharmacy*   Lab Work: -None  If you have labs (blood work) drawn today and your tests are completely normal, you will receive your results only by: Marland Kitchen MyChart Message (if you have MyChart) OR . A paper copy in the mail If you have any lab test that is abnormal or we need to change your treatment, we will call you to review the results.   Testing/Procedures: -None   Follow-Up: At [facility], you and your health needs are our priority.  As part of our continuing mission to provide you with exceptional heart care, we have created designated Provider Care Teams.  These Care Teams include your primary Cardiologist (physician) and Advanced Practice Providers (APPs -  Physician Assistants and Nurse Practitioners) who all work together to provide you with the care you need, when you need it.  We recommend signing up for the patient portal called "MyChart".  Sign up information is provided on this After Visit Summary.  MyChart is used to connect with patients for Virtual Visits (Telemedicine).  Patients are able to view lab/test results, encounter notes, upcoming appointments, etc.  Non-urgent messages can be sent to your provider as well.   To learn more about what you can do with MyChart, go to [email or web address].    Your next appointment:   6 week(s)  The format for your next appointment:   In Person  Provider:   Verdis Prime, MD   Other Instructions You have been referred to GI it might take a couple weeks to get called.   Call if Nitroglycerin relieves your chest pain or send mychart message.  Return sooner if your symptoms get worse.  OK to take Mylanta as needed  for burning.

## 2021-03-11 NOTE — Patient Instructions (Signed)
Description   Start taking warfarin 1 tablet daily except for 1.5 tablets on Mondays, Wednesdays and Fridays.  Recheck INR in 2 weeks. Call coumadin clinic for any changes in medications or upcoming procedures [phone].

## 2021-03-12 ENCOUNTER — Other Ambulatory Visit: Payer: Self-pay | Admitting: *Deleted

## 2021-03-12 MED ORDER — WARFARIN SODIUM 5 MG PO TABS: ORAL_TABLET | ORAL | 1 refills | Status: DC

## 2021-03-12 NOTE — Telephone Encounter (Signed)
Prescription refill request received for warfarin Lov: 3/16 Next INR check:  3/28 Warfarin tablet strength: 5mg 

## 2021-03-23 ENCOUNTER — Other Ambulatory Visit: Payer: Self-pay

## 2021-03-23 ENCOUNTER — Ambulatory Visit (INDEPENDENT_AMBULATORY_CARE_PROVIDER_SITE_OTHER): Payer: Federal, State, Local not specified - PPO | Admitting: *Deleted

## 2021-03-23 DIAGNOSIS — Z5181 Encounter for therapeutic drug level monitoring: Secondary | ICD-10-CM | POA: Diagnosis not present

## 2021-03-23 DIAGNOSIS — I513 Intracardiac thrombosis, not elsewhere classified: Secondary | ICD-10-CM | POA: Diagnosis not present

## 2021-03-23 LAB — POCT INR: INR: 3.3 — AB (ref 2.0–3.0)

## 2021-03-23 NOTE — Patient Instructions (Signed)
Description   Hold warfarin today, then START taking warfarin 1 tablet daily except for 1.5 tablets on MONDAYS AND FRIDAYS. Recheck INR in 2 weeks. Coumadin Clinic [phone].

## 2021-03-27 ENCOUNTER — Other Ambulatory Visit: Payer: Self-pay | Admitting: Family

## 2021-03-27 DIAGNOSIS — F411 Generalized anxiety disorder: Secondary | ICD-10-CM

## 2021-03-27 DIAGNOSIS — F321 Major depressive disorder, single episode, moderate: Secondary | ICD-10-CM

## 2021-04-03 ENCOUNTER — Other Ambulatory Visit: Payer: Self-pay | Admitting: Interventional Cardiology

## 2021-04-06 ENCOUNTER — Other Ambulatory Visit: Payer: Self-pay

## 2021-04-06 ENCOUNTER — Ambulatory Visit (INDEPENDENT_AMBULATORY_CARE_PROVIDER_SITE_OTHER): Payer: Federal, State, Local not specified - PPO

## 2021-04-06 DIAGNOSIS — Z5181 Encounter for therapeutic drug level monitoring: Secondary | ICD-10-CM

## 2021-04-06 DIAGNOSIS — I513 Intracardiac thrombosis, not elsewhere classified: Secondary | ICD-10-CM | POA: Diagnosis not present

## 2021-04-06 LAB — POCT INR: INR: 2.7 (ref 2.0–3.0)

## 2021-04-06 NOTE — Patient Instructions (Signed)
-   continue taking warfarin 1 tablet daily except for 1.5 tablets on MONDAYS AND FRIDAYS.  - Recheck INR in 2 weeks at your appt w/ Dr. Katrinka Blazing Coumadin Clinic [phone].

## 2021-04-15 ENCOUNTER — Other Ambulatory Visit: Payer: Self-pay | Admitting: Interventional Cardiology

## 2021-04-19 ENCOUNTER — Other Ambulatory Visit: Payer: Self-pay | Admitting: Family

## 2021-04-19 DIAGNOSIS — H9312 Tinnitus, left ear: Secondary | ICD-10-CM

## 2021-04-19 DIAGNOSIS — F411 Generalized anxiety disorder: Secondary | ICD-10-CM

## 2021-04-19 DIAGNOSIS — F321 Major depressive disorder, single episode, moderate: Secondary | ICD-10-CM

## 2021-04-19 NOTE — Progress Notes (Signed)
Cardiology Office Note:    Date:  04/20/2021   ID:  Sarah Fox, Sarah Fox 1966-07-31, MRN [id number]  PCP:  Junie Spencer, FNP  Cardiologist:  Lesleigh Noe, MD   Referring MD: Junie Spencer, FNP   No chief complaint on file.   History of Present Illness:    Sarah Fox is a 55 y.o. female with a hx of CAD, anterior MI accompanied by V fib and successful resuscitation and acute systolic HF-with recover to HFpEF. RFM with lipid and tobacco management.  [name] had a near syncopal episode and chest pain in February.  This led to admission and a pretty significant cardiac evaluation that included a myocardial perfusion study, 2D Doppler echocardiogram, bilateral carotid Doppler study, and a 30-day cardiac monitor.  She was found to have an LV apical thrombus and was started on Coumadin even though her myocardial infarction occurred in 2018.  She has had no bleeding on Coumadin.  No recurrence of symptoms.  Prior to the admission she did have occasional episodes of left shoulder chest and interscapula discomfort which led to the extensive work-up.  Cardiac markers were unremarkable.  Past Medical History:  Diagnosis Date  . Acute ST elevation myocardial infarction (STEMI) of anterior wall (HCC)   . Allergy   . CAD in native artery   . Cardiac arrest with ventricular fibrillation (HCC) 10/2017   with acute MI and hypokalemia  . Coronary artery disease   . GERD (gastroesophageal reflux disease)   . Ischemic cardiomyopathy 10/2017  . Myocardial infarction acute (HCC) 10/2017  . Thyroid disease    hypo  . VF (ventricular fibrillation) [facility])     Past Surgical History:  Procedure Laterality Date  . CORONARY STENT INTERVENTION N/A 11/29/2017   Procedure: CORONARY STENT INTERVENTION;  Surgeon: Lyn Records, MD;  Location: [facility] INVASIVE CV LAB;  Service: Cardiovascular;  Laterality: N/A;  . CORONARY/GRAFT ACUTE MI REVASCULARIZATION N/A 11/29/2017   Procedure: Coronary/Graft  Acute MI Revascularization;  Surgeon: Lyn Records, MD;  Location: [facility] INVASIVE CV LAB;  Service: Cardiovascular;  Laterality: N/A;  . LEFT HEART CATH AND CORONARY ANGIOGRAPHY N/A 11/29/2017   Procedure: LEFT HEART CATH AND CORONARY ANGIOGRAPHY;  Surgeon: Lyn Records, MD;  Location: [facility] INVASIVE CV LAB;  Service: Cardiovascular;  Laterality: N/A;  . LUMBAR DISC SURGERY     L5-S1    Current Medications: No outpatient medications have been marked as taking for the 04/20/21 encounter (Office Visit) with Lyn Records, MD.     Allergies:   Sulfa antibiotics   Social History   Socioeconomic History  . Marital status: Married    Spouse name: Not on file  . Number of children: 1  . Years of education: Not on file  . Highest education level: Not on file  Occupational History  . Occupation: Retail banker: [employer]  Tobacco Use  . Smoking status: Former Smoker    Packs/day: 0.50    Years: 20.00    Pack years: 10.00    Quit date: 11/28/2017    Years since quitting: 3.3  . Smokeless tobacco: Never Used  Vaping Use  . Vaping Use: Never used  Substance and Sexual Activity  . Alcohol use: Yes    Alcohol/week: 28.0 standard drinks    Types: 28 Cans of beer per week    Comment: 4 16 oz beers nightly last drink last night 02/13/21  . Drug use: No  .  Sexual activity: Yes    Comment: ptp states she cannot be pregnant, but not using anything to not get prenant  Other Topics Concern  . Not on file  Social History Narrative   Married with one child.   Social Determinants of Health   Financial Resource Strain: Not on file  Food Insecurity: Not on file  Transportation Needs: Not on file  Physical Activity: Not on file  Stress: Not on file  Social Connections: Not on file     Family History: The patient's family history includes Breast cancer (age of onset: 46) in her mother and sister; Heart disease in her brother; Other in her father. There is no history of Colon  cancer, Esophageal cancer, Rectal cancer, or Stomach cancer.  ROS:   Please see the history of present illness.    Back at work, doing fine.  Full physical activity.  No recurring episodes.  No weakness.  No limitations in physical abilities.  All other systems reviewed and are negative.  EKGs/Labs/Other Studies Reviewed:    The following studies were reviewed today:  ECHOCARDIOGRAM February 2022 IMPRESSIONS    1. Left ventricular ejection fraction, by estimation, is 45 to 50%. The  left ventricle has mildly decreased function. The left ventricle  demonstrates regional wall motion abnormalities (see scoring  diagram/findings for description). The left ventricular  internal cavity size was mildly dilated. Left ventricular diastolic  parameters were normal.  2. There is a filling defect in the LV apex on contrast images (best seen  on images 88-90), concerning for LV thrombus  3. Right ventricular systolic function is normal. The right ventricular  size is normal. Tricuspid regurgitation signal is inadequate for assessing  PA pressure.  4. The mitral valve is normal in structure. Trivial mitral valve  regurgitation.  5. The aortic valve was not well visualized. Aortic valve regurgitation  is not visualized. No aortic stenosis is present.    Nuclear myocardial perfusion study February 2022 IMPRESSION: 1. No reversible ischemia or infarction.  2. Normal left ventricular wall motion.  3. Left ventricular ejection fraction 52%. Of note, the end-diastolic and end systolic volumes are mildly abnormal.  4. Non invasive risk stratification*: Low  *2012 Appropriate Use Criteria for Coronary Revascularization Focused Update: J Am Coll Cardiol. 2012;59(9):857-881. http://content.dementiazones.com.aspx?articleid=1201161   Electronically Signed   By: Gerome Sam III M.D   On: 02/15/2021 15:32  30-day cardiac monitor February through March 2022 Study  Highlights    Basic underlying rhythm is normal sinus rhythm.  No significant burden of atrial or ventricular premature beats were noted.  No sustained tacky arrhythmias  Palpitations did not correlate with rhythm disturbance.   Overall normal study without malignant arrhythmias or bradycardia arrhythmias.    Carotid Doppler February 2022 Summary:  Right Carotid: Velocities in the right ICA are consistent with a 1-39%  stenosis.   Left Carotid: Velocities in the left ICA are consistent with a 1-39%  stenosis.   Vertebrals: Bilateral vertebral arteries demonstrate antegrade flow.     EKG:  EKG on March 12, 2021 demonstrated sinus bradycardia, PVC, and otherwise unremarkable.  Recent Labs: 11/17/2020: TSH 2.040 02/15/2021: ALT 29 02/16/2021: BUN 10; Creatinine, Ser 0.75; Hemoglobin 12.7; Platelets 265; Potassium 3.7; Sodium 137  Recent Lipid Panel    Component Value Date/Time   CHOL 104 02/15/2021 0500   CHOL 109 01/22/2020 1512   TRIG 158 (H) 02/15/2021 0500   HDL 38 (L) 02/15/2021 0500   HDL 46 01/22/2020 1512  CHOLHDL 2.7 02/15/2021 0500   VLDL 32 02/15/2021 0500   LDLCALC 34 02/15/2021 0500   LDLCALC 41 01/22/2020 1512    Physical Exam:    VS:  BP 100/60   Pulse 69   Ht 5\' 5"  (1.651 m)   Wt 159 lb (72.1 kg)   SpO2 98%   BMI 26.46 kg/m     Wt Readings from Last 3 Encounters:  04/20/21 159 lb (72.1 kg)  03/11/21 156 lb 9.6 oz (71 kg)  02/16/21 159 lb 9.8 oz (72.4 kg)     GEN: Slightly overweight. No acute distress HEENT: Normal NECK: No JVD. LYMPHATICS: No lymphadenopathy CARDIAC: No murmur. RRR no gallop, or edema. VASCULAR:  Normal Pulses. No bruits. RESPIRATORY:  Clear to auscultation without rales, wheezing or rhonchi  ABDOMEN: Soft, non-tender, non-distended, No pulsatile mass, MUSCULOSKELETAL: No deformity  SKIN: Warm and dry NEUROLOGIC:  Alert and oriented x 3 PSYCHIATRIC:  Normal affect   ASSESSMENT:    1. LV (left ventricular)  mural thrombus   2. Coronary artery disease involving native coronary artery of native heart without angina pectoris   3. HFmrEF (heart failure with mildly reduced EF)   4. Hyperlipidemia LDL goal <70    PLAN:    In order of problems listed above:  1. On Coumadin therapy.  LV thrombus is likely a coincidental finding in this patient who had anterior infarction in 2018.  Not knowing when it occurred, the team placed her on Coumadin therapy for at least 3 months.  Repeat an echo at the end of May early June timeframe and adjudicate anticoagulation therapy based upon findings.  If the same or smaller and certainly if has disappeared, will discontinue anticoagulation. 2. Continue aspirin 81 mg/day. 3. Continue Toprol-XL 25 mg/day and consider whether renin angiotensin system blockade would be reasonable.  She has had difficulty with medications due to lowish blood pressure. 4. Continue statin therapy  Continue physical activity as tolerated.  No limitations.  Determine if Coumadin anticoagulation can be stopped after 3 months of continuous Coumadin anticoagulation.  Echo will be done at the end of May.  Otherwise we will need to see her back in about 9 to 12 months.   Medication Adjustments/Labs and Tests Ordered: Current medicines are reviewed at length with the patient today.  Concerns regarding medicines are outlined above.  Orders Placed This Encounter  Procedures  . ECHOCARDIOGRAM LIMITED   No orders of the defined types were placed in this encounter.   Patient Instructions  Medication Instructions:  Your physician recommends that you continue on your current medications as directed. Please refer to the Current Medication list given to you today. *If you need a refill on your cardiac medications before your next appointment, please call your pharmacy*   Lab Work: None If you have labs (blood work) drawn today and your tests are completely normal, you will receive your results only  by: June MyChart Message (if you have MyChart) OR . A paper copy in the mail If you have any lab test that is abnormal or we need to change your treatment, we will call you to review the results.   Testing/Procedures: Your physician has requested that you have an limited echocardiogram at the end of May. Echocardiography is a painless test that uses sound waves to create images of your heart. It provides your doctor with information about the size and shape of your heart and how well your heart's chambers and valves are working. This procedure  takes approximately one hour. There are no restrictions for this procedure.     Follow-Up: At [facility], you and your health needs are our priority.  As part of our continuing mission to provide you with exceptional heart care, we have created designated Provider Care Teams.  These Care Teams include your primary Cardiologist (physician) and Advanced Practice Providers (APPs -  Physician Assistants and Nurse Practitioners) who all work together to provide you with the care you need, when you need it.  We recommend signing up for the patient portal called "MyChart".  Sign up information is provided on this After Visit Summary.  MyChart is used to connect with patients for Virtual Visits (Telemedicine).  Patients are able to view lab/test results, encounter notes, upcoming appointments, etc.  Non-urgent messages can be sent to your provider as well.   To learn more about what you can do with MyChart, go to [email or web address].    Your next appointment:   9-12 month(s)  The format for your next appointment:   In Person  Provider:   You may see Lesleigh Noe, MD or one of the following Advanced Practice Providers on your designated Care Team:    Georgie Chard, NP    Other Instructions      Signed, Lesleigh Noe, MD  04/20/2021 3:44 PM    [facility]

## 2021-04-20 ENCOUNTER — Ambulatory Visit: Payer: Federal, State, Local not specified - PPO | Admitting: Interventional Cardiology

## 2021-04-20 ENCOUNTER — Encounter: Payer: Self-pay | Admitting: Interventional Cardiology

## 2021-04-20 ENCOUNTER — Other Ambulatory Visit: Payer: Self-pay

## 2021-04-20 ENCOUNTER — Ambulatory Visit (INDEPENDENT_AMBULATORY_CARE_PROVIDER_SITE_OTHER): Payer: Federal, State, Local not specified - PPO | Admitting: *Deleted

## 2021-04-20 VITALS — BP 100/60 | HR 69 | Ht 65.0 in | Wt 159.0 lb

## 2021-04-20 DIAGNOSIS — I513 Intracardiac thrombosis, not elsewhere classified: Secondary | ICD-10-CM

## 2021-04-20 DIAGNOSIS — Z5181 Encounter for therapeutic drug level monitoring: Secondary | ICD-10-CM | POA: Diagnosis not present

## 2021-04-20 DIAGNOSIS — E785 Hyperlipidemia, unspecified: Secondary | ICD-10-CM

## 2021-04-20 DIAGNOSIS — I5020 Unspecified systolic (congestive) heart failure: Secondary | ICD-10-CM

## 2021-04-20 DIAGNOSIS — I2510 Atherosclerotic heart disease of native coronary artery without angina pectoris: Secondary | ICD-10-CM | POA: Diagnosis not present

## 2021-04-20 DIAGNOSIS — I5042 Chronic combined systolic (congestive) and diastolic (congestive) heart failure: Secondary | ICD-10-CM

## 2021-04-20 LAB — POCT INR: INR: 4 — AB (ref 2.0–3.0)

## 2021-04-20 NOTE — Patient Instructions (Addendum)
Medication Instructions:  Your physician recommends that you continue on your current medications as directed. Please refer to the Current Medication list given to you today. *If you need a refill on your cardiac medications before your next appointment, please call your pharmacy*   Lab Work: None If you have labs (blood work) drawn today and your tests are completely normal, you will receive your results only by: Marland Kitchen MyChart Message (if you have MyChart) OR . A paper copy in the mail If you have any lab test that is abnormal or we need to change your treatment, we will call you to review the results.   Testing/Procedures: Your physician has requested that you have an limited echocardiogram at the end of May. Echocardiography is a painless test that uses sound waves to create images of your heart. It provides your doctor with information about the size and shape of your heart and how well your heart's chambers and valves are working. This procedure takes approximately one hour. There are no restrictions for this procedure.     Follow-Up: At [facility], you and your health needs are our priority.  As part of our continuing mission to provide you with exceptional heart care, we have created designated Provider Care Teams.  These Care Teams include your primary Cardiologist (physician) and Advanced Practice Providers (APPs -  Physician Assistants and Nurse Practitioners) who all work together to provide you with the care you need, when you need it.  We recommend signing up for the patient portal called "MyChart".  Sign up information is provided on this After Visit Summary.  MyChart is used to connect with patients for Virtual Visits (Telemedicine).  Patients are able to view lab/test results, encounter notes, upcoming appointments, etc.  Non-urgent messages can be sent to your provider as well.   To learn more about what you can do with MyChart, go to [email or web address].    Your next  appointment:   9-12 month(s)  The format for your next appointment:   In Person  Provider:   You may see Lesleigh Noe, MD or one of the following Advanced Practice Providers on your designated Care Team:    Georgie Chard, NP    Other Instructions

## 2021-04-20 NOTE — Patient Instructions (Signed)
Description   - Hold warfarin today, then continue taking warfarin 1 tablet daily except for 1.5 tablets on MONDAYS AND FRIDAYS.  - Recheck INR in 2 weeks. Coumadin clinic [phone]

## 2021-04-29 ENCOUNTER — Encounter: Payer: Self-pay | Admitting: Family

## 2021-04-29 ENCOUNTER — Ambulatory Visit (INDEPENDENT_AMBULATORY_CARE_PROVIDER_SITE_OTHER): Payer: Federal, State, Local not specified - PPO | Admitting: Family

## 2021-04-29 ENCOUNTER — Other Ambulatory Visit: Payer: Self-pay

## 2021-04-29 VITALS — BP 117/74 | HR 63 | Temp 97.9°F | Ht 65.0 in | Wt 156.6 lb

## 2021-04-29 DIAGNOSIS — K219 Gastro-esophageal reflux disease without esophagitis: Secondary | ICD-10-CM | POA: Diagnosis not present

## 2021-04-29 DIAGNOSIS — E039 Hypothyroidism, unspecified: Secondary | ICD-10-CM | POA: Diagnosis not present

## 2021-04-29 DIAGNOSIS — M1612 Unilateral primary osteoarthritis, left hip: Secondary | ICD-10-CM

## 2021-04-29 DIAGNOSIS — Z720 Tobacco use: Secondary | ICD-10-CM

## 2021-04-29 DIAGNOSIS — I2510 Atherosclerotic heart disease of native coronary artery without angina pectoris: Secondary | ICD-10-CM

## 2021-04-29 DIAGNOSIS — F321 Major depressive disorder, single episode, moderate: Secondary | ICD-10-CM

## 2021-04-29 DIAGNOSIS — I2583 Coronary atherosclerosis due to lipid rich plaque: Secondary | ICD-10-CM

## 2021-04-29 DIAGNOSIS — F411 Generalized anxiety disorder: Secondary | ICD-10-CM

## 2021-04-29 DIAGNOSIS — I513 Intracardiac thrombosis, not elsewhere classified: Secondary | ICD-10-CM

## 2021-04-29 DIAGNOSIS — E785 Hyperlipidemia, unspecified: Secondary | ICD-10-CM

## 2021-04-29 NOTE — Patient Instructions (Signed)

## 2021-04-29 NOTE — Progress Notes (Signed)
Subjective:    Patient ID: Sarah Fox, female    DOB: 1966-09-16, 55 y.o.   MRN: [id number]  Chief Complaint  Patient presents with  . Medical Management of Chronic Issues   PT calls  the office today for chronic follow up. She is followed by Cardiologists every 9 months for CAD and STEMI in 2018.She is currently on warfarin for LV mural thrombus.  Congestive Heart Failure Presents for follow-up visit. Pertinent negatives include no edema. The symptoms have been stable.  Gastroesophageal Reflux She complains of belching and heartburn. This is a chronic problem. The current episode started more than 1 year ago. The problem occurs occasionally. The problem has been waxing and waning. The symptoms are aggravated by smoking. She has tried a PPI for the symptoms.  Thyroid Problem Presents for follow-up visit. Symptoms include depressed mood. Patient reports no anxiety, diarrhea or dry skin. The symptoms have been stable.  Arthritis Presents for follow-up visit. She complains of pain and stiffness. Affected locations include the left knee, right knee and right MCP. Her pain is at a severity of 1/10. Pertinent negatives include no diarrhea.  Depression        This is a chronic problem.  The current episode started more than 1 year ago.   The onset quality is gradual.   The problem occurs intermittently.  Associated symptoms include no helplessness, no hopelessness, not irritable and no restlessness.  Past treatments include SSRIs - Selective serotonin reuptake inhibitors.  Past medical history includes thyroid problem and anxiety.   Anxiety Presents for follow-up visit. Symptoms include depressed mood and irritability. Patient reports no nervous/anxious behavior or restlessness. Symptoms occur occasionally.        Review of Systems  Constitutional: Positive for irritability.  Gastrointestinal: Positive for heartburn. Negative for diarrhea.  Musculoskeletal: Positive for arthritis and  stiffness.  Psychiatric/Behavioral: Positive for depression. The patient is not nervous/anxious.   All other systems reviewed and are negative.      Objective:   Physical Exam Vitals reviewed.  Constitutional:      General: She is not irritable.She is not in acute distress.    Appearance: She is well-developed.  HENT:     Head: Normocephalic and atraumatic.     Right Ear: Tympanic membrane normal.     Left Ear: Tympanic membrane normal.  Eyes:     Pupils: Pupils are equal, round, and reactive to light.  Neck:     Thyroid: No thyromegaly.  Cardiovascular:     Rate and Rhythm: Normal rate and regular rhythm.     Heart sounds: Normal heart sounds. No murmur heard.   Pulmonary:     Effort: Pulmonary effort is normal. No respiratory distress.     Breath sounds: Normal breath sounds. No wheezing.  Abdominal:     General: Bowel sounds are normal. There is no distension.     Palpations: Abdomen is soft.     Tenderness: There is no abdominal tenderness.  Musculoskeletal:        General: Tenderness present.     Cervical back: Normal range of motion and neck supple.     Comments: Positive tinel and Phalen test  Skin:    General: Skin is warm and dry.  Neurological:     Mental Status: She is alert and oriented to person, place, and time.     Cranial Nerves: No cranial nerve deficit.     Deep Tendon Reflexes: Reflexes are normal and symmetric.  Psychiatric:  Behavior: Behavior normal.        Thought Content: Thought content normal.        Judgment: Judgment normal.       BP 117/74   Pulse 63   Temp 97.9 F (36.6 C)   Ht $R'5\' 5"'ox$  (1.651 m)   Wt 156 lb 9.6 oz (71 kg)   BMI 26.06 kg/m      Assessment & Plan:  Sarah Fox comes in today with chief complaint of Medical Management of Chronic Issues   Diagnosis and orders addressed:  1. CAD in native artery - CMP14+EGFR - CBC with Differential/Platelet  2. Coronary artery disease due to lipid rich plaque -  CMP14+EGFR - CBC with Differential/Platelet  3. Gastroesophageal reflux disease, unspecified whether esophagitis present - CMP14+EGFR - CBC with Differential/Platelet  4. Hypothyroidism, unspecified type - CMP14+EGFR - CBC with Differential/Platelet  5. Primary osteoarthritis of left hip - CMP14+EGFR - CBC with Differential/Platelet  6. Depression, major, single episode, moderate (HCC) - CMP14+EGFR - CBC with Differential/Platelet  7. GAD (generalized anxiety disorder) - CMP14+EGFR - CBC with Differential/Platelet  8. Hyperlipidemia LDL goal <70 - CMP14+EGFR - CBC with Differential/Platelet  9. Tobacco use - CMP14+EGFR - CBC with Differential/Platelet  10. LV (left ventricular) mural thrombus - CMP14+EGFR - CBC with Differential/Platelet   Labs pending Health Maintenance reviewed Diet and exercise encouraged  Follow up plan: 6 months for pap   Evelina Dun, FNP

## 2021-04-30 LAB — CMP14+EGFR
ALT: 21 IU/L (ref 0–32)
AST: 19 IU/L (ref 0–40)
Albumin/Globulin Ratio: 1.9 (ref 1.2–2.2)
Albumin: 4.8 g/dL (ref 3.8–4.9)
Alkaline Phosphatase: 84 IU/L (ref 44–121)
BUN/Creatinine Ratio: 12 (ref 9–23)
BUN: 10 mg/dL (ref 6–24)
Bilirubin Total: 0.4 mg/dL (ref 0.0–1.2)
CO2: 23 mmol/L (ref 20–29)
Calcium: 9.3 mg/dL (ref 8.7–10.2)
Chloride: 106 mmol/L (ref 96–106)
Creatinine, Ser: 0.86 mg/dL (ref 0.57–1.00)
Globulin, Total: 2.5 g/dL (ref 1.5–4.5)
Glucose: 87 mg/dL (ref 65–99)
Potassium: 4 mmol/L (ref 3.5–5.2)
Sodium: 142 mmol/L (ref 134–144)
Total Protein: 7.3 g/dL (ref 6.0–8.5)
eGFR: 81 mL/min/{1.73_m2} (ref >59)

## 2021-04-30 LAB — CBC WITH DIFFERENTIAL/PLATELET
Basophils Absolute: 0 10*3/uL (ref 0.0–0.2)
Basos: 0 %
EOS (ABSOLUTE): 0.2 10*3/uL (ref 0.0–0.4)
Eos: 3 %
Hematocrit: 41.3 % (ref 34.0–46.6)
Hemoglobin: 13.5 g/dL (ref 11.1–15.9)
Immature Grans (Abs): 0 10*3/uL (ref 0.0–0.1)
Immature Granulocytes: 0 %
Lymphocytes Absolute: 2.5 10*3/uL (ref 0.7–3.1)
Lymphs: 36 %
MCH: 29.7 pg (ref 26.6–33.0)
MCHC: 32.7 g/dL (ref 31.5–35.7)
MCV: 91 fL (ref 79–97)
Monocytes Absolute: 0.5 10*3/uL (ref 0.1–0.9)
Monocytes: 7 %
Neutrophils Absolute: 3.7 10*3/uL (ref 1.4–7.0)
Neutrophils: 54 %
Platelets: 336 10*3/uL (ref 150–450)
RBC: 4.55 x10E6/uL (ref 3.77–5.28)
RDW: 13.6 % (ref 11.7–15.4)
WBC: 6.9 10*3/uL (ref 3.4–10.8)

## 2021-05-04 ENCOUNTER — Ambulatory Visit: Payer: Federal, State, Local not specified - PPO | Admitting: Physician Assistant

## 2021-05-04 ENCOUNTER — Ambulatory Visit (INDEPENDENT_AMBULATORY_CARE_PROVIDER_SITE_OTHER): Payer: Federal, State, Local not specified - PPO | Admitting: *Deleted

## 2021-05-04 ENCOUNTER — Other Ambulatory Visit: Payer: Self-pay

## 2021-05-04 ENCOUNTER — Encounter: Payer: Self-pay | Admitting: Physician Assistant

## 2021-05-04 ENCOUNTER — Other Ambulatory Visit: Payer: Self-pay | Admitting: Interventional Cardiology

## 2021-05-04 VITALS — BP 120/68 | HR 66 | Ht 65.0 in | Wt 160.0 lb

## 2021-05-04 DIAGNOSIS — R1310 Dysphagia, unspecified: Secondary | ICD-10-CM | POA: Diagnosis not present

## 2021-05-04 DIAGNOSIS — Z5181 Encounter for therapeutic drug level monitoring: Secondary | ICD-10-CM | POA: Diagnosis not present

## 2021-05-04 DIAGNOSIS — I513 Intracardiac thrombosis, not elsewhere classified: Secondary | ICD-10-CM

## 2021-05-04 DIAGNOSIS — K219 Gastro-esophageal reflux disease without esophagitis: Secondary | ICD-10-CM

## 2021-05-04 LAB — POCT INR: INR: 2.3 (ref 2.0–3.0)

## 2021-05-04 MED ORDER — FAMOTIDINE 20 MG PO TABS: 20.0000 mg | ORAL_TABLET | Freq: Two times a day (BID) | ORAL | 5 refills | Status: DC

## 2021-05-04 NOTE — Patient Instructions (Signed)
If you are age 53 or older, your body mass index should be between 23-30. Your Body mass index is 26.63 kg/m. If this is out of the aforementioned range listed, please consider follow up with your Primary Care Provider.  If you are age 57 or younger, your body mass index should be between 19-25. Your Body mass index is 26.63 kg/m. If this is out of the aformentioned range listed, please consider follow up with your Primary Care Provider.   You have been scheduled for a Barium Esophogram at [facility] (1st floor of the hospital) on 05/18/21 at 9:30am. Please arrive 15 minutes prior to your appointment for registration. Make certain not to have anything to eat or drink 3 hours prior to your test. If you need to reschedule for any reason, please contact radiology at [phone] to do so. __________________________________________________________________ A barium swallow is an examination that concentrates on views of the esophagus. This tends to be a double contrast exam (barium and two liquids which, when combined, create a gas to distend the wall of the oesophagus) or single contrast (non-ionic iodine based). The study is usually tailored to your symptoms so a good history is essential. Attention is paid during the study to the form, structure and configuration of the esophagus, looking for functional disorders (such as aspiration, dysphagia, achalasia, motility and reflux) EXAMINATION You may be asked to change into a gown, depending on the type of swallow being performed. A radiologist and radiographer will perform the procedure. The radiologist will advise you of the type of contrast selected for your procedure and direct you during the exam. You will be asked to stand, sit or lie in several different positions and to hold a small amount of fluid in your mouth before being asked to swallow while the imaging is performed .In some instances you may be asked to swallow barium coated marshmallows  to assess the motility of a solid food bolus. The exam can be recorded as a digital or video fluoroscopy procedure. POST PROCEDURE It will take 1-2 days for the barium to pass through your system. To facilitate this, it is important, unless otherwise directed, to increase your fluids for the next 24-48hrs and to resume your normal diet.  This test typically takes about 30 minutes to perform. __________________________________________________________________________________  Due to recent changes in healthcare laws, you may see the results of your imaging and laboratory studies on MyChart before your provider has had a chance to review them.  We understand that in some cases there may be results that are confusing or concerning to you. Not all laboratory results come back in the same time frame and the provider may be waiting for multiple results in order to interpret others.  Please give [place] 48 hours in order for your provider to thoroughly review all the results before contacting the office for clarification of your results.   We have sent the following medications to your pharmacy for you to pick up at your convenience: Pepcid 20 mg .  Thank you for choosing me and [facility].  Hyacinth Meeker, PA-C

## 2021-05-04 NOTE — Progress Notes (Signed)
Chief Complaint: Epigastric pain, GERD  HPI:    Sarah Fox is a 55 year old female with a past medical history as listed below including STEMI and CAD, known to Dr. Rhea Belton who was referred to me by Junie Spencer, FNP for a complaint of epigastric pain and GERD.      04/17/2012 EGD for dyspepsia showed an irregular Z-line and otherwise normal.  Pathology showed chronic gastritis and chronically inflamed GE junction.  Patient continued on omeprazole.    04/20/2021 patient seen by cardiology for ongoing a left ventricular mural thrombus for which she is on Coumadin started in February.  It was noted that plans were to keep her on this for at least 3 months and then recheck echo to see how the thrombus was doing and decide on further anticoagulation at that point.  Looks like this is scheduled at the end of May.    04/29/2021 patient seen by PCP and at that time discussed some GERD.  Labs including a CMP and CBC were normal.    Today, the patient tells me that she has been having some pain that radiates into the top of her left shoulder and into her back and below her rib which started around February of this year when she was diagnosed with ventricular mural thrombus and in the hospital, she thought this was all just from her heart so did not think much of it but recently when describing it to her cardiologist who suggested that she check in with [place].  Tells me that they had increased her Pantoprazole to 40 mg twice daily back in February but this does not seem to have made much difference.  Does describe daily regurgitation of food and acid.  Tells me that most recently over the past few weeks she has also noticed that she "chokes easy", even on liquids.    Also tells me that she lost her taste as of August 23 of last year and no one can figure this out.  Apparently went to see the ENT who had no other suggestions.  Tells me she is not aware that she has ever had COVID.    Denies fever, chills, weight loss  or blood in her stool.  Past Medical History:  Diagnosis Date  . Acute ST elevation myocardial infarction (STEMI) of anterior wall (HCC)   . Allergy   . CAD in native artery   . Cardiac arrest with ventricular fibrillation (HCC) 10/2017   with acute MI and hypokalemia  . Coronary artery disease   . GERD (gastroesophageal reflux disease)   . Ischemic cardiomyopathy 10/2017  . Myocardial infarction acute (HCC) 10/2017  . Thyroid disease    hypo  . VF (ventricular fibrillation) [facility])     Past Surgical History:  Procedure Laterality Date  . CORONARY STENT INTERVENTION N/A 11/29/2017   Procedure: CORONARY STENT INTERVENTION;  Surgeon: Lyn Records, MD;  Location: [facility] INVASIVE CV LAB;  Service: Cardiovascular;  Laterality: N/A;  . CORONARY/GRAFT ACUTE MI REVASCULARIZATION N/A 11/29/2017   Procedure: Coronary/Graft Acute MI Revascularization;  Surgeon: Lyn Records, MD;  Location: [facility] INVASIVE CV LAB;  Service: Cardiovascular;  Laterality: N/A;  . LEFT HEART CATH AND CORONARY ANGIOGRAPHY N/A 11/29/2017   Procedure: LEFT HEART CATH AND CORONARY ANGIOGRAPHY;  Surgeon: Lyn Records, MD;  Location: [facility] INVASIVE CV LAB;  Service: Cardiovascular;  Laterality: N/A;  . LUMBAR DISC SURGERY     L5-S1    Current Outpatient Medications  Medication Sig Dispense  Refill  . aspirin EC 81 MG tablet Take 81 mg by mouth daily. Swallow whole.    Marland Kitchen atorvastatin (LIPITOR) 40 MG tablet TAKE 1 TABLET BY MOUTH EVERY DAY 90 tablet 3  . escitalopram (LEXAPRO) 20 MG tablet Take 1 tablet (20 mg total) by mouth daily. 30 tablet 0  . famotidine (PEPCID) 20 MG tablet Take 1 tablet (20 mg total) by mouth 2 (two) times daily. Take one tablet in the morning and one tablet at bedtime. 60 tablet 5  . fluticasone (FLONASE) 50 MCG/ACT nasal spray SPRAY 2 SPRAYS INTO EACH NOSTRIL EVERY DAY 48 mL 2  . levothyroxine (SYNTHROID) 88 MCG tablet TAKE 1 TABLET BY MOUTH EVERY DAY 90 tablet 1  . metoprolol succinate (TOPROL-XL) 25 MG  24 hr tablet TAKE 1 TABLET BY MOUTH EVERY DAY 90 tablet 3  . nitroGLYCERIN (NITROSTAT) 0.4 MG SL tablet Place 1 tablet (0.4 mg total) under the tongue every 5 (five) minutes as needed for chest pain. 25 tablet 3  . pantoprazole (PROTONIX) 40 MG tablet Take 1 tablet (40 mg total) by mouth 2 (two) times daily. 180 tablet 2  . warfarin (COUMADIN) 5 MG tablet TAKE 1 TABLET TO 1 AND 1/2 TABLET BY MOUTH DAILY AS DIRECTED BY THE COUMADIN CLINIC. 40 tablet 1   No current facility-administered medications for this visit.    Allergies as of 05/04/2021 - Review Complete 05/04/2021  Allergen Reaction Noted  . Sulfa antibiotics Rash 05/09/2020    Family History  Problem Relation Age of Onset  . Breast cancer Mother 3  . Breast cancer Sister 83  . Heart disease Brother   . Other Father   . Colon cancer Neg Hx   . Esophageal cancer Neg Hx   . Rectal cancer Neg Hx   . Stomach cancer Neg Hx     Social History   Socioeconomic History  . Marital status: Married    Spouse name: Not on file  . Number of children: 1  . Years of education: Not on file  . Highest education level: Not on file  Occupational History  . Occupation: Retail banker: [employer]  Tobacco Use  . Smoking status: Former Smoker    Packs/day: 0.50    Years: 20.00    Pack years: 10.00    Quit date: 11/28/2017    Years since quitting: 3.4  . Smokeless tobacco: Never Used  Vaping Use  . Vaping Use: Never used  Substance and Sexual Activity  . Alcohol use: Yes    Alcohol/week: 28.0 standard drinks    Types: 28 Cans of beer per week    Comment: 4 16 oz beers nightly last drink last night 02/13/21  . Drug use: No  . Sexual activity: Yes    Comment: ptp states she cannot be pregnant, but not using anything to not get prenant  Other Topics Concern  . Not on file  Social History Narrative   Married with one child.   Social Determinants of Health   Financial Resource Strain: Not on file  Food Insecurity:  Not on file  Transportation Needs: Not on file  Physical Activity: Not on file  Stress: Not on file  Social Connections: Not on file  Intimate Partner Violence: Not on file    Review of Systems:    Constitutional: No weight loss, fever or chills Skin: No rash Cardiovascular: No chest pain Respiratory: No SOB  Gastrointestinal: See HPI and otherwise negative Genitourinary: No  dysuria  Neurological: No headache, dizziness or syncope Musculoskeletal: No new muscle or joint pain Hematologic: No bleeding  Psychiatric: No history of depression or anxiety   Physical Exam:  Vital signs: BP 120/68   Pulse 66   Ht 5\' 5"  (1.651 m)   Wt 160 lb (72.6 kg)   BMI 26.63 kg/m   Constitutional:   Pleasant Caucasian female appears to be in NAD, Well developed, Well nourished, alert and cooperative Head:  Normocephalic and atraumatic. Eyes:   PEERL, EOMI. No icterus. Conjunctiva pink. Ears:  Normal auditory acuity. Neck:  Supple Throat: Oral cavity and pharynx without inflammation, swelling or lesion.  Respiratory: Respirations even and unlabored. Lungs clear to auscultation bilaterally.   No wheezes, crackles, or rhonchi.  Cardiovascular: Normal S1, S2. No MRG. Regular rate and rhythm. No peripheral edema, cyanosis or pallor.  Gastrointestinal:  Soft, nondistended, nontender. No rebound or guarding. Normal bowel sounds. No appreciable masses or hepatomegaly. Rectal:  Not performed.  Msk:  Symmetrical without gross deformities. Without edema, no deformity or joint abnormality.  Neurologic:  Alert and  oriented x4;  grossly normal neurologically.  Skin:   Dry and intact without significant lesions or rashes. Psychiatric: Demonstrates good judgement and reason without abnormal affect or behaviors.  RELEVANT LABS AND IMAGING: CBC    Component Value Date/Time   WBC 6.9 04/29/2021 1249   WBC 9.1 02/16/2021 0012   RBC 4.55 04/29/2021 1249   RBC 4.10 02/16/2021 0012   HGB 13.5 04/29/2021 1249    HCT 41.3 04/29/2021 1249   PLT 336 04/29/2021 1249   MCV 91 04/29/2021 1249   MCH 29.7 04/29/2021 1249   MCH 31.0 02/16/2021 0012   MCHC 32.7 04/29/2021 1249   MCHC 34.0 02/16/2021 0012   RDW 13.6 04/29/2021 1249   LYMPHSABS 2.5 04/29/2021 1249   EOSABS 0.2 04/29/2021 1249   BASOSABS 0.0 04/29/2021 1249    CMP     Component Value Date/Time   NA 142 04/29/2021 1249   K 4.0 04/29/2021 1249   CL 106 04/29/2021 1249   CO2 23 04/29/2021 1249   GLUCOSE 87 04/29/2021 1249   GLUCOSE 119 (H) 02/16/2021 0811   BUN 10 04/29/2021 1249   CREATININE 0.86 04/29/2021 1249   CALCIUM 9.3 04/29/2021 1249   PROT 7.3 04/29/2021 1249   ALBUMIN 4.8 04/29/2021 1249   AST 19 04/29/2021 1249   ALT 21 04/29/2021 1249   ALKPHOS 84 04/29/2021 1249   BILITOT 0.4 04/29/2021 1249   GFRNONAA >60 02/16/2021 0811   GFRAA 96 06/20/2020 1549    Assessment: 1.  Epigastric pain: Patient initially thought related to her heart, but cardiology wanted her to check with 06/22/2020, does have daily reflux symptoms, previous EGD in 2013 with chronic gastritis; consider gastritis+/-esophagitis versus other 2.  GERD 3.  Recently diagnosed left ventricular thrombus: On Coumadin  Plan: 1.  Patient would likely benefit from an EGD but given that she was recently started on Coumadin in February and has not had a recheck echo yet we will need to hold on this procedure.  She may be able to have it done pending recommendations from cardiology in the beginning of June.  For now scheduled the patient for a barium esophagram with tablet for further evaluation of her dysphagia symptoms. 2.  Continue Pantoprazole 40 twice daily, 30-60 minutes before breakfast and dinner. 3.  Added Pepcid 20 mg twice daily, every morning and nightly #60 with 5 refills. 4.  Patient to follow in  clinic per recommendations after time of barium swallow.  Hyacinth Meeker, PA-C [facility] 05/04/2021, 9:54 AM  Cc: Junie Spencer, FNP

## 2021-05-04 NOTE — Patient Instructions (Signed)
Description   Continue taking warfarin 1 tablet daily except for 1.5 tablets on MONDAYS AND FRIDAYS.  Recheck INR in 2 weeks after Echo appt. Coumadin clinic [phone]

## 2021-05-12 ENCOUNTER — Other Ambulatory Visit: Payer: Self-pay | Admitting: Family

## 2021-05-12 DIAGNOSIS — F321 Major depressive disorder, single episode, moderate: Secondary | ICD-10-CM

## 2021-05-12 DIAGNOSIS — F411 Generalized anxiety disorder: Secondary | ICD-10-CM

## 2021-05-18 ENCOUNTER — Other Ambulatory Visit: Payer: Self-pay

## 2021-05-18 ENCOUNTER — Ambulatory Visit (HOSPITAL_BASED_OUTPATIENT_CLINIC_OR_DEPARTMENT_OTHER): Payer: Federal, State, Local not specified - PPO

## 2021-05-18 ENCOUNTER — Ambulatory Visit (HOSPITAL_COMMUNITY)
Admission: RE | Admit: 2021-05-18 | Discharge: 2021-05-18 | Disposition: A | Discharge status: Home / Self Care | Payer: Federal, State, Local not specified - PPO | Source: Ambulatory Visit | Attending: Physician Assistant | Admitting: Physician Assistant

## 2021-05-18 ENCOUNTER — Ambulatory Visit (INDEPENDENT_AMBULATORY_CARE_PROVIDER_SITE_OTHER): Payer: Federal, State, Local not specified - PPO | Admitting: *Deleted

## 2021-05-18 DIAGNOSIS — I513 Intracardiac thrombosis, not elsewhere classified: Secondary | ICD-10-CM | POA: Diagnosis not present

## 2021-05-18 DIAGNOSIS — Z5181 Encounter for therapeutic drug level monitoring: Secondary | ICD-10-CM | POA: Diagnosis not present

## 2021-05-18 DIAGNOSIS — R1310 Dysphagia, unspecified: Secondary | ICD-10-CM | POA: Insufficient documentation

## 2021-05-18 DIAGNOSIS — K219 Gastro-esophageal reflux disease without esophagitis: Secondary | ICD-10-CM | POA: Diagnosis not present

## 2021-05-18 DIAGNOSIS — K449 Diaphragmatic hernia without obstruction or gangrene: Secondary | ICD-10-CM | POA: Diagnosis not present

## 2021-05-18 LAB — ECHOCARDIOGRAM LIMITED
Area-P 1/2: 3.12 cm2
Calc EF: 46.3 %
S' Lateral: 4.5 cm
Single Plane A2C EF: 46.8 %
Single Plane A4C EF: 48.1 %

## 2021-05-18 LAB — POCT INR: INR: 3.8 — AB (ref 2.0–3.0)

## 2021-05-18 MED ORDER — PERFLUTREN LIPID MICROSPHERE
1.0000 mL | INTRAVENOUS | Status: AC | PRN
  Administered 2021-05-18: 2 mL via INTRAVENOUS

## 2021-05-18 NOTE — Progress Notes (Signed)
Patient ID: Sarah Fox, female   DOB: [birth year], 55 y.o.   MRN: [id number]  Echocardiogram 2D Echocardiogram has been performed.  Tye Savoy 05/18/21

## 2021-05-18 NOTE — Patient Instructions (Signed)
Description   Do not take any Warfarin today then continue taking warfarin 1 tablet daily except for 1.5 tablets on MONDAYS AND FRIDAYS. Recheck INR in 2 weeks. Coumadin clinic [phone]

## 2021-05-21 ENCOUNTER — Telehealth: Payer: Self-pay | Admitting: Interventional Cardiology

## 2021-05-21 NOTE — Telephone Encounter (Signed)
Follow Up:     Pt said she saw her Echo results on My Chart. She said she need somebody to explain the results to her please.

## 2021-05-21 NOTE — Telephone Encounter (Signed)
Spoke with pt and made her aware that I am waiting for Dr. Katrinka Blazing to do his final interpretation and give recommendations.  Advised I will be in touch as soon as he does.  Pt appreciative for call.

## 2021-05-22 NOTE — Telephone Encounter (Signed)
Sarah Fox is calling to check on the status of this message due to not receiving a response back. She states if you prefer to callback you can reach her at [phone]. Advised a my chart response works as well. Please advise.

## 2021-05-22 NOTE — Progress Notes (Signed)
Addendum: Reviewed and agree with assessment and management plan. Barium esophagram did not show stricture.  Agree with plans per result note from [place], [facility] We need to ensure CRC screening has been offered if she has never been screened. Thanks JMP Nyima Vanacker, Carie Caddy, MD

## 2021-05-27 MED ORDER — CLOPIDOGREL BISULFATE 75 MG PO TABS: 75.0000 mg | ORAL_TABLET | Freq: Every day | ORAL | 3 refills | Status: DC

## 2021-06-02 ENCOUNTER — Other Ambulatory Visit: Payer: Self-pay | Admitting: Interventional Cardiology

## 2021-06-02 NOTE — Telephone Encounter (Signed)
Warfarin d/c'd on 05/18/2021 after echo results. It has been d/c'd from med list therefore, will deny refill and note to pharmacy.

## 2021-07-04 DIAGNOSIS — R0981 Nasal congestion: Secondary | ICD-10-CM | POA: Diagnosis not present

## 2021-07-04 DIAGNOSIS — R0989 Other specified symptoms and signs involving the circulatory and respiratory systems: Secondary | ICD-10-CM | POA: Diagnosis not present

## 2021-07-04 DIAGNOSIS — J321 Chronic frontal sinusitis: Secondary | ICD-10-CM | POA: Diagnosis not present

## 2021-07-20 ENCOUNTER — Ambulatory Visit: Payer: Federal, State, Local not specified - PPO | Admitting: Physician Assistant

## 2021-08-15 ENCOUNTER — Other Ambulatory Visit: Payer: Self-pay | Admitting: Family

## 2021-08-15 ENCOUNTER — Other Ambulatory Visit: Payer: Self-pay | Admitting: Interventional Cardiology

## 2021-08-15 DIAGNOSIS — F321 Major depressive disorder, single episode, moderate: Secondary | ICD-10-CM

## 2021-08-15 DIAGNOSIS — F411 Generalized anxiety disorder: Secondary | ICD-10-CM

## 2021-08-16 ENCOUNTER — Other Ambulatory Visit: Payer: Self-pay | Admitting: Family

## 2021-08-17 ENCOUNTER — Other Ambulatory Visit: Payer: Self-pay | Admitting: Interventional Cardiology

## 2021-08-18 NOTE — Telephone Encounter (Signed)
Please advise on refill request as Tereso Newcomer changed this to BID but also placed a GI referral for the patient. She has already had a visit with GI since last seen here. Okay to refill or defer to GI to be addressed? Please advise. Thanks, MI

## 2021-08-25 ENCOUNTER — Other Ambulatory Visit: Payer: Self-pay | Admitting: Family

## 2021-08-25 DIAGNOSIS — Z1231 Encounter for screening mammogram for malignant neoplasm of breast: Secondary | ICD-10-CM

## 2021-10-02 ENCOUNTER — Other Ambulatory Visit: Payer: Self-pay

## 2021-10-02 ENCOUNTER — Ambulatory Visit
Admission: RE | Admit: 2021-10-02 | Discharge: 2021-10-02 | Disposition: A | Discharge status: Home / Self Care | Payer: Federal, State, Local not specified - PPO | Source: Ambulatory Visit | Attending: Family | Admitting: Family

## 2021-10-02 DIAGNOSIS — Z1231 Encounter for screening mammogram for malignant neoplasm of breast: Secondary | ICD-10-CM

## 2021-10-25 ENCOUNTER — Other Ambulatory Visit: Payer: Self-pay | Admitting: Family

## 2021-10-25 ENCOUNTER — Other Ambulatory Visit: Payer: Self-pay | Admitting: Interventional Cardiology

## 2021-10-25 DIAGNOSIS — H9312 Tinnitus, left ear: Secondary | ICD-10-CM

## 2021-10-27 NOTE — Telephone Encounter (Signed)
Rx(s) sent to pharmacy electronically.  

## 2021-11-14 ENCOUNTER — Other Ambulatory Visit: Payer: Self-pay | Admitting: Family

## 2021-11-14 DIAGNOSIS — F411 Generalized anxiety disorder: Secondary | ICD-10-CM

## 2021-11-14 DIAGNOSIS — F321 Major depressive disorder, single episode, moderate: Secondary | ICD-10-CM

## 2021-11-14 DIAGNOSIS — H9312 Tinnitus, left ear: Secondary | ICD-10-CM

## 2021-12-09 ENCOUNTER — Other Ambulatory Visit: Payer: Self-pay | Admitting: Family

## 2021-12-09 DIAGNOSIS — F411 Generalized anxiety disorder: Secondary | ICD-10-CM

## 2021-12-09 DIAGNOSIS — F321 Major depressive disorder, single episode, moderate: Secondary | ICD-10-CM

## 2022-01-07 ENCOUNTER — Telehealth: Payer: Self-pay

## 2022-01-07 NOTE — Telephone Encounter (Signed)
**Note De-Identified Isley Weisheit Obfuscation** I started a Pantoprazole PA through covermymeds. Key: [id number]

## 2022-01-11 ENCOUNTER — Encounter: Payer: Self-pay | Admitting: Family

## 2022-01-11 ENCOUNTER — Other Ambulatory Visit: Payer: Self-pay | Admitting: Family

## 2022-01-11 NOTE — Telephone Encounter (Signed)
Patient needs to be seen for any further refills. Thirty day supply given

## 2022-01-11 NOTE — Telephone Encounter (Signed)
Lmtcb. Mailed letter 

## 2022-01-14 ENCOUNTER — Other Ambulatory Visit: Payer: Self-pay | Admitting: Physician Assistant

## 2022-01-14 ENCOUNTER — Other Ambulatory Visit: Payer: Self-pay | Admitting: Family

## 2022-01-14 DIAGNOSIS — F321 Major depressive disorder, single episode, moderate: Secondary | ICD-10-CM

## 2022-01-14 DIAGNOSIS — F411 Generalized anxiety disorder: Secondary | ICD-10-CM

## 2022-01-14 DIAGNOSIS — H9312 Tinnitus, left ear: Secondary | ICD-10-CM

## 2022-01-19 ENCOUNTER — Encounter: Payer: Self-pay | Admitting: Family

## 2022-01-19 ENCOUNTER — Ambulatory Visit: Payer: Federal, State, Local not specified - PPO | Admitting: Family

## 2022-01-19 VITALS — BP 131/84 | HR 69 | Temp 97.0°F | Ht 65.0 in | Wt 154.2 lb

## 2022-01-19 DIAGNOSIS — I2510 Atherosclerotic heart disease of native coronary artery without angina pectoris: Secondary | ICD-10-CM

## 2022-01-19 DIAGNOSIS — E785 Hyperlipidemia, unspecified: Secondary | ICD-10-CM

## 2022-01-19 DIAGNOSIS — E039 Hypothyroidism, unspecified: Secondary | ICD-10-CM

## 2022-01-19 DIAGNOSIS — I2583 Coronary atherosclerosis due to lipid rich plaque: Secondary | ICD-10-CM | POA: Diagnosis not present

## 2022-01-19 DIAGNOSIS — F321 Major depressive disorder, single episode, moderate: Secondary | ICD-10-CM

## 2022-01-19 DIAGNOSIS — K219 Gastro-esophageal reflux disease without esophagitis: Secondary | ICD-10-CM | POA: Diagnosis not present

## 2022-01-19 DIAGNOSIS — I5042 Chronic combined systolic (congestive) and diastolic (congestive) heart failure: Secondary | ICD-10-CM

## 2022-01-19 DIAGNOSIS — Z87891 Personal history of nicotine dependence: Secondary | ICD-10-CM

## 2022-01-19 DIAGNOSIS — F411 Generalized anxiety disorder: Secondary | ICD-10-CM

## 2022-01-19 DIAGNOSIS — M1612 Unilateral primary osteoarthritis, left hip: Secondary | ICD-10-CM

## 2022-01-19 NOTE — Patient Instructions (Signed)

## 2022-01-19 NOTE — Progress Notes (Signed)
Subjective:    Patient ID: Sarah Fox, female    DOB: 1966/02/06, 56 y.o.   MRN: [id number]  Chief Complaint  Patient presents with   Medical Management of Chronic Issues   PT calls  the office today for chronic follow up. She is followed by Cardiologists every 9 months for CAD and STEMI in 2018. She is currently on warfarin for LV mural thrombus.  Congestive Heart Failure Presents for follow-up visit. Pertinent negatives include no edema, fatigue, muscle weakness, orthopnea, palpitations or unexpected weight change. The symptoms have been stable.  Gastroesophageal Reflux She complains of belching and heartburn. This is a chronic problem. The current episode started more than 1 year ago. The problem occurs occasionally. The problem has been waxing and waning. Pertinent negatives include no fatigue, muscle weakness or orthopnea. She has tried a PPI for the symptoms. The treatment provided moderate relief.  Depression        This is a chronic problem.  The current episode started more than 1 year ago.   Associated symptoms include no fatigue, no helplessness, no hopelessness, not irritable, no restlessness and not sad.  Past treatments include SSRIs - Selective serotonin reuptake inhibitors.  Past medical history includes thyroid problem and anxiety.   Anxiety Presents for follow-up visit. Patient reports no depressed mood, excessive worry, irritability, nervous/anxious behavior, palpitations or restlessness. Symptoms occur rarely.    Hyperlipidemia This is a chronic problem. The current episode started more than 1 year ago. Exacerbating diseases include obesity. Current antihyperlipidemic treatment includes statins. The current treatment provides moderate improvement of lipids. Risk factors for coronary artery disease include dyslipidemia and a sedentary lifestyle.  Arthritis Presents for follow-up visit. She complains of pain and stiffness. The symptoms have been stable. Affected  locations include the left shoulder, right shoulder, left MCP and right MCP. Her pain is at a severity of 3/10. Associated symptoms include diarrhea. Pertinent negatives include no fatigue.  Thyroid Problem Presents for follow-up visit. Symptoms include constipation and diarrhea. Patient reports no anxiety, depressed mood, fatigue or palpitations. The symptoms have been stable. Her past medical history is significant for hyperlipidemia.     Review of Systems  Constitutional:  Negative for fatigue, irritability and unexpected weight change.  Cardiovascular:  Negative for palpitations.  Gastrointestinal:  Positive for constipation, diarrhea and heartburn.  Musculoskeletal:  Positive for arthritis and stiffness. Negative for muscle weakness.  Psychiatric/Behavioral:  Positive for depression. The patient is not nervous/anxious.   All other systems reviewed and are negative.     Objective:   Physical Exam Vitals reviewed.  Constitutional:      General: She is not irritable.She is not in acute distress.    Appearance: She is well-developed.  HENT:     Head: Normocephalic and atraumatic.     Right Ear: Tympanic membrane normal.     Left Ear: Tympanic membrane normal.  Eyes:     Pupils: Pupils are equal, round, and reactive to light.  Neck:     Thyroid: No thyromegaly.  Cardiovascular:     Rate and Rhythm: Normal rate and regular rhythm.     Heart sounds: Normal heart sounds. No murmur heard. Pulmonary:     Effort: Pulmonary effort is normal. No respiratory distress.     Breath sounds: Normal breath sounds. No wheezing.  Abdominal:     General: Bowel sounds are normal. There is no distension.     Palpations: Abdomen is soft.     Tenderness: There is no  abdominal tenderness.  Musculoskeletal:        General: No tenderness. Normal range of motion.     Cervical back: Normal range of motion and neck supple.  Skin:    General: Skin is warm and dry.  Neurological:     Mental Status:  She is alert and oriented to person, place, and time.     Cranial Nerves: No cranial nerve deficit.     Deep Tendon Reflexes: Reflexes are normal and symmetric.  Psychiatric:        Behavior: Behavior normal.        Thought Content: Thought content normal.        Judgment: Judgment normal.      BP 131/84    Pulse 69    Temp (!) 97 F (36.1 C) (Temporal)    Ht $R'5\' 5"'vF$  (1.651 m)    Wt 154 lb 3.2 oz (69.9 kg)    BMI 25.66 kg/m      Assessment & Plan:  Sarah Fox comes in today with chief complaint of Medical Management of Chronic Issues   Diagnosis and orders addressed:  1. Coronary artery disease due to lipid rich plaque - CMP14+EGFR - CBC with Differential/Platelet  2. Chronic combined systolic and diastolic heart failure (HCC) - CMP14+EGFR - CBC with Differential/Platelet  3. CAD in native artery - CMP14+EGFR - CBC with Differential/Platelet  4. Gastroesophageal reflux disease, unspecified whether esophagitis present - CMP14+EGFR - CBC with Differential/Platelet  5. Hypothyroidism, unspecified type - CMP14+EGFR - CBC with Differential/Platelet - TSH  6. Primary osteoarthritis of left hip - CMP14+EGFR - CBC with Differential/Platelet  7. Hyperlipidemia LDL goal <70 - CMP14+EGFR - CBC with Differential/Platelet - Lipid panel  8. GAD (generalized anxiety disorder) - CMP14+EGFR - CBC with Differential/Platelet  9. Former heavy tobacco smoker - CMP14+EGFR - CBC with Differential/Platelet  10. Depression, major, single episode, moderate ([place]) - CMP14+EGFR - CBC with Differential/Platelet   Labs pending Health Maintenance reviewed Diet and exercise encouraged  Follow up plan: 6 months    Evelina Dun, FNP

## 2022-01-20 LAB — CMP14+EGFR
ALT: 28 IU/L (ref 0–32)
AST: 22 IU/L (ref 0–40)
Albumin/Globulin Ratio: 2 (ref 1.2–2.2)
Albumin: 4.5 g/dL (ref 3.8–4.9)
Alkaline Phosphatase: 75 IU/L (ref 44–121)
BUN/Creatinine Ratio: 13 (ref 9–23)
BUN: 13 mg/dL (ref 6–24)
Bilirubin Total: 0.3 mg/dL (ref 0.0–1.2)
CO2: 22 mmol/L (ref 20–29)
Calcium: 9.3 mg/dL (ref 8.7–10.2)
Chloride: 103 mmol/L (ref 96–106)
Creatinine, Ser: 1.01 mg/dL — ABNORMAL HIGH (ref 0.57–1.00)
Globulin, Total: 2.2 g/dL (ref 1.5–4.5)
Glucose: 138 mg/dL — ABNORMAL HIGH (ref 70–99)
Potassium: 4.1 mmol/L (ref 3.5–5.2)
Sodium: 139 mmol/L (ref 134–144)
Total Protein: 6.7 g/dL (ref 6.0–8.5)
eGFR: 67 mL/min/{1.73_m2} (ref >59)

## 2022-01-20 LAB — CBC WITH DIFFERENTIAL/PLATELET
Basophils Absolute: 0 10*3/uL (ref 0.0–0.2)
Basos: 1 %
EOS (ABSOLUTE): 0.2 10*3/uL (ref 0.0–0.4)
Eos: 3 %
Hematocrit: 40.1 % (ref 34.0–46.6)
Hemoglobin: 13.3 g/dL (ref 11.1–15.9)
Immature Grans (Abs): 0 10*3/uL (ref 0.0–0.1)
Immature Granulocytes: 0 %
Lymphocytes Absolute: 2.3 10*3/uL (ref 0.7–3.1)
Lymphs: 37 %
MCH: 30 pg (ref 26.6–33.0)
MCHC: 33.2 g/dL (ref 31.5–35.7)
MCV: 91 fL (ref 79–97)
Monocytes Absolute: 0.4 10*3/uL (ref 0.1–0.9)
Monocytes: 6 %
Neutrophils Absolute: 3.3 10*3/uL (ref 1.4–7.0)
Neutrophils: 53 %
Platelets: 298 10*3/uL (ref 150–450)
RBC: 4.43 x10E6/uL (ref 3.77–5.28)
RDW: 12.5 % (ref 11.7–15.4)
WBC: 6.2 10*3/uL (ref 3.4–10.8)

## 2022-01-20 LAB — LIPID PANEL
Chol/HDL Ratio: 2.5 ratio (ref 0.0–4.4)
Cholesterol, Total: 121 mg/dL (ref 100–199)
HDL: 48 mg/dL (ref >39)
LDL Chol Calc (NIH): 56 mg/dL (ref 0–99)
Triglycerides: 90 mg/dL (ref 0–149)
VLDL Cholesterol Cal: 17 mg/dL (ref 5–40)

## 2022-01-20 LAB — TSH: TSH: 1.8 u[IU]/mL (ref 0.450–4.500)

## 2022-02-06 ENCOUNTER — Other Ambulatory Visit: Payer: Self-pay | Admitting: Family

## 2022-02-06 DIAGNOSIS — F411 Generalized anxiety disorder: Secondary | ICD-10-CM

## 2022-02-06 DIAGNOSIS — F321 Major depressive disorder, single episode, moderate: Secondary | ICD-10-CM

## 2022-02-12 ENCOUNTER — Other Ambulatory Visit: Payer: Self-pay | Admitting: Interventional Cardiology

## 2022-02-21 NOTE — Progress Notes (Signed)
Cardiology Office Note:    Date:  02/22/2022   ID:  [name], Sarah Fox 06/24/1966, MRN [id number]  PCP:  Junie Spencer, FNP  Cardiologist:  Lesleigh Noe, MD   Referring MD: Junie Spencer, FNP   Chief Complaint  Patient presents with   Coronary Artery Disease   Hyperlipidemia    History of Present Illness:    Sarah Fox is a 56 y.o. female with a hx of  CAD, anterior MI accompanied by V fib and successful resuscitation and acute systolic HF-with recovery to HFpEF. RFM with lipid and tobacco management.  Echo in May led to DC  coumadin.  [name] has no cardiovascular complaints.  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  She has not needed sublingual nitroglycerin.  She denies orthopnea, PND, exertional angina, palpitations, and syncope.  No medication side effects.  Past Medical History:  Diagnosis Date   Acute ST elevation myocardial infarction (STEMI) of anterior wall (HCC)    Allergy    CAD in native artery    Cardiac arrest with ventricular fibrillation (HCC) 10/2017   with acute MI and hypokalemia   Coronary artery disease    GERD (gastroesophageal reflux disease)    Ischemic cardiomyopathy 10/2017   Myocardial infarction acute (HCC) 10/2017   Thyroid disease    hypo   VF (ventricular fibrillation) (HCC)     Past Surgical History:  Procedure Laterality Date   CORONARY STENT INTERVENTION N/A 11/29/2017   Procedure: CORONARY STENT INTERVENTION;  Surgeon: Lyn Records, MD;  Location: [facility];  Service: Cardiovascular;  Laterality: N/A;   CORONARY/GRAFT ACUTE MI REVASCULARIZATION N/A 11/29/2017   Procedure: Coronary/Graft Acute MI Revascularization;  Surgeon: Lyn Records, MD;  Location: [facility];  Service: Cardiovascular;  Laterality: N/A;   LEFT HEART CATH AND CORONARY ANGIOGRAPHY N/A 11/29/2017   Procedure: LEFT HEART CATH AND CORONARY ANGIOGRAPHY;  Surgeon: Lyn Records, MD;  Location: [facility];  Service:  Cardiovascular;  Laterality: N/A;   LUMBAR DISC SURGERY     L5-S1    Current Medications: Current Meds  Medication Sig   atorvastatin (LIPITOR) 40 MG tablet TAKE 1 TABLET BY MOUTH EVERY DAY   clopidogrel (PLAVIX) 75 MG tablet Take 1 tablet (75 mg total) by mouth daily.   escitalopram (LEXAPRO) 20 MG tablet TAKE 1 TABLET BY MOUTH EVERY DAY   fluticasone (FLONASE) 50 MCG/ACT nasal spray SPRAY 2 SPRAYS INTO EACH NOSTRIL EVERY DAY   levothyroxine (SYNTHROID) 88 MCG tablet TAKE 1 TABLET BY MOUTH EVERY DAY   metoprolol succinate (TOPROL-XL) 25 MG 24 hr tablet TAKE 1 TABLET BY MOUTH EVERY DAY   nitroGLYCERIN (NITROSTAT) 0.4 MG SL tablet Place 1 tablet (0.4 mg total) under the tongue every 5 (five) minutes as needed for chest pain.   pantoprazole (PROTONIX) 40 MG tablet TAKE 1 TABLET BY MOUTH TWICE A DAY   [DISCONTINUED] aspirin EC 81 MG tablet Take 81 mg by mouth daily. Swallow whole.     Allergies:   Sulfa antibiotics   Social History   Socioeconomic History   Marital status: Married    Spouse name: Not on file   Number of children: 1   Years of education: Not on file   Highest education level: Not on file  Occupational History   Occupation: Mail carrier    Employer: [employer]  Tobacco Use   Smoking status: Former    Packs/day: 0.50  Years: 20.00    Pack years: 10.00    Types: Cigarettes    Quit date: 11/28/2017    Years since quitting: 4.2   Smokeless tobacco: Never  Vaping Use   Vaping Use: Never used  Substance and Sexual Activity   Alcohol use: Yes    Alcohol/week: 28.0 standard drinks    Types: 28 Cans of beer per week    Comment: 4 16 oz beers nightly last drink last night 02/13/21   Drug use: No   Sexual activity: Yes    Comment: ptp states she cannot be pregnant, but not using anything to not get prenant  Other Topics Concern   Not on file  Social History Narrative   Married with one child.   Social Determinants of Health   Financial Resource Strain:  Not on file  Food Insecurity: Not on file  Transportation Needs: Not on file  Physical Activity: Not on file  Stress: Not on file  Social Connections: Not on file     Family History: The patient's family history includes Breast cancer (age of onset: 50) in her mother and sister; Heart disease in her brother; Other in her father. There is no history of Colon cancer, Esophageal cancer, Rectal cancer, or Stomach cancer.  ROS:   Please see the history of present illness.    Still abstaining from cigarette smoking.  All other systems reviewed and are negative.  EKGs/Labs/Other Studies Reviewed:    The following studies were reviewed today:  2 D DOPPLER ECHO 05/18/2021: IMPRESSIONS   1. Basal and mid septum akinetic apical hypokinesis Apical partially  calcified band and shadowing artifact Doubt associated thrombus Can  consider f/u cardiac MRI with long TI post gadolinium imaging if concern  is high . Left ventricular ejection  fraction, by estimation, is 45 to 50%. The left ventricle has mildly  decreased function. The left ventricle demonstrates regional wall motion  abnormalities (see scoring diagram/findings for description). Left  ventricular diastolic parameters were normal.   2. Right ventricular systolic function is normal. The right ventricular  size is normal.   3. The mitral valve is normal in structure. Trivial mitral valve  regurgitation. No evidence of mitral stenosis.   4. The aortic valve is normal in structure. Aortic valve regurgitation is  not visualized. No aortic stenosis is present.   5. The inferior vena cava is normal in size with greater than 50%  respiratory variability, suggesting right atrial pressure of 3 mmHg.   EKG:  EKG sinus rhythm with normal EKG appearance.  There is QS pattern V1 and V2.  Recent Labs: 01/19/2022: ALT 28; BUN 13; Creatinine, Ser 1.01; Hemoglobin 13.3; Platelets 298; Potassium 4.1; Sodium 139; TSH 1.800  Recent Lipid Panel     Component Value Date/Time   CHOL 121 01/19/2022 1417   TRIG 90 01/19/2022 1417   HDL 48 01/19/2022 1417   CHOLHDL 2.5 01/19/2022 1417   CHOLHDL 2.7 02/15/2021 0500   VLDL 32 02/15/2021 0500   LDLCALC 56 01/19/2022 1417    Physical Exam:    VS:  BP 116/70    Pulse 60    Ht 5\' 5"  (1.651 m)    Wt 157 lb (71.2 kg)    SpO2 98%    BMI 26.13 kg/m     Wt Readings from Last 3 Encounters:  02/22/22 157 lb (71.2 kg)  01/19/22 154 lb 3.2 oz (69.9 kg)  05/04/21 160 lb (72.6 kg)     GEN: Slightly overweight.  No acute distress HEENT: Normal NECK: No JVD. LYMPHATICS: No lymphadenopathy CARDIAC: No murmur. RRR no gallop, or edema. VASCULAR:  Normal Pulses. No bruits. RESPIRATORY:  Clear to auscultation without rales, wheezing or rhonchi  ABDOMEN: Soft, non-tender, non-distended, No pulsatile mass, MUSCULOSKELETAL: No deformity  SKIN: Warm and dry NEUROLOGIC:  Alert and oriented x 3 PSYCHIATRIC:  Normal affect   ASSESSMENT:    1. Coronary artery disease involving native coronary artery of native heart without angina pectoris   2. Encounter for therapeutic drug monitoring   3. HFmrEF (heart failure with mildly reduced EF)   4. Hyperlipidemia LDL goal <70   5. LV (left ventricular) mural thrombus    PLAN:    In order of problems listed above:  Secondary prevention reviewed.  On aspirin and Plavix.  We will convert to clopidogrel monotherapy. Coumadin therapy was discontinued after echo performed back in May.  Still on aspirin and Plavix. The LVEF is lower normal.  Consider SGLT2/ARB or Entresto.  We discussed management strategy and for the time being we will continue to follow.  LVEF is at the lower limit of normal to mildly depressed. Continue high intensity statin therapy and goal LDL less than 70. Thrombus resolved.  6 months therapy with Coumadin.   Guideline directed therapy for left ventricular systolic dysfunction: Angiotensin receptor-neprilysin inhibitor (ARNI)-Entresto;  beta-blocker therapy - carvedilol, metoprolol succinate, or bisoprolol; mineralocorticoid receptor antagonist (MRA) therapy -spironolactone or eplerenone.  SGLT-2 agents -  Dapagliflozin Marcelline Deist) or Empagliflozin (Jardiance).These therapies have been shown to improve clinical outcomes including reduction of rehospitalization, survival, and acute heart failure.     Medication Adjustments/Labs and Tests Ordered: Current medicines are reviewed at length with the patient today.  Concerns regarding medicines are outlined above.  Orders Placed This Encounter  Procedures   EKG 12-Lead   No orders of the defined types were placed in this encounter.   Patient Instructions  Medication Instructions:  1) DISCONTINUE Aspirin  *If you need a refill on your cardiac medications before your next appointment, please call your pharmacy*   Lab Work: None If you have labs (blood work) drawn today and your tests are completely normal, you will receive your results only by: MyChart Message (if you have MyChart) OR A paper copy in the mail If you have any lab test that is abnormal or we need to change your treatment, we will call you to review the results.   Testing/Procedures: None   Follow-Up: At [facility], you and your health needs are our priority.  As part of our continuing mission to provide you with exceptional heart care, we have created designated Provider Care Teams.  These Care Teams include your primary Cardiologist (physician) and Advanced Practice Providers (APPs -  Physician Assistants and Nurse Practitioners) who all work together to provide you with the care you need, when you need it.  We recommend signing up for the patient portal called "MyChart".  Sign up information is provided on this After Visit Summary.  MyChart is used to connect with patients for Virtual Visits (Telemedicine).  Patients are able to view lab/test results, encounter notes, upcoming appointments, etc.   Non-urgent messages can be sent to your provider as well.   To learn more about what you can do with MyChart, go to [email or web address].    Your next appointment:   1 year(s)  The format for your next appointment:   In Person  Provider:   Lesleigh Noe, MD  Other Instructions     Signed, Lesleigh Noe, MD  02/22/2022 5:12 PM    [facility]

## 2022-02-22 ENCOUNTER — Ambulatory Visit: Payer: Federal, State, Local not specified - PPO | Admitting: Interventional Cardiology

## 2022-02-22 ENCOUNTER — Other Ambulatory Visit: Payer: Self-pay

## 2022-02-22 ENCOUNTER — Encounter: Payer: Self-pay | Admitting: Interventional Cardiology

## 2022-02-22 VITALS — BP 116/70 | HR 60 | Ht 65.0 in | Wt 157.0 lb

## 2022-02-22 DIAGNOSIS — E785 Hyperlipidemia, unspecified: Secondary | ICD-10-CM | POA: Diagnosis not present

## 2022-02-22 DIAGNOSIS — I5020 Unspecified systolic (congestive) heart failure: Secondary | ICD-10-CM | POA: Diagnosis not present

## 2022-02-22 DIAGNOSIS — Z5181 Encounter for therapeutic drug level monitoring: Secondary | ICD-10-CM

## 2022-02-22 DIAGNOSIS — I2510 Atherosclerotic heart disease of native coronary artery without angina pectoris: Secondary | ICD-10-CM

## 2022-02-22 DIAGNOSIS — I513 Intracardiac thrombosis, not elsewhere classified: Secondary | ICD-10-CM

## 2022-02-22 NOTE — Patient Instructions (Signed)
Medication Instructions:  ?1) DISCONTINUE Aspirin ? ?*If you need a refill on your cardiac medications before your next appointment, please call your pharmacy* ? ? ?Lab Work: ?None ?If you have labs (blood work) drawn today and your tests are completely normal, you will receive your results only by: ?MyChart Message (if you have MyChart) OR ?A paper copy in the mail ?If you have any lab test that is abnormal or we need to change your treatment, we will call you to review the results. ? ? ?Testing/Procedures: ?None ? ? ?Follow-Up: ?At [facility], you and your health needs are our priority.  As part of our continuing mission to provide you with exceptional heart care, we have created designated Provider Care Teams.  These Care Teams include your primary Cardiologist (physician) and Advanced Practice Providers (APPs -  Physician Assistants and Nurse Practitioners) who all work together to provide you with the care you need, when you need it. ? ?We recommend signing up for the patient portal called "MyChart".  Sign up information is provided on this After Visit Summary.  MyChart is used to connect with patients for Virtual Visits (Telemedicine).  Patients are able to view lab/test results, encounter notes, upcoming appointments, etc.  Non-urgent messages can be sent to your provider as well.   ?To learn more about what you can do with MyChart, go to [email or web address].   ? ?Your next appointment:   ?1 year(s) ? ?The format for your next appointment:   ?In Person ? ?Provider:   ?Henry W Smith III, MD   ? ? ?Other Instructions ?  ?

## 2022-03-04 ENCOUNTER — Other Ambulatory Visit: Payer: Self-pay | Admitting: Interventional Cardiology

## 2022-03-04 ENCOUNTER — Other Ambulatory Visit: Payer: Self-pay | Admitting: Family

## 2022-03-04 DIAGNOSIS — H9312 Tinnitus, left ear: Secondary | ICD-10-CM

## 2022-05-21 ENCOUNTER — Other Ambulatory Visit: Payer: Self-pay | Admitting: Interventional Cardiology

## 2022-05-23 IMAGING — CT CT ANGIO HEAD
3 of 11 series · 16 of 47 positions shown · IV contrast (omnipaque)
Comparison: MRI 01/02/2021

CLINICAL DATA: Pulsatile tinnitus on the left

EXAM:
CT ANGIOGRAPHY HEAD
TECHNIQUE: Multidetector CT imaging of the head was performed using the
standard protocol during bolus administration of intravenous
contrast. Multiplanar CT image reconstructions and MIPs were
obtained to evaluate the vascular anatomy.
CONTRAST:  75mL OMNIPAQUE IOHEXOL 350 MG/ML SOLN

[Series 8: ax thin · axial · 0.39mm/px · z∈[+485,+619]mm · 10 of 158 slices shown]
[im 12/158  brain]
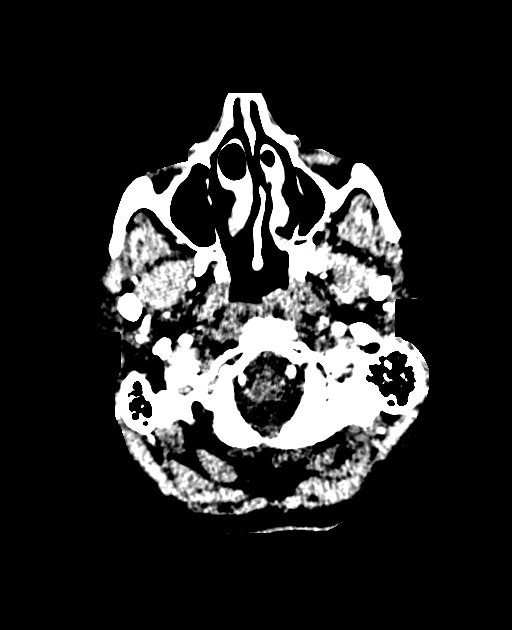
[im 23/158  bone]
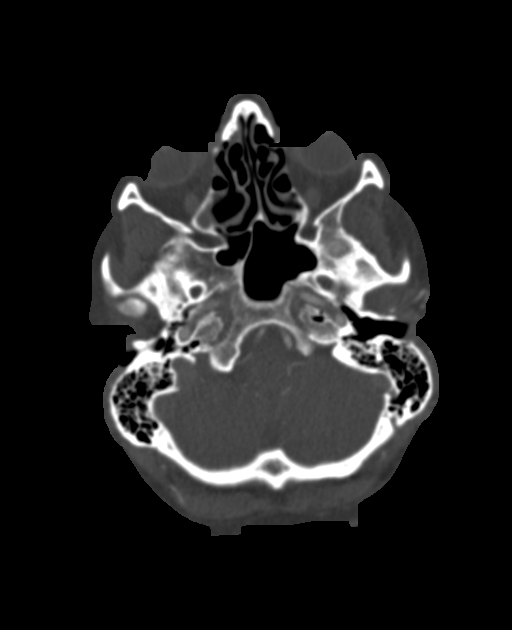
[im 45/158  brain]
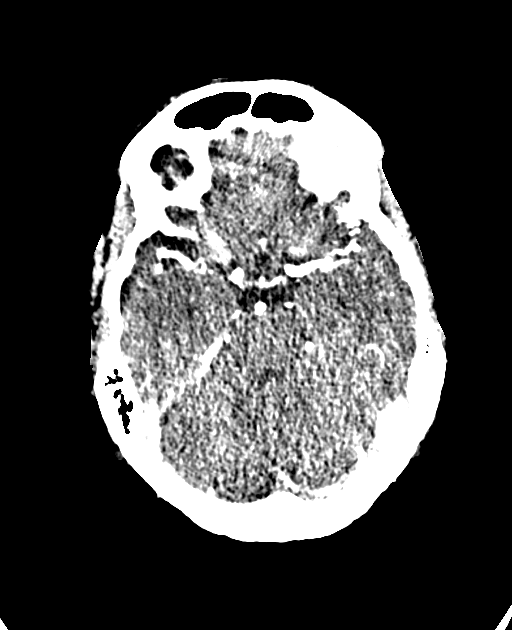
[im 57/158  bone]
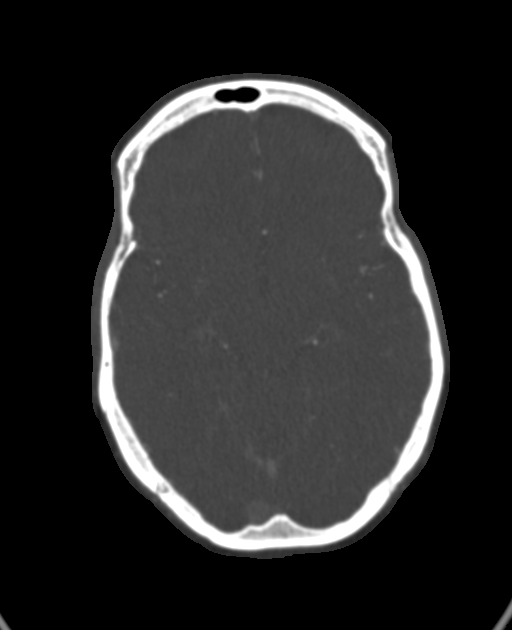
[im 68/158  brain]
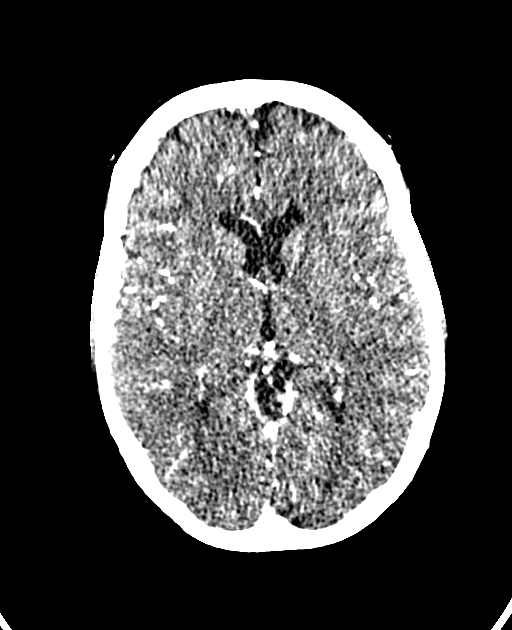
[im 90/158  bone]
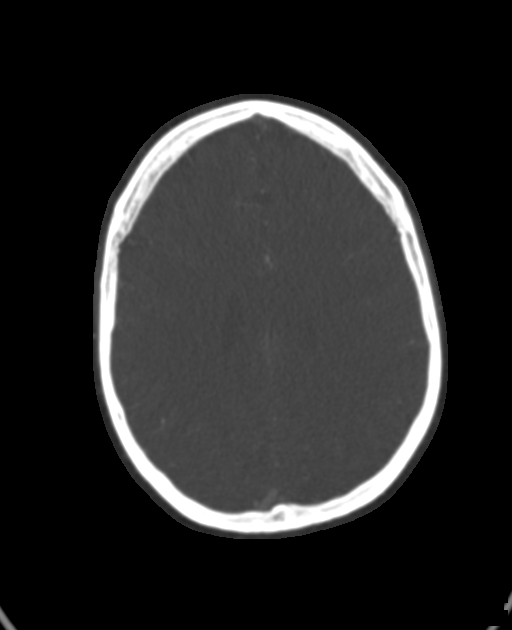
[im 101/158  brain]
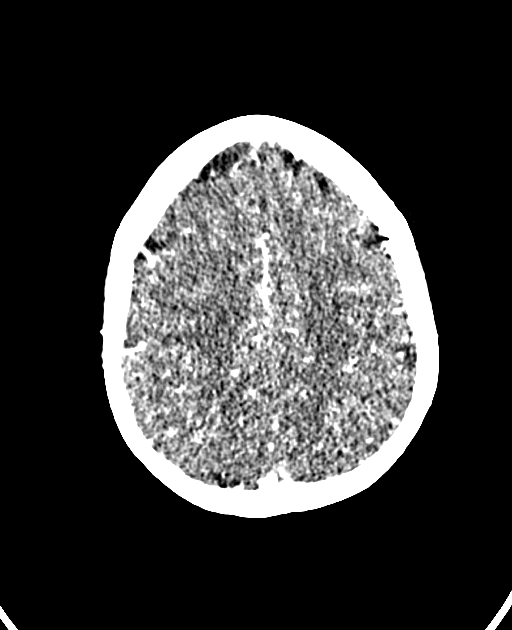
[im 113/158  bone]
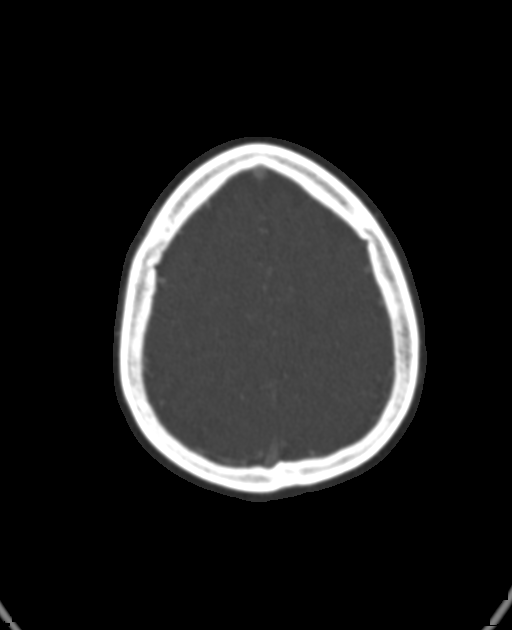
[im 135/158  brain]
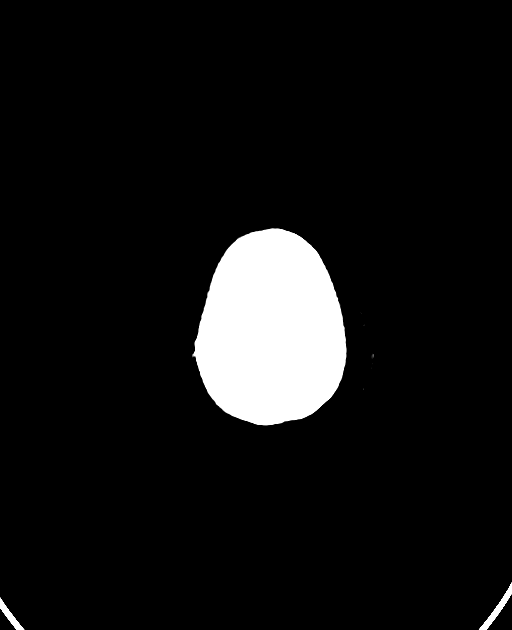
[im 146/158  bone]
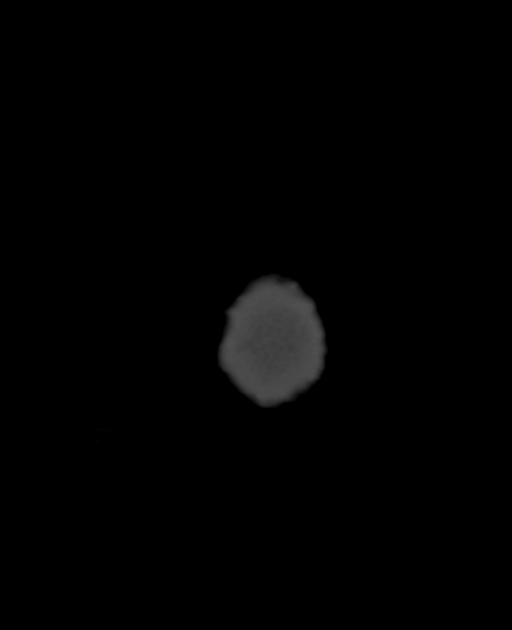

[Series 10: cor thin · coronal · 0.38mm/px · 3 of 222 slices shown]
[im 74/222  brain]
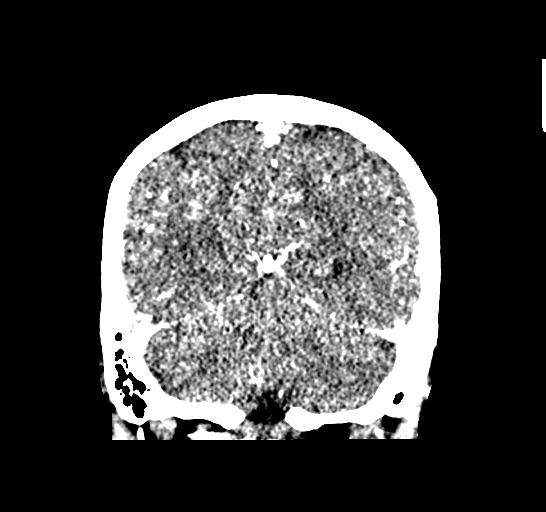
[im 111/222  brain]
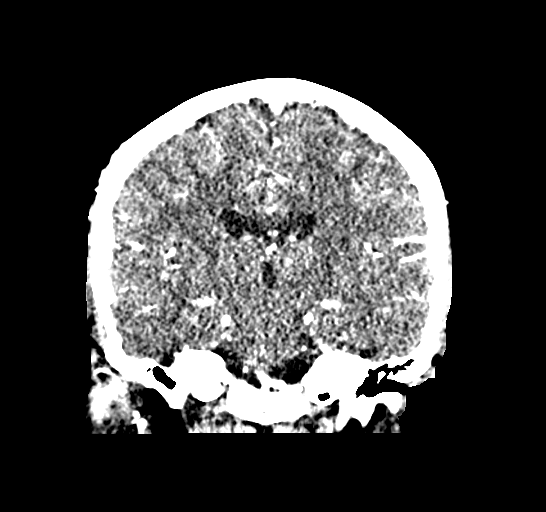
[im 148/222  brain]
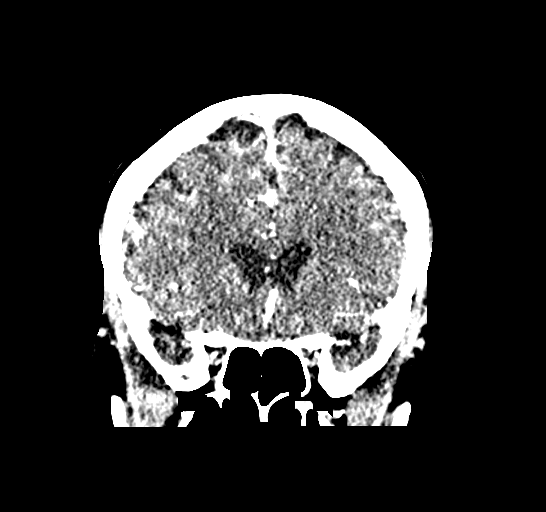

[Series 12: sag thin · sagittal · 0.36mm/px · 3 of 167 slices shown]
[im 42/167  brain]
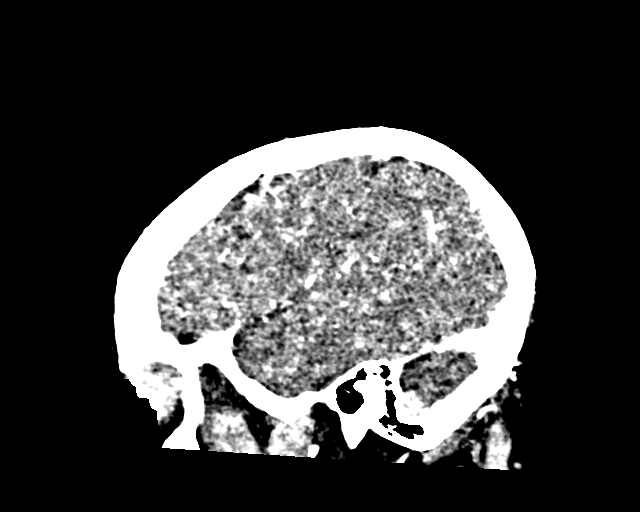
[im 84/167  brain]
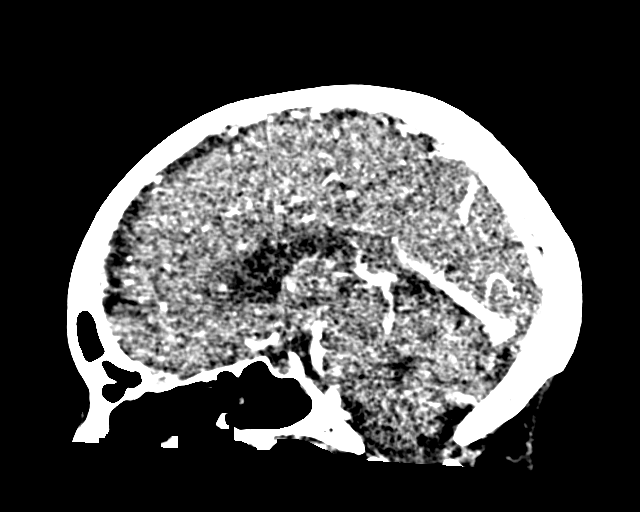
[im 125/167  brain]
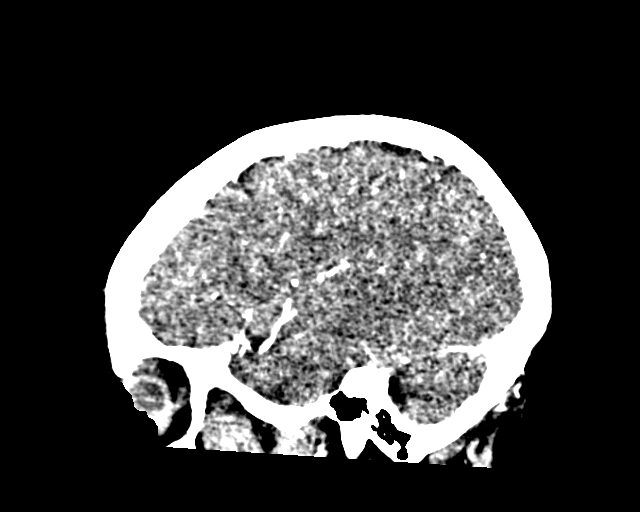

[16 of 47 positions shown; findings below may reference images not displayed]

FINDINGS: Head CT

Brain: The brain shows a normal appearance without evidence of
malformation, atrophy, old or acute small or large vessel
infarction, mass lesion, hemorrhage, hydrocephalus or extra-axial
collection.

Vascular: No hyperdense vessel. No evidence of atherosclerotic
calcification.

Skull: Normal.  No traumatic finding.  No focal bone lesion.

Sinuses/Orbits: Sinuses are clear. Orbits appear normal. Mastoids
are clear.

Other: None significant

CTA HEAD

Anterior circulation: Both internal carotid arteries are patent
through the skull base and siphon regions. No siphon stenosis or
atherosclerotic calcification. The anterior and middle cerebral
vessels are normal without proximal stenosis, aneurysm or vascular
malformation.

Posterior circulation: Both vertebral arteries are patent through
the foramen magnum to the basilar. No basilar stenosis. Posterior
circulation branch vessels are normal. Similarly, no sign of
aneurysm or vascular malformation the posterior circulation.

Venous sinuses: Normal appearance.  No evidence of dural AV fistula.

Anatomic variants: None
IMPRESSION: Normal intracranial CT angiography. No abnormality seen to explain
pulsatile tinnitus.

## 2022-06-14 ENCOUNTER — Ambulatory Visit (INDEPENDENT_AMBULATORY_CARE_PROVIDER_SITE_OTHER): Payer: Federal, State, Local not specified - PPO | Admitting: Family Medicine

## 2022-06-14 ENCOUNTER — Telehealth: Payer: Self-pay | Admitting: Family

## 2022-06-14 DIAGNOSIS — F430 Acute stress reaction: Secondary | ICD-10-CM

## 2022-06-14 MED ORDER — HYDROXYZINE HCL 25 MG PO TABS: 12.5000 mg | ORAL_TABLET | Freq: Three times a day (TID) | ORAL | 0 refills | Status: DC | PRN

## 2022-06-14 NOTE — Progress Notes (Signed)
Telephone visit  Subjective: CC: acute stress reaction PCP: Junie Spencer, FNP [id number] Sarah Fox is a 56 y.o. female calls for telephone consult today. Patient provides verbal consent for consult held via phone.  Due to COVID-19 pandemic this visit was conducted virtually. This visit type was conducted due to national recommendations for restrictions regarding the COVID-19 Pandemic (e.g. social distancing, sheltering in place) in an effort to limit this patient's exposure and mitigate transmission in our community. All issues noted in this document were discussed and addressed.  A physical exam was not performed with this format.   Location of patient: car Location of provider: [facility] Others present for call: none  1. Acute stress reaction She found out her brother has cancer today.  She has been overwhelmed and anxious about this news. She is treated with Lexapro.  She feels calmer than when she called to make the appointment but does feel some stress regarding the recent diagnosis for her brother and her mother's past history of cancer.   ROS: Per HPI  Allergies  Allergen Reactions   Sulfa Antibiotics Rash   Past Medical History:  Diagnosis Date   Acute ST elevation myocardial infarction (STEMI) of anterior wall (HCC)    Allergy    CAD in native artery    Cardiac arrest with ventricular fibrillation (HCC) 10/2017   with acute MI and hypokalemia   Coronary artery disease    GERD (gastroesophageal reflux disease)    Ischemic cardiomyopathy 10/2017   Myocardial infarction acute (HCC) 10/2017   Thyroid disease    hypo   VF (ventricular fibrillation) (HCC)     Current Outpatient Medications:    atorvastatin (LIPITOR) 40 MG tablet, TAKE 1 TABLET BY MOUTH EVERY DAY, Disp: 90 tablet, Rfl: 3   clopidogrel (PLAVIX) 75 MG tablet, Take 1 tablet (75 mg total) by mouth daily., Disp: 90 tablet, Rfl: 3   escitalopram (LEXAPRO) 20 MG tablet, TAKE 1 TABLET BY MOUTH EVERY DAY, Disp: 90  tablet, Rfl: 1   fluticasone (FLONASE) 50 MCG/ACT nasal spray, SPRAY 2 SPRAYS INTO EACH NOSTRIL EVERY DAY, Disp: 48 mL, Rfl: 1   levothyroxine (SYNTHROID) 88 MCG tablet, TAKE 1 TABLET BY MOUTH EVERY DAY, Disp: 90 tablet, Rfl: 3   metoprolol succinate (TOPROL-XL) 25 MG 24 hr tablet, TAKE 1 TABLET BY MOUTH EVERY DAY, Disp: 90 tablet, Rfl: 3   nitroGLYCERIN (NITROSTAT) 0.4 MG SL tablet, Place 1 tablet (0.4 mg total) under the tongue every 5 (five) minutes as needed for chest pain., Disp: 25 tablet, Rfl: 3   pantoprazole (PROTONIX) 40 MG tablet, TAKE 1 TABLET BY MOUTH TWICE A DAY, Disp: 180 tablet, Rfl: 2  Assessment/ Plan: 56 y.o. female   Acute stress reaction - Plan: hydrOXYzine (ATARAX) 25 MG tablet  Currently having an acute stress reaction.  Offered counseling services but it sounds like she is trying to work through this given that it is very fresh.  She was agreeable to Atarax as needed.  Continue Lexapro.  Work note provided excusing from the remainder of the week.  Follow-up with PCP as needed  Start time: 1:21pm (LVM), 1:31pm (LVM); 1:48pm (no answer); 4:32pm End time: 4:37pm  Total time spent on patient care (including telephone call/ virtual visit): 5 minutes  Keita Demarco Hulen Skains, DO [facility] [phone]

## 2022-06-14 NOTE — Patient Instructions (Signed)
Managing Stress, Adult Feeling a certain amount of stress is normal. Stress helps our body and mind get ready to deal with the demands of life. Stress hormones can motivate you to do well at work and meet your responsibilities. But severe or long-term (chronic) stress can affect your mental and physical health. Chronic stress puts you at higher risk for: Anxiety and depression. Other health problems such as digestive problems, muscle aches, heart disease, high blood pressure, and stroke. What are the causes? Common causes of stress include: Demands from work, such as deadlines, feeling overworked, or having long hours. Pressures at home, such as money issues, disagreements with a spouse, or parenting issues. Pressures from major life changes, such as divorce, moving, loss of a loved one, or chronic illness. You may be at higher risk for stress-related problems if you: Do not get enough sleep. Are in poor health. Do not have emotional support. Have a mental health disorder such as anxiety or depression. How to recognize stress Stress can make you: Have trouble sleeping. Feel sad, anxious, irritable, or overwhelmed. Lose your appetite. Overeat or want to eat unhealthy foods. Want to use drugs or alcohol. Stress can also cause physical symptoms, such as: Sore, tense muscles, especially in the shoulders and neck. Headaches. Trouble breathing. A faster heart rate. Stomach pain, nausea, or vomiting. Diarrhea or constipation. Trouble concentrating. Follow these instructions at home: Eating and drinking Eat a healthy diet. This includes: Eating foods that are high in fiber, such as beans, whole grains, and fresh fruits and vegetables. Limiting foods that are high in fat and processed sugars, such as fried or sweet foods. Do not skip meals or overeat. Drink enough fluid to keep your urine pale yellow. Alcohol use Do not drink alcohol if: Your health care provider tells you not to  drink. You are pregnant, may be pregnant, or are planning to become pregnant. Drinking alcohol is a way some people try to ease their stress. This can be dangerous, so if you drink alcohol: Limit how much you have to: 0-1 drink a day for women. 0-2 drinks a day for men. Know how much alcohol is in your drink. In the [place] one drink equals one 12 oz bottle of beer (355 mL), one 5 oz glass of wine (148 mL), or one 1 oz glass of hard liquor (44 mL). Activity  Include 30 minutes of exercise in your daily schedule. Exercise is a good stress reducer. Include time in your day for an activity that you find relaxing. Try taking a walk, going on a bike ride, reading a book, or listening to music. Schedule your time in a way that lowers stress, and keep a regular schedule. Focus on doing what is most important to get done. Lifestyle Identify the source of your stress and your reaction to it. See a therapist who can help you change unhelpful reactions. When there are stressful events: Talk about them with family, friends, or coworkers. Try to think realistically about stressful events and not ignore them or overreact. Try to find the positives in a stressful situation and not focus on the negatives. Cut back on responsibilities at work and home, if possible. Ask for help from friends or family members if you need it. Find ways to manage stress, such as: Mindfulness, meditation, or deep breathing. Yoga or tai chi. Progressive muscle relaxation. Spending time in nature. Doing art, playing music, or reading. Making time for fun activities. Spending time with family and friends. Get support  from family, friends, or spiritual resources. General instructions Get enough sleep. Try to go to sleep and get up at about the same time every day. Take over-the-counter and prescription medicines only as told by your health care provider. Do not use any products that contain nicotine or tobacco. These products  include cigarettes, chewing tobacco, and vaping devices, such as e-cigarettes. If you need help quitting, ask your health care provider. Do not use drugs or smoke to deal with stress. Keep all follow-up visits. This is important. Where to find support Talk with your health care provider about stress management or finding a support group. Find a therapist to work with you on your stress management techniques. Where to find more information [employer] on Mental Illness: www.nami.org American Psychological Association: TVStereos.ch Contact a health care provider if: Your stress symptoms get worse. You are unable to manage your stress at home. You are struggling to stop using drugs or alcohol. Get help right away if: You may be a danger to yourself or others. You have any thoughts of death or suicide. Get help right awayif you feel like you may hurt yourself or others, or have thoughts about taking your own life. Go to your nearest emergency room or: Call 911. Call the [facility] at 289-837-1647 or 988 in the [place] This is open 24 hours a day. Text the Crisis Text Line at (726)508-3618. Summary Feeling a certain amount of stress is normal, but severe or long-term (chronic) stress can affect your mental and physical health. Chronic stress can put you at higher risk for anxiety, depression, and other health problems such as digestive problems, muscle aches, heart disease, high blood pressure, and stroke. You may be at higher risk for stress-related problems if you do not get enough sleep, are in poor health, lack emotional support, or have a mental health disorder such as anxiety or depression. Identify the source of your stress and your reaction to it. Try talking about stressful events with family, friends, or coworkers, finding a coping method, or getting support from spiritual resources. If you need more help, talk with your health care provider about finding a  support group or a mental health therapist. This information is not intended to replace advice given to you by your health care provider. Make sure you discuss any questions you have with your health care provider. Document Revised: 07/09/2021 Document Reviewed: 07/07/2021 Elsevier Patient Education  [place].

## 2022-07-19 ENCOUNTER — Ambulatory Visit (INDEPENDENT_AMBULATORY_CARE_PROVIDER_SITE_OTHER): Payer: Federal, State, Local not specified - PPO | Admitting: Family

## 2022-07-19 ENCOUNTER — Encounter: Payer: Self-pay | Admitting: Family

## 2022-07-19 VITALS — BP 113/63 | HR 72 | Temp 97.9°F | Ht 65.0 in | Wt 157.8 lb

## 2022-07-19 DIAGNOSIS — K219 Gastro-esophageal reflux disease without esophagitis: Secondary | ICD-10-CM | POA: Diagnosis not present

## 2022-07-19 DIAGNOSIS — F321 Major depressive disorder, single episode, moderate: Secondary | ICD-10-CM

## 2022-07-19 DIAGNOSIS — E039 Hypothyroidism, unspecified: Secondary | ICD-10-CM | POA: Diagnosis not present

## 2022-07-19 DIAGNOSIS — I2510 Atherosclerotic heart disease of native coronary artery without angina pectoris: Secondary | ICD-10-CM

## 2022-07-19 DIAGNOSIS — M1612 Unilateral primary osteoarthritis, left hip: Secondary | ICD-10-CM

## 2022-07-19 DIAGNOSIS — I2583 Coronary atherosclerosis due to lipid rich plaque: Secondary | ICD-10-CM | POA: Diagnosis not present

## 2022-07-19 DIAGNOSIS — F411 Generalized anxiety disorder: Secondary | ICD-10-CM

## 2022-07-19 DIAGNOSIS — E785 Hyperlipidemia, unspecified: Secondary | ICD-10-CM

## 2022-07-19 MED ORDER — ESCITALOPRAM OXALATE 20 MG PO TABS: 20.0000 mg | ORAL_TABLET | Freq: Every day | ORAL | 4 refills | Status: DC

## 2022-07-19 NOTE — Progress Notes (Signed)
Subjective:    Patient ID: Sarah Fox, female    DOB: [birth year], 56 y.o.   MRN: [id number]  Chief Complaint  Patient presents with   Medical Management of Chronic Issues   PT calls  the office today for chronic follow up. She is followed by Cardiologists annually for CAD and STEMI in 2018.  Gastroesophageal Reflux She complains of belching and heartburn. She reports no chest pain. This is a chronic problem. The current episode started more than 1 year ago. The problem occurs occasionally. She has tried a PPI for the symptoms. The treatment provided moderate relief.  Congestive Heart Failure Presents for follow-up visit. Pertinent negatives include no chest pain, chest pressure, edema or shortness of breath. The symptoms have been stable.  Thyroid Problem Presents for follow-up visit. Symptoms include anxiety. Patient reports no depressed mood, diarrhea or dry skin. The symptoms have been stable.  Arthritis Presents for follow-up visit. She reports no stiffness. The symptoms have been stable. Her pain is at a severity of 0/10. Pertinent negatives include no diarrhea.  Anxiety Presents for follow-up visit. Symptoms include excessive worry and nervous/anxious behavior. Patient reports no chest pain, depressed mood, panic or shortness of breath. Symptoms occur rarely. The severity of symptoms is mild.    Depression        This is a chronic problem.  The current episode started more than 1 year ago.   The onset quality is gradual.   The problem occurs intermittently.  Associated symptoms include no helplessness and no hopelessness.  Past treatments include SSRIs - Selective serotonin reuptake inhibitors.  Past medical history includes thyroid problem and anxiety.       Review of Systems  Respiratory:  Negative for shortness of breath.   Cardiovascular:  Negative for chest pain.  Gastrointestinal:  Positive for heartburn. Negative for diarrhea.  Musculoskeletal:  Positive for  arthritis. Negative for stiffness.  Psychiatric/Behavioral:  Positive for depression. The patient is nervous/anxious.   All other systems reviewed and are negative.      Objective:   Physical Exam Vitals reviewed.  Constitutional:      General: She is not in acute distress.    Appearance: She is well-developed.  HENT:     Head: Normocephalic and atraumatic.     Right Ear: Tympanic membrane normal.     Left Ear: Tympanic membrane normal.  Eyes:     Pupils: Pupils are equal, round, and reactive to light.  Neck:     Thyroid: No thyromegaly.  Cardiovascular:     Rate and Rhythm: Normal rate and regular rhythm.     Heart sounds: Normal heart sounds. No murmur heard. Pulmonary:     Effort: Pulmonary effort is normal. No respiratory distress.     Breath sounds: Normal breath sounds. No wheezing.  Abdominal:     General: Bowel sounds are normal. There is no distension.     Palpations: Abdomen is soft.     Tenderness: There is no abdominal tenderness.  Musculoskeletal:        General: No tenderness. Normal range of motion.     Cervical back: Normal range of motion and neck supple.  Skin:    General: Skin is warm and dry.  Neurological:     Mental Status: She is alert and oriented to person, place, and time.     Cranial Nerves: No cranial nerve deficit.     Deep Tendon Reflexes: Reflexes are normal and symmetric.  Psychiatric:  Behavior: Behavior normal.        Thought Content: Thought content normal.        Judgment: Judgment normal.       BP 113/63   Pulse 72   Temp 97.9 F (36.6 C)   Ht $R'5\' 5"'Vg$  (1.651 m)   Wt 157 lb 12.8 oz (71.6 kg)   SpO2 98%   BMI 26.26 kg/m      Assessment & Plan:  Sarah Fox comes in today with chief complaint of Medical Management of Chronic Issues   Diagnosis and orders addressed:  1. CAD in native artery - CMP14+EGFR - CBC with Differential/Platelet  2. Coronary artery disease due to lipid rich plaque - CMP14+EGFR - CBC  with Differential/Platelet  3. Gastroesophageal reflux disease, unspecified whether esophagitis present - CMP14+EGFR - CBC with Differential/Platelet  4. Hypothyroidism, unspecified type - CMP14+EGFR - CBC with Differential/Platelet  5. Primary osteoarthritis of left hip - CMP14+EGFR - CBC with Differential/Platelet  6. Depression, major, single episode, moderate (HCC) - CMP14+EGFR - CBC with Differential/Platelet - escitalopram (LEXAPRO) 20 MG tablet; Take 1 tablet (20 mg total) by mouth daily.  Dispense: 90 tablet; Refill: 4  7. GAD (generalized anxiety disorder) - CMP14+EGFR - CBC with Differential/Platelet - escitalopram (LEXAPRO) 20 MG tablet; Take 1 tablet (20 mg total) by mouth daily.  Dispense: 90 tablet; Refill: 4  8. Hyperlipidemia LDL goal <70  - CMP14+EGFR - CBC with Differential/Platelet   Labs pending Health Maintenance reviewed Diet and exercise encouraged  Follow up plan: 6 months   Evelina Dun, FNP

## 2022-07-19 NOTE — Patient Instructions (Signed)

## 2022-07-20 LAB — CBC WITH DIFFERENTIAL/PLATELET
Basophils Absolute: 0 10*3/uL (ref 0.0–0.2)
Basos: 0 %
EOS (ABSOLUTE): 0.7 10*3/uL — ABNORMAL HIGH (ref 0.0–0.4)
Eos: 9 %
Hematocrit: 37.1 % (ref 34.0–46.6)
Hemoglobin: 12.7 g/dL (ref 11.1–15.9)
Immature Grans (Abs): 0 10*3/uL (ref 0.0–0.1)
Immature Granulocytes: 0 %
Lymphocytes Absolute: 2.6 10*3/uL (ref 0.7–3.1)
Lymphs: 33 %
MCH: 30.6 pg (ref 26.6–33.0)
MCHC: 34.2 g/dL (ref 31.5–35.7)
MCV: 89 fL (ref 79–97)
Monocytes Absolute: 0.5 10*3/uL (ref 0.1–0.9)
Monocytes: 6 %
Neutrophils Absolute: 4 10*3/uL (ref 1.4–7.0)
Neutrophils: 52 %
Platelets: 283 10*3/uL (ref 150–450)
RBC: 4.15 x10E6/uL (ref 3.77–5.28)
RDW: 13.5 % (ref 11.7–15.4)
WBC: 7.9 10*3/uL (ref 3.4–10.8)

## 2022-07-20 LAB — CMP14+EGFR
ALT: 28 IU/L (ref 0–32)
AST: 22 IU/L (ref 0–40)
Albumin/Globulin Ratio: 2.1 (ref 1.2–2.2)
Albumin: 4.6 g/dL (ref 3.8–4.9)
Alkaline Phosphatase: 71 IU/L (ref 44–121)
BUN/Creatinine Ratio: 16 (ref 9–23)
BUN: 14 mg/dL (ref 6–24)
Bilirubin Total: 0.2 mg/dL (ref 0.0–1.2)
CO2: 25 mmol/L (ref 20–29)
Calcium: 9.6 mg/dL (ref 8.7–10.2)
Chloride: 106 mmol/L (ref 96–106)
Creatinine, Ser: 0.87 mg/dL (ref 0.57–1.00)
Globulin, Total: 2.2 g/dL (ref 1.5–4.5)
Glucose: 94 mg/dL (ref 70–99)
Potassium: 4.3 mmol/L (ref 3.5–5.2)
Sodium: 144 mmol/L (ref 134–144)
Total Protein: 6.8 g/dL (ref 6.0–8.5)
eGFR: 80 mL/min/{1.73_m2} (ref >59)

## 2022-08-20 ENCOUNTER — Other Ambulatory Visit: Payer: Self-pay | Admitting: Family

## 2022-08-20 DIAGNOSIS — H9312 Tinnitus, left ear: Secondary | ICD-10-CM

## 2022-08-25 ENCOUNTER — Other Ambulatory Visit: Payer: Self-pay | Admitting: Interventional Cardiology

## 2022-09-07 ENCOUNTER — Other Ambulatory Visit: Payer: Self-pay | Admitting: Family

## 2022-09-07 DIAGNOSIS — Z1231 Encounter for screening mammogram for malignant neoplasm of breast: Secondary | ICD-10-CM

## 2022-10-04 ENCOUNTER — Ambulatory Visit
Admission: RE | Admit: 2022-10-04 | Discharge: 2022-10-04 | Disposition: A | Discharge status: Home / Self Care | Payer: Federal, State, Local not specified - PPO | Source: Ambulatory Visit | Attending: Family | Admitting: Family

## 2022-10-04 DIAGNOSIS — Z1231 Encounter for screening mammogram for malignant neoplasm of breast: Secondary | ICD-10-CM | POA: Diagnosis not present

## 2022-11-26 ENCOUNTER — Other Ambulatory Visit: Payer: Self-pay | Admitting: Interventional Cardiology

## 2022-12-09 ENCOUNTER — Ambulatory Visit: Payer: Federal, State, Local not specified - PPO | Admitting: Family Medicine

## 2022-12-09 ENCOUNTER — Encounter: Payer: Self-pay | Admitting: Family Medicine

## 2022-12-09 VITALS — BP 129/69 | HR 87 | Temp 97.5°F | Ht 65.0 in | Wt 157.4 lb

## 2022-12-09 DIAGNOSIS — R051 Acute cough: Secondary | ICD-10-CM | POA: Diagnosis not present

## 2022-12-09 DIAGNOSIS — J069 Acute upper respiratory infection, unspecified: Secondary | ICD-10-CM

## 2022-12-09 LAB — VERITOR FLU A/B WAIVED
Influenza A: NEGATIVE
Influenza B: NEGATIVE

## 2022-12-09 MED ORDER — GUAIFENESIN ER 600 MG PO TB12: 600.0000 mg | ORAL_TABLET | Freq: Two times a day (BID) | ORAL | 0 refills | Status: AC

## 2022-12-09 NOTE — Progress Notes (Signed)
Subjective:  Patient ID: Sarah Fox, female    DOB: [birth year], 56 y.o.   MRN: [id number]  Patient Care Team: Sharion Balloon, FNP as PCP - General (Family Medicine) Belva Crome, MD as PCP - Cardiology (Cardiology)   Chief Complaint:  Fever, Chills, Generalized Body Aches, Nasal Congestion, and Headache (X 2 days)   HPI: Sarah Fox is a 56 y.o. female presenting on 12/09/2022 for Fever, Chills, Generalized Body Aches, Nasal Congestion, and Headache (X 2 days)   Fever  This is a new problem. Episode onset: 2 days ago. The problem occurs constantly. The problem has been waxing and waning. Associated symptoms include congestion, coughing, headaches and muscle aches. Pertinent negatives include no abdominal pain, chest pain, diarrhea, ear pain, nausea, rash, sleepiness, sore throat, urinary pain, vomiting or wheezing. She has tried NSAIDs for the symptoms. The treatment provided no relief.  Headache  This is a new problem. Episode onset: 2 days ago. The problem occurs intermittently. The problem has been waxing and waning. The pain does not radiate. The quality of the pain is described as aching. The pain is mild. Associated symptoms include coughing, drainage, a fever, muscle aches and rhinorrhea. Pertinent negatives include no abdominal pain, abnormal behavior, anorexia, back pain, blurred vision, dizziness, ear pain, eye pain, eye redness, eye watering, facial sweating, hearing loss, insomnia, loss of balance, nausea, neck pain, numbness, phonophobia, photophobia, scalp tenderness, seizures, sinus pressure, sore throat, swollen glands, tingling, tinnitus, visual change, vomiting, weakness or weight loss. Nothing aggravates the symptoms. She has tried NSAIDs for the symptoms. The treatment provided mild relief.     Relevant past medical, surgical, family, and social history reviewed and updated as indicated.  Allergies and medications reviewed and updated. Data reviewed: Chart  in Epic.   Past Medical History:  Diagnosis Date   Acute ST elevation myocardial infarction (STEMI) of anterior wall (HCC)    Allergy    CAD in native artery    Cardiac arrest with ventricular fibrillation ([place]) 10/2017   with acute MI and hypokalemia   Coronary artery disease    GERD (gastroesophageal reflux disease)    Ischemic cardiomyopathy 10/2017   Myocardial infarction acute ([place]) 10/2017   Thyroid disease    hypo   VF (ventricular fibrillation) ([place])     Past Surgical History:  Procedure Laterality Date   CORONARY STENT INTERVENTION N/A 11/29/2017   Procedure: CORONARY STENT INTERVENTION;  Surgeon: Belva Crome, MD;  Location: [facility];  Service: Cardiovascular;  Laterality: N/A;   CORONARY/GRAFT ACUTE MI REVASCULARIZATION N/A 11/29/2017   Procedure: Coronary/Graft Acute MI Revascularization;  Surgeon: Belva Crome, MD;  Location: [facility];  Service: Cardiovascular;  Laterality: N/A;   LEFT HEART CATH AND CORONARY ANGIOGRAPHY N/A 11/29/2017   Procedure: LEFT HEART CATH AND CORONARY ANGIOGRAPHY;  Surgeon: Belva Crome, MD;  Location: [facility];  Service: Cardiovascular;  Laterality: N/A;   LUMBAR DISC SURGERY     L5-S1    Social History   Socioeconomic History   Marital status: Married    Spouse name: Not on file   Number of children: 1   Years of education: Not on file   Highest education level: Not on file  Occupational History   Occupation: Mail carrier    Employer: [employer]  Tobacco Use   Smoking status: Former    Packs/day: 0.50    Years: 20.00    Total pack  years: 10.00    Types: Cigarettes    Quit date: 11/28/2017    Years since quitting: 5.0   Smokeless tobacco: Never  Vaping Use   Vaping Use: Never used  Substance and Sexual Activity   Alcohol use: Yes    Alcohol/week: 28.0 standard drinks of alcohol    Types: 28 Cans of beer per week    Comment: 4 16 oz beers nightly last drink last night 02/13/21   Drug  use: No   Sexual activity: Yes    Comment: ptp states she cannot be pregnant, but not using anything to not get prenant  Other Topics Concern   Not on file  Social History Narrative   Married with one child.   Social Determinants of Health   Financial Resource Strain: Not on file  Food Insecurity: Not on file  Transportation Needs: Not on file  Physical Activity: Not on file  Stress: Stress Concern Present (11/29/2017)   [place]    Feeling of Stress : Very much  Social Connections: Not on file  Intimate Partner Violence: Not on file    Outpatient Encounter Medications as of 12/09/2022  Medication Sig   atorvastatin (LIPITOR) 40 MG tablet TAKE 1 TABLET BY MOUTH EVERY DAY   clopidogrel (PLAVIX) 75 MG tablet TAKE 1 TABLET BY MOUTH EVERY DAY   escitalopram (LEXAPRO) 20 MG tablet Take 1 tablet (20 mg total) by mouth daily.   fluticasone (FLONASE) 50 MCG/ACT nasal spray SPRAY 2 SPRAYS INTO EACH NOSTRIL EVERY DAY   guaiFENesin (MUCINEX) 600 MG 12 hr tablet Take 1 tablet (600 mg total) by mouth 2 (two) times daily for 10 days.   levothyroxine (SYNTHROID) 88 MCG tablet TAKE 1 TABLET BY MOUTH EVERY DAY   metoprolol succinate (TOPROL-XL) 25 MG 24 hr tablet TAKE 1 TABLET BY MOUTH EVERY DAY   nitroGLYCERIN (NITROSTAT) 0.4 MG SL tablet Place 1 tablet (0.4 mg total) under the tongue every 5 (five) minutes as needed for chest pain.   pantoprazole (PROTONIX) 40 MG tablet TAKE 1 TABLET BY MOUTH TWICE A DAY   No facility-administered encounter medications on file as of 12/09/2022.    Allergies  Allergen Reactions   Sulfa Antibiotics Rash    Review of Systems  Constitutional:  Positive for activity change, appetite change, chills, fatigue and fever. Negative for diaphoresis, unexpected weight change and weight loss.  HENT:  Positive for congestion, postnasal drip and rhinorrhea. Negative for dental problem, drooling, ear  discharge, ear pain, facial swelling, hearing loss, mouth sores, nosebleeds, sinus pressure, sinus pain, sneezing, sore throat, tinnitus, trouble swallowing and voice change.   Eyes:  Negative for blurred vision, photophobia, pain and redness.  Respiratory:  Positive for cough. Negative for apnea, choking, chest tightness, shortness of breath, wheezing and stridor.   Cardiovascular:  Negative for chest pain.  Gastrointestinal:  Negative for abdominal pain, anorexia, diarrhea, nausea and vomiting.  Genitourinary:  Negative for decreased urine volume, difficulty urinating and dysuria.  Musculoskeletal:  Positive for myalgias. Negative for back pain and neck pain.  Skin:  Negative for rash.  Neurological:  Positive for headaches. Negative for dizziness, tingling, tremors, seizures, syncope, facial asymmetry, speech difficulty, weakness, light-headedness, numbness and loss of balance.  Psychiatric/Behavioral:  Negative for confusion. The patient does not have insomnia.   All other systems reviewed and are negative.       Objective:  BP 129/69   Pulse 87   Temp (!) 97.5  F (36.4 C) (Temporal)   Ht _0  (1.651 m)   Wt 157 lb 6.4 oz (71.4 kg)   SpO2 96%   BMI 26.19 kg/m    Wt Readings from Last 3 Encounters:  12/09/22 157 lb 6.4 oz (71.4 kg)  07/19/22 157 lb 12.8 oz (71.6 kg)  02/22/22 157 lb (71.2 kg)    Physical Exam Vitals and nursing note reviewed.  Constitutional:      General: She is not in acute distress.    Appearance: Normal appearance. She is well-developed and well-groomed. She is not ill-appearing, toxic-appearing or diaphoretic.  HENT:     Head: Normocephalic and atraumatic.     Jaw: There is normal jaw occlusion.     Right Ear: Hearing, ear canal and external ear normal. A middle ear effusion is present. Tympanic membrane is not erythematous.     Left Ear: Hearing, ear canal and external ear normal. A middle ear effusion is present. Tympanic membrane is not  erythematous.     Nose: Congestion present.     Right Sinus: No maxillary sinus tenderness or frontal sinus tenderness.     Left Sinus: No maxillary sinus tenderness or frontal sinus tenderness.     Mouth/Throat:     Lips: Pink.     Mouth: Mucous membranes are moist.     Pharynx: Oropharynx is clear. Uvula midline. Posterior oropharyngeal erythema present. No pharyngeal swelling, oropharyngeal exudate or uvula swelling.     Tonsils: No tonsillar exudate or tonsillar abscesses.  Eyes:     General: Lids are normal.     Extraocular Movements: Extraocular movements intact.     Conjunctiva/sclera: Conjunctivae normal.     Pupils: Pupils are equal, round, and reactive to light.  Neck:     Thyroid: No thyroid mass, thyromegaly or thyroid tenderness.     Vascular: No carotid bruit or JVD.     Trachea: Trachea and phonation normal.  Cardiovascular:     Rate and Rhythm: Normal rate and regular rhythm.     Chest Wall: PMI is not displaced.     Pulses: Normal pulses.     Heart sounds: Normal heart sounds. No murmur heard.    No friction rub. No gallop.  Pulmonary:     Effort: Pulmonary effort is normal. No respiratory distress.     Breath sounds: Normal breath sounds. No wheezing.  Abdominal:     General: There is no abdominal bruit.     Palpations: There is no hepatomegaly or splenomegaly.  Musculoskeletal:        General: Normal range of motion.     Cervical back: Normal range of motion and neck supple.     Right lower leg: No edema.     Left lower leg: No edema.  Lymphadenopathy:     Cervical: No cervical adenopathy.  Skin:    General: Skin is warm and dry.     Capillary Refill: Capillary refill takes less than 2 seconds.     Coloration: Skin is not cyanotic, jaundiced or pale.     Findings: No rash.  Neurological:     General: No focal deficit present.     Mental Status: She is alert and oriented to person, place, and time.     Sensory: Sensation is intact.     Motor: Motor  function is intact.     Coordination: Coordination is intact.     Gait: Gait is intact.     Deep Tendon Reflexes: Reflexes are normal and  symmetric.  Psychiatric:        Attention and Perception: Attention and perception normal.        Mood and Affect: Mood and affect normal.        Speech: Speech normal.        Behavior: Behavior normal. Behavior is cooperative.        Thought Content: Thought content normal.        Cognition and Memory: Cognition and memory normal.        Judgment: Judgment normal.     Results for orders placed or performed in visit on 07/19/22  CMP14+EGFR  Result Value Ref Range   Glucose 94 70 - 99 mg/dL   BUN 14 6 - 24 mg/dL   Creatinine, Ser 0.87 0.57 - 1.00 mg/dL   eGFR 80 >59 mL/min/1.73   BUN/Creatinine Ratio 16 9 - 23   Sodium 144 134 - 144 mmol/L   Potassium 4.3 3.5 - 5.2 mmol/L   Chloride 106 96 - 106 mmol/L   CO2 25 20 - 29 mmol/L   Calcium 9.6 8.7 - 10.2 mg/dL   Total Protein 6.8 6.0 - 8.5 g/dL   Albumin 4.6 3.8 - 4.9 g/dL   Globulin, Total 2.2 1.5 - 4.5 g/dL   Albumin/Globulin Ratio 2.1 1.2 - 2.2   Bilirubin Total 0.2 0.0 - 1.2 mg/dL   Alkaline Phosphatase 71 44 - 121 IU/L   AST 22 0 - 40 IU/L   ALT 28 0 - 32 IU/L  CBC with Differential/Platelet  Result Value Ref Range   WBC 7.9 3.4 - 10.8 x10E3/uL   RBC 4.15 3.77 - 5.28 x10E6/uL   Hemoglobin 12.7 11.1 - 15.9 g/dL   Hematocrit 37.1 34.0 - 46.6 %   MCV 89 79 - 97 fL   MCH 30.6 26.6 - 33.0 pg   MCHC 34.2 31.5 - 35.7 g/dL   RDW 13.5 11.7 - 15.4 %   Platelets 283 150 - 450 x10E3/uL   Neutrophils 52 Not Estab. %   Lymphs 33 Not Estab. %   Monocytes 6 Not Estab. %   Eos 9 Not Estab. %   Basos 0 Not Estab. %   Neutrophils Absolute 4.0 1.4 - 7.0 x10E3/uL   Lymphocytes Absolute 2.6 0.7 - 3.1 x10E3/uL   Monocytes Absolute 0.5 0.1 - 0.9 x10E3/uL   EOS (ABSOLUTE) 0.7 (H) 0.0 - 0.4 x10E3/uL   Basophils Absolute 0.0 0.0 - 0.2 x10E3/uL   Immature Granulocytes 0 Not Estab. %   Immature Grans  (Abs) 0.0 0.0 - 0.1 x10E3/uL       Pertinent labs & imaging results that were available during my care of the patient were reviewed by me and considered in my medical decision making.  Assessment & Plan:  Agustina was seen today for fever, chills, generalized body aches, nasal congestion and headache.  Diagnoses and all orders for this visit:  Acute cough URI cough and congestion  Influenza negative. COVID and RSV pending. Symptomatic care discussed in detail. OTC Coricidin HBP along with Tylenol. Mucinex with plenty of water. Will start antivirals if COVID positive. Report new, worsening, or persistent symptoms.  -     Veritor Flu A/B Waived -     COVID-19, Flu A+B and RSV -     guaiFENesin (MUCINEX) 600 MG 12 hr tablet; Take 1 tablet (600 mg total) by mouth 2 (two) times daily for 10 days.     Continue all other maintenance medications.  Follow up plan: Return  if symptoms worsen or fail to improve.   Continue healthy lifestyle choices, including diet (rich in fruits, vegetables, and lean proteins, and low in salt and simple carbohydrates) and exercise (at least 30 minutes of moderate physical activity daily).  Educational handout given for viral respiratory infection   The above assessment and management plan was discussed with the patient. The patient verbalized understanding of and has agreed to the management plan. Patient is aware to call the clinic if they develop any new symptoms or if symptoms persist or worsen. Patient is aware when to return to the clinic for a follow-up visit. Patient educated on when it is appropriate to go to the emergency department.   Monia Pouch, FNP-C [facility] [phone]

## 2022-12-14 LAB — COVID-19, FLU A+B AND RSV

## 2023-02-13 ENCOUNTER — Other Ambulatory Visit: Payer: Self-pay | Admitting: Family

## 2023-02-14 NOTE — Telephone Encounter (Signed)
30 days sent in NTBS

## 2023-02-15 ENCOUNTER — Encounter: Payer: Self-pay | Admitting: Family

## 2023-02-15 NOTE — Telephone Encounter (Signed)
Called pt to schedule appt no answer and no VM Letter mailed

## 2023-02-21 ENCOUNTER — Other Ambulatory Visit: Payer: Self-pay

## 2023-02-21 MED ORDER — PANTOPRAZOLE SODIUM 40 MG PO TBEC: 40.0000 mg | DELAYED_RELEASE_TABLET | Freq: Two times a day (BID) | ORAL | 0 refills | Status: DC

## 2023-02-21 MED ORDER — CLOPIDOGREL BISULFATE 75 MG PO TABS: 75.0000 mg | ORAL_TABLET | Freq: Every day | ORAL | 0 refills | Status: DC

## 2023-02-24 NOTE — Progress Notes (Signed)
Cardiology Office Note:    Date:  02/25/2023   ID:  Sarah Fox, DOB Oct 11, 1966, MRN [id number]  PCP:  Sharion Balloon, [place] [facility] Cardiologist:  Lenna Sciara, MD Referring MD: Sharion Balloon, FNP   Chief Complaint/Reason for Referral: Cardiology follow-up  ASSESSMENT:    1. Acute ST elevation myocardial infarction (STEMI) of anterior wall (HCC)   2. Hyperlipidemia LDL goal <70   3. Ischemic cardiomyopathy     PLAN:    In order of problems listed above: 1.  Coronary artery disease: Continue Plavix monotherapy and statin as well as beta-blocker given abnormal ejection fraction. 2.  Hyperlipidemia: Will check lipid panel, LFTs, LP(a) today.  Given her history of myocardial infarction goal LDL is less than 55. 3.  Ischemic cardiomyopathy: We will start Jardiance 10 mg daily, continue Toprol-XL 25 mg today, and start losartan 12.5 mg at bedtime; check echocardiogram in 3 months.             Dispo:  Return in about 1 year (around 02/25/2024).      Medication Adjustments/Labs and Tests Ordered: Current medicines are reviewed at length with the patient today.  Concerns regarding medicines are outlined above.  The following changes have been made:     Labs/tests ordered: Orders Placed This Encounter  Procedures   ALT   Lipid panel   Lipoprotein A (LPA)   EKG 12-Lead   ECHOCARDIOGRAM COMPLETE    Medication Changes: Meds ordered this encounter  Medications   DISCONTD: losartan (COZAAR) 25 MG tablet    Sig: Take 1 tablet (25 mg total) by mouth daily.    Dispense:  45 tablet    Refill:  3   empagliflozin (JARDIANCE) 10 MG TABS tablet    Sig: Take 1 tablet (10 mg total) by mouth daily before breakfast.    Dispense:  90 tablet    Refill:  3   losartan (COZAAR) 25 MG tablet    Sig: Take 0.5 tablets (12.5 mg total) by mouth daily.    Dispense:  45 tablet    Refill:  3   pantoprazole (PROTONIX) 40 MG tablet    Sig: Take 1 tablet (40 mg  total) by mouth 2 (two) times daily.    Dispense:  60 tablet    Refill:  0    Please keep your upcoming appointment for any future refills. Thank you.     Current medicines are reviewed at length with the patient today.  The patient does not have concerns regarding medicines.   History of Present Illness:    FOCUSED PROBLEM LIST:   1.  Anterior ST elevation myocardial infarction status post PCI of the proximal LAD 2018 2.  Hyperlipidemia 3.  Hypertension 4.  Hypothyroidism 5.  Ischemic cardiomyopathy ejection fraction 45 to 50% TTE 2022 6.  GERD  The patient is a 57 y.o. female with the indicated medical history here for routine cardiology follow-up.  She was last seen in February.  At that time she was doing well.  An echocardiogram showed improved anterior wall heart function and Coumadin was discontinued.  She denied any cardiovascular complaints at that visit.  The patient continues to do well.  She works as a Dispensing optician.  She denies any chest pain or significant shortness of breath.  She denies any peripheral edema or paroxysmal nocturnal dyspnea.  She remains abstaining from tobacco abuse.  She has not required any sublingual nitroglycerin.  She has been completely compliant  with her medications.          Current Medications: Current Meds  Medication Sig   atorvastatin (LIPITOR) 40 MG tablet TAKE 1 TABLET BY MOUTH EVERY DAY   clopidogrel (PLAVIX) 75 MG tablet Take 1 tablet (75 mg total) by mouth daily.   empagliflozin (JARDIANCE) 10 MG TABS tablet Take 1 tablet (10 mg total) by mouth daily before breakfast.   escitalopram (LEXAPRO) 20 MG tablet Take 1 tablet (20 mg total) by mouth daily.   fluticasone (FLONASE) 50 MCG/ACT nasal spray SPRAY 2 SPRAYS INTO EACH NOSTRIL EVERY DAY   levothyroxine (SYNTHROID) 88 MCG tablet Take 1 tablet (88 mcg total) by mouth daily. Needs office visit for further refills   metoprolol succinate (TOPROL-XL) 25 MG 24 hr tablet TAKE 1 TABLET BY  MOUTH EVERY DAY   nitroGLYCERIN (NITROSTAT) 0.4 MG SL tablet Place 1 tablet (0.4 mg total) under the tongue every 5 (five) minutes as needed for chest pain.   [DISCONTINUED] losartan (COZAAR) 25 MG tablet Take 1 tablet (25 mg total) by mouth daily.   [DISCONTINUED] pantoprazole (PROTONIX) 40 MG tablet Take 1 tablet (40 mg total) by mouth 2 (two) times daily.     Allergies:    Sulfa antibiotics   Social History:   Social History   Tobacco Use   Smoking status: Former    Packs/day: 0.50    Years: 20.00    Total pack years: 10.00    Types: Cigarettes    Quit date: 11/28/2017    Years since quitting: 5.2   Smokeless tobacco: Never  Vaping Use   Vaping Use: Never used  Substance Use Topics   Alcohol use: Yes    Alcohol/week: 28.0 standard drinks of alcohol    Types: 28 Cans of beer per week    Comment: 4 16 oz beers nightly last drink last night 02/13/21   Drug use: No     Family Hx: Family History  Problem Relation Age of Onset   Breast cancer Mother 90   Breast cancer Sister 40   Heart disease Brother    Other Father    Colon cancer Neg Hx    Esophageal cancer Neg Hx    Rectal cancer Neg Hx    Stomach cancer Neg Hx      Review of Systems:   Please see the history of present illness.    All other systems reviewed and are negative.     EKGs/Labs/Other Test Reviewed:    EKG:  EKG performed today that I personally reviewed demonstrates normal sinus rhythm with poor R wave progression.  Prior CV studies: Cardiac Studies & Procedures   CARDIAC CATHETERIZATION  CARDIAC CATHETERIZATION 11/29/2017  Narrative  Acute anterior myocardial infarction due to occlusion of the proximal LAD with 99% thrombus contained obstruction with TIMI grade II flow on initial angiography.  Ventricular fibrillation requiring electrical conversion x3 in succession prior to PCI.  CPR was not necessary.  Hypokalemia of 3.0 was identified.  Successful LAD PCI following aspiration  thrombectomy reducing 99% stenosis with TIMI grade II antegrade flow to 0% with TIMI grade III flow.  The 3.5 x 22 mm Onyx was postdilated to 3.75 mm at high pressure.  Normal right coronary artery.  Normal left main coronary artery.  Normal circumflex coronary artery.  Acute diastolic heart failure documented by moderate elevation in LVEDP.  RECOMMENDATIONS:   Aggrastat times 18 hours  Aspirin and Brilinta times 12 months  Risk factor modification as needed: Hemoglobin A1c,  lipid panel, have been ordered.  Beta-blocker and ACE inhibitor therapy should be started once hemodynamics were stable.  Discharge at 48 hours if remains stable.  Findings Coronary Findings Diagnostic  Dominance: Right  Left Anterior Descending Ost LAD to Prox LAD lesion is 99% stenosed.  Intervention  Ost LAD to Prox LAD lesion Stent A stent was successfully placed. Post-Intervention Lesion Assessment There is a 0% residual stenosis post intervention.   STRESS TESTS  NM MYOCAR MULTI W/SPECT W 02/15/2021  Narrative CLINICAL DATA:  Chest pain. Shortness of breath. History of MI with VF arrest.  EXAM: MYOCARDIAL IMAGING WITH SPECT (REST AND PHARMACOLOGIC-STRESS)  GATED LEFT VENTRICULAR WALL MOTION STUDY  LEFT VENTRICULAR EJECTION FRACTION  TECHNIQUE: Standard myocardial SPECT imaging was performed after resting intravenous injection of 9.7 mCi Tc-69mtetrofosmin. Subsequently, intravenous infusion of Lexiscan was performed under the supervision of the Cardiology staff. At peak effect of the drug, 32 mCi Tc-952metrofosmin was injected intravenously and standard myocardial SPECT imaging was performed. Quantitative gated imaging was also performed to evaluate left ventricular wall motion, and estimate left ventricular ejection fraction.  COMPARISON:  None.  FINDINGS: Perfusion: No decreased activity in the left ventricle on stress imaging to suggest reversible ischemia or  infarction.  Wall Motion: Normal left ventricular wall motion. No left ventricular dilation.  Left Ventricular Ejection Fraction: 52 %  End diastolic volume 11123456l  End systolic volume 54 ml  IMPRESSION: 1. No reversible ischemia or infarction.  2. Normal left ventricular wall motion.  3. Left ventricular ejection fraction 52%. Of note, the end-diastolic and end systolic volumes are mildly abnormal.  4. Non invasive risk stratification*: Low  *2012 Appropriate Use Criteria for Coronary Revascularization Focused Update: J Am Coll Cardiol. 20N6492421http://content.onairportbarriers.comspx?articleid=1201161   Electronically Signed By: DaDorise BullionII M.D On: 02/15/2021 15:32   ECHOCARDIOGRAM  ECHOCARDIOGRAM LIMITED 05/18/2021  Narrative ECHOCARDIOGRAM LIMITED REPORT    Patient Name:   Sarah SHONTZate of Exam: 05/18/2021 Medical Rec #:  [id number]     Height:       65.0 in Accession #:    [id number]    Weight:       160.0 lb Date of Birth:  [birth year]     BSA:          1.799 m Patient Age:    [age]        BP:           120/68 mmHg Patient Gender: F               HR:           76 bpm. [facility]: [facility] Echo, Limited Color Doppler, Cardiac Doppler and Intracardiac Opacification Agent  Indications:    Left Ventricular Mural Thrombus  History:        Patient has prior history of Echocardiogram examinations, most recent 02/15/2021. Ischemic Cardiomyopathy; Previous Myocardial Infarction and CAD.  Sonographer:    CaMikki SanteeDCS Referring Phys: 49[place] 1. Basal and mid septum akinetic apical hypokinesis Apical partially calcified band and shadowing artifact Doubt associated thrombus Can consider f/u cardiac MRI with long TI post gadolinium imaging if concern is high . Left ventricular ejection fraction, by estimation, is 45 to 50%. The left ventricle has mildly decreased  function. The left ventricle demonstrates regional wall motion abnormalities (see scoring diagram/findings for description). Left ventricular diastolic parameters  were normal. 2. Right ventricular systolic function is normal. The right ventricular size is normal. 3. The mitral valve is normal in structure. Trivial mitral valve regurgitation. No evidence of mitral stenosis. 4. The aortic valve is normal in structure. Aortic valve regurgitation is not visualized. No aortic stenosis is present. 5. The inferior vena cava is normal in size with greater than 50% respiratory variability, suggesting right atrial pressure of 3 mmHg.  FINDINGS Left Ventricle: Basal and mid septum akinetic apical hypokinesis Apical partially calcified band and shadowing artifact Doubt associated thrombus Can consider f/u cardiac MRI with long TI post gadolinium imaging if concern is high. Left ventricular ejection fraction, by estimation, is 45 to 50%. The left ventricle has mildly decreased function. The left ventricle demonstrates regional wall motion abnormalities. Definity contrast agent was given IV to delineate the left ventricular endocardial borders. The left ventricular internal cavity size was normal in size. There is no left ventricular hypertrophy. Left ventricular diastolic parameters were normal.  Right Ventricle: The right ventricular size is normal. No increase in right ventricular wall thickness. Right ventricular systolic function is normal.  Left Atrium: Left atrial size was normal in size.  Right Atrium: Right atrial size was normal in size.  Pericardium: There is no evidence of pericardial effusion.  Mitral Valve: The mitral valve is normal in structure. Trivial mitral valve regurgitation. No evidence of mitral valve stenosis.  Tricuspid Valve: The tricuspid valve is normal in structure. Tricuspid valve regurgitation is trivial. No evidence of tricuspid stenosis.  Aortic Valve: The aortic valve is  normal in structure. Aortic valve regurgitation is not visualized. No aortic stenosis is present.  Pulmonic Valve: The pulmonic valve was normal in structure. Pulmonic valve regurgitation is not visualized. No evidence of pulmonic stenosis.  Aorta: The aortic root is normal in size and structure.  Venous: The inferior vena cava is normal in size with greater than 50% respiratory variability, suggesting right atrial pressure of 3 mmHg.  IAS/Shunts: No atrial level shunt detected by color flow Doppler.  LEFT VENTRICLE PLAX 2D LVIDd:         5.50 cm     Diastology LVIDs:         4.50 cm     LV e' medial:    5.87 cm/s LV PW:         0.80 cm     LV E/e' medial:  12.8 LV IVS:        0.70 cm     LV e' lateral:   6.96 cm/s LVOT diam:     2.20 cm     LV E/e' lateral: 10.8 LVOT Area:     3.80 cm  LV Volumes (MOD) LV vol d, MOD A2C: 89.4 ml LV vol d, MOD A4C: 75.7 ml LV vol s, MOD A2C: 47.6 ml LV vol s, MOD A4C: 39.3 ml LV SV MOD A2C:     41.8 ml LV SV MOD A4C:     75.7 ml LV SV MOD BP:      37.9 ml  LEFT ATRIUM         Index LA diam:    3.50 cm 1.95 cm/m  AORTA Ao Root diam: 2.80 cm  MITRAL VALVE               TRICUSPID VALVE MV Area (PHT): 3.12 cm    TR Peak grad:   23.4 mmHg MV Decel Time: 243 msec    TR Vmax:  242.00 cm/s MV E velocity: 74.90 cm/s MV A velocity: 87.00 cm/s  SHUNTS MV E/A ratio:  0.86        Systemic Diam: 2.20 cm  Jenkins Rouge MD Electronically signed by Jenkins Rouge MD Signature Date/Time: 05/18/2021/2:27:03 PM    Final    MONITORS  CARDIAC EVENT MONITOR 03/25/2021  Narrative  Basic underlying rhythm is normal sinus rhythm.  No significant burden of atrial or ventricular premature beats were noted.  No sustained tacky arrhythmias  Palpitations did not correlate with rhythm disturbance.  Overall normal study without malignant arrhythmias or bradycardia arrhythmias.           Other studies Reviewed: Review of the additional  studies/records demonstrates: Imaging studies reviewed demonstrate no aortic atherosclerosis  Recent Labs: 07/19/2022: ALT 28; BUN 14; Creatinine, Ser 0.87; Hemoglobin 12.7; Platelets 283; Potassium 4.3; Sodium 144   Recent Lipid Panel Lab Results  Component Value Date/Time   CHOL 121 01/19/2022 02:17 PM   TRIG 90 01/19/2022 02:17 PM   HDL 48 01/19/2022 02:17 PM   LDLCALC 56 01/19/2022 02:17 PM    Risk Assessment/Calculations:                Physical Exam:    VS:  BP 122/82   Pulse 61   Ht '5\' 5"'$  (1.651 m)   Wt 153 lb 3.2 oz (69.5 kg)   SpO2 99%   BMI 25.49 kg/m    Wt Readings from Last 3 Encounters:  02/25/23 153 lb 3.2 oz (69.5 kg)  12/09/22 157 lb 6.4 oz (71.4 kg)  07/19/22 157 lb 12.8 oz (71.6 kg)    GENERAL:  No apparent distress, AOx3 HEENT:  No carotid bruits, +2 carotid impulses, no scleral icterus CAR: RRR no murmurs, gallops, rubs, or thrills RES:  Clear to auscultation bilaterally ABD:  Soft, nontender, nondistended, positive bowel sounds x 4 VASC:  +2 radial pulses, +2 carotid pulses, palpable pedal pulses NEURO:  CN 2-12 grossly intact; motor and sensory grossly intact PSYCH:  No active depression or anxiety EXT:  No edema, ecchymosis, or cyanosis  Signed, Early Osmond, MD  02/25/2023 4:24 PM    [address], [place], [place]  [address] Phone: [phone]; Fax: [phone]   Note:  This document was prepared using Dragon voice recognition software and may include unintentional dictation errors.

## 2023-02-25 ENCOUNTER — Encounter: Payer: Self-pay | Admitting: Internal Medicine

## 2023-02-25 ENCOUNTER — Ambulatory Visit: Payer: Federal, State, Local not specified - PPO | Attending: Internal Medicine | Admitting: Internal Medicine

## 2023-02-25 VITALS — BP 122/82 | HR 61 | Ht 65.0 in | Wt 153.2 lb

## 2023-02-25 DIAGNOSIS — I255 Ischemic cardiomyopathy: Secondary | ICD-10-CM

## 2023-02-25 DIAGNOSIS — I2109 ST elevation (STEMI) myocardial infarction involving other coronary artery of anterior wall: Secondary | ICD-10-CM | POA: Diagnosis not present

## 2023-02-25 DIAGNOSIS — E785 Hyperlipidemia, unspecified: Secondary | ICD-10-CM

## 2023-02-25 MED ORDER — LOSARTAN POTASSIUM 25 MG PO TABS: 12.5000 mg | ORAL_TABLET | Freq: Every day | ORAL | 3 refills | Status: DC

## 2023-02-25 MED ORDER — PANTOPRAZOLE SODIUM 40 MG PO TBEC: 40.0000 mg | DELAYED_RELEASE_TABLET | Freq: Two times a day (BID) | ORAL | 0 refills | Status: DC

## 2023-02-25 MED ORDER — EMPAGLIFLOZIN 10 MG PO TABS: 10.0000 mg | ORAL_TABLET | Freq: Every day | ORAL | 3 refills | Status: DC

## 2023-02-25 MED ORDER — LOSARTAN POTASSIUM 25 MG PO TABS: 25.0000 mg | ORAL_TABLET | Freq: Every day | ORAL | 3 refills | Status: DC

## 2023-02-25 NOTE — Patient Instructions (Signed)
Medication Instructions:  Your physician has recommended you make the following change in your medication:   Stop taking protonix Start taking Losartan 12.5 mg at bedtime  Start taking Jardiance 10 mg daily  *If you need a refill on your cardiac medications before your next appointment, please call your pharmacy*   Lab Work: Lipids, ALT, Lpa  If you have labs (blood work) drawn today and your tests are completely normal, you will receive your results only by: [place] (if you have MyChart) OR A paper copy in the mail If you have any lab test that is abnormal or we need to change your treatment, we will call you to review the results.   Testing/Procedures: Your physician has requested that you have an echocardiogram. Echocardiography is a painless test that uses sound waves to create images of your heart. It provides your doctor with information about the size and shape of your heart and how well your heart's chambers and valves are working. This procedure takes approximately one hour. There are no restrictions for this procedure. Please do NOT wear cologne, perfume, aftershave, or lotions (deodorant is allowed). Please arrive 15 minutes prior to your appointment time.    Follow-Up: At [facility], you and your health needs are our priority.  As part of our continuing mission to provide you with exceptional heart care, we have created designated Provider Care Teams.  These Care Teams include your primary Cardiologist (physician) and Advanced Practice Providers (APPs -  Physician Assistants and Nurse Practitioners) who all work together to provide you with the care you need, when you need it.   Your next appointment:   1 year(s)  Provider:   APP

## 2023-02-28 ENCOUNTER — Other Ambulatory Visit: Payer: Self-pay | Admitting: Family

## 2023-03-01 ENCOUNTER — Other Ambulatory Visit: Payer: Self-pay | Admitting: Internal Medicine

## 2023-03-01 LAB — LIPID PANEL
Chol/HDL Ratio: 2.6 ratio (ref 0.0–4.4)
Cholesterol, Total: 135 mg/dL (ref 100–199)
HDL: 51 mg/dL (ref >39)
LDL Chol Calc (NIH): 63 mg/dL (ref 0–99)
Triglycerides: 116 mg/dL (ref 0–149)
VLDL Cholesterol Cal: 21 mg/dL (ref 5–40)

## 2023-03-01 LAB — LIPOPROTEIN A (LPA): Lipoprotein (a): <8.4 nmol/L (ref <75.0)

## 2023-03-01 LAB — ALT: ALT: 24 IU/L (ref 0–32)

## 2023-03-02 ENCOUNTER — Other Ambulatory Visit: Payer: Self-pay

## 2023-03-02 MED ORDER — ATORVASTATIN CALCIUM 40 MG PO TABS: 40.0000 mg | ORAL_TABLET | Freq: Every day | ORAL | 3 refills | Status: DC

## 2023-03-03 ENCOUNTER — Telehealth: Payer: Self-pay

## 2023-03-03 NOTE — Telephone Encounter (Signed)
**Note De-Identified Ezana Hubbert Obfuscation** Per letter from [facility] Emillee Talsma covermymeds: Re: Oliver Pila, [birth year], [place], [place] [address] This is to inform you that your Prior Authorization request for the above member's Jardiance '10MG'$  OR TABS has been approved. If you are changing the member's therapy the previously approved therapy will be canceled and replaced. The authorization is valid from 02/01/2023 through 03/02/2024. A letter of explanation will also be mailed to the patient.  [id number] [place], [place] [address](Ph: [phone] is aware of this approval Kiah Vanalstine VM and I did request that they fill the RX and to notify the pt when ready for pick up.

## 2023-03-07 ENCOUNTER — Ambulatory Visit: Payer: Federal, State, Local not specified - PPO | Admitting: Internal Medicine

## 2023-03-11 ENCOUNTER — Encounter: Payer: Self-pay | Admitting: Family

## 2023-03-11 ENCOUNTER — Ambulatory Visit: Payer: Federal, State, Local not specified - PPO | Admitting: Family

## 2023-03-11 VITALS — BP 125/73 | HR 74 | Temp 97.2°F | Ht 65.0 in | Wt 153.8 lb

## 2023-03-11 DIAGNOSIS — Z87891 Personal history of nicotine dependence: Secondary | ICD-10-CM

## 2023-03-11 DIAGNOSIS — F411 Generalized anxiety disorder: Secondary | ICD-10-CM | POA: Diagnosis not present

## 2023-03-11 DIAGNOSIS — I252 Old myocardial infarction: Secondary | ICD-10-CM | POA: Insufficient documentation

## 2023-03-11 DIAGNOSIS — I2583 Coronary atherosclerosis due to lipid rich plaque: Secondary | ICD-10-CM

## 2023-03-11 DIAGNOSIS — Z0000 Encounter for general adult medical examination without abnormal findings: Secondary | ICD-10-CM | POA: Diagnosis not present

## 2023-03-11 DIAGNOSIS — E785 Hyperlipidemia, unspecified: Secondary | ICD-10-CM

## 2023-03-11 DIAGNOSIS — I5042 Chronic combined systolic (congestive) and diastolic (congestive) heart failure: Secondary | ICD-10-CM

## 2023-03-11 DIAGNOSIS — M1612 Unilateral primary osteoarthritis, left hip: Secondary | ICD-10-CM

## 2023-03-11 DIAGNOSIS — E039 Hypothyroidism, unspecified: Secondary | ICD-10-CM

## 2023-03-11 DIAGNOSIS — I2510 Atherosclerotic heart disease of native coronary artery without angina pectoris: Secondary | ICD-10-CM

## 2023-03-11 DIAGNOSIS — F321 Major depressive disorder, single episode, moderate: Secondary | ICD-10-CM

## 2023-03-11 DIAGNOSIS — K219 Gastro-esophageal reflux disease without esophagitis: Secondary | ICD-10-CM

## 2023-03-11 DIAGNOSIS — Z0001 Encounter for general adult medical examination with abnormal findings: Secondary | ICD-10-CM

## 2023-03-11 NOTE — Progress Notes (Signed)
Subjective:    Patient ID: Sarah Fox, female    DOB: [birth year], 57 y.o.   MRN: [id number]  Chief Complaint  Patient presents with   Medical Management of Chronic Issues   PT presents to the office today for CPE and chronic follow up. She is followed by Cardiologists annually for CAD and STEMI in 2018.   Congestive Heart Failure Presents for follow-up visit. Associated symptoms include fatigue. Pertinent negatives include no chest pressure or edema. The symptoms have been stable.  Gastroesophageal Reflux She complains of belching, heartburn and a hoarse voice. This is a chronic problem. The current episode started more than 1 year ago. The problem occurs occasionally. The problem has been resolved. Associated symptoms include fatigue. She has tried a PPI for the symptoms. The treatment provided moderate relief.  Thyroid Problem Presents for follow-up visit. Symptoms include diarrhea, fatigue and hoarse voice. Patient reports no anxiety, constipation, depressed mood or dry skin. The symptoms have been stable. Her past medical history is significant for hyperlipidemia.  Arthritis Presents for follow-up visit. She complains of pain and stiffness. The symptoms have been stable. Affected locations include the left knee, right knee, right MCP and left MCP. Associated symptoms include diarrhea and fatigue.  Hyperlipidemia This is a chronic problem. The current episode started more than 1 year ago. The problem is controlled. Recent lipid tests were reviewed and are normal. Current antihyperlipidemic treatment includes statins. The current treatment provides moderate improvement of lipids. Risk factors for coronary artery disease include dyslipidemia, hypertension and a sedentary lifestyle.  Anxiety Presents for follow-up visit. Symptoms include excessive worry, irritability and restlessness. Patient reports no depressed mood or nervous/anxious behavior. Symptoms occur rarely. The severity of  symptoms is mild.    Depression        This is a chronic problem.  Associated symptoms include fatigue and restlessness.  Associated symptoms include no helplessness, no hopelessness and not sad.  Past treatments include SSRIs - Selective serotonin reuptake inhibitors.  Past medical history includes thyroid problem and anxiety.       Review of Systems  Constitutional:  Positive for fatigue and irritability.  HENT:  Positive for hoarse voice.   Gastrointestinal:  Positive for diarrhea and heartburn. Negative for constipation.  Musculoskeletal:  Positive for arthritis and stiffness.  Psychiatric/Behavioral:  Positive for depression. The patient is not nervous/anxious.   All other systems reviewed and are negative.  Family History  Problem Relation Age of Onset   Breast cancer Mother 79   Breast cancer Sister 14   Heart disease Brother    Other Father    Colon cancer Neg Hx    Esophageal cancer Neg Hx    Rectal cancer Neg Hx    Stomach cancer Neg Hx    Social History   Socioeconomic History   Marital status: Married    Spouse name: Not on file   Number of children: 1   Years of education: Not on file   Highest education level: Not on file  Occupational History   Occupation: Mail carrier    Fish farm manager: [employer]  Tobacco Use   Smoking status: Former    Packs/day: 0.50    Years: 20.00    Additional pack years: 0.00    Total pack years: 10.00    Types: Cigarettes    Quit date: 11/28/2017    Years since quitting: 5.2   Smokeless tobacco: Never  Vaping Use   Vaping Use: Never used  Substance and  Sexual Activity   Alcohol use: Yes    Alcohol/week: 28.0 standard drinks of alcohol    Types: 28 Cans of beer per week    Comment: 4 16 oz beers nightly last drink last night 02/13/21   Drug use: No   Sexual activity: Yes    Comment: ptp states she cannot be pregnant, but not using anything to not get prenant  Other Topics Concern   Not on file  Social History Narrative    Married with one child.   Social Determinants of Health   Financial Resource Strain: Not on file  Food Insecurity: Not on file  Transportation Needs: Not on file  Physical Activity: Not on file  Stress: Stress Concern Present (11/29/2017)   [place]    Feeling of Stress : Very much  Social Connections: Not on file       Objective:   Physical Exam Vitals reviewed.  Constitutional:      General: She is not in acute distress.    Appearance: She is well-developed.  HENT:     Head: Normocephalic and atraumatic.     Right Ear: Tympanic membrane normal.     Left Ear: Tympanic membrane normal.  Eyes:     Pupils: Pupils are equal, round, and reactive to light.  Neck:     Thyroid: No thyromegaly.  Cardiovascular:     Rate and Rhythm: Normal rate and regular rhythm.     Heart sounds: Normal heart sounds. No murmur heard. Pulmonary:     Effort: Pulmonary effort is normal. No respiratory distress.     Breath sounds: Normal breath sounds. No wheezing.  Abdominal:     General: Bowel sounds are normal. There is no distension.     Palpations: Abdomen is soft.     Tenderness: There is no abdominal tenderness.  Musculoskeletal:        General: No tenderness. Normal range of motion.     Cervical back: Normal range of motion and neck supple.  Skin:    General: Skin is warm and dry.  Neurological:     Mental Status: She is alert and oriented to person, place, and time.     Cranial Nerves: No cranial nerve deficit.     Deep Tendon Reflexes: Reflexes are normal and symmetric.  Psychiatric:        Behavior: Behavior normal.        Thought Content: Thought content normal.        Judgment: Judgment normal.       BP 125/73   Pulse 74   Temp (!) 97.2 F (36.2 C) (Temporal)   Ht 5\' 5"  (1.651 m)   Wt 153 lb 12.8 oz (69.8 kg)   SpO2 98%   BMI 25.59 kg/m      Assessment & Plan:  Sarah Fox comes in today  with chief complaint of Medical Management of Chronic Issues   Diagnosis and orders addressed:  1. Annual physical exam - CMP14+EGFR - CBC with Differential/Platelet - TSH  2. Former heavy tobacco smoker - CMP14+EGFR - CBC with Differential/Platelet  3. Depression, major, single episode, moderate (HCC) - CMP14+EGFR - CBC with Differential/Platelet  4. Hypothyroidism, unspecified type - CMP14+EGFR - CBC with Differential/Platelet - TSH  5. GAD (generalized anxiety disorder) - CMP14+EGFR - CBC with Differential/Platelet  6. Hyperlipidemia LDL goal <70 - CMP14+EGFR - CBC with Differential/Platelet  7. Chronic combined systolic and diastolic heart failure (HCC)  -  CMP14+EGFR - CBC with Differential/Platelet  8. Coronary artery disease due to lipid rich plaqu - CMP14+EGFR - CBC with Differential/Platelet  9. Gastroesophageal reflux disease, unspecified whether esophagitis present - CMP14+EGFR - CBC with Differential/Platelet  10. History of ST elevation myocardial infarction (STEMI) - CMP14+EGFR - CBC with Differential/Platelet  11. CAD in native artery - CMP14+EGFR - CBC with Differential/Platelet  12. Primary osteoarthritis of left hip - CMP14+EGFR - CBC with Differential/Platelet   Labs pending Health Maintenance reviewed Diet and exercise encouraged  Follow up plan: 6 months    Evelina Dun, FNP

## 2023-03-11 NOTE — Patient Instructions (Signed)

## 2023-03-12 LAB — CBC WITH DIFFERENTIAL/PLATELET
Basophils Absolute: 0.1 10*3/uL (ref 0.0–0.2)
Basos: 1 %
EOS (ABSOLUTE): 0.2 10*3/uL (ref 0.0–0.4)
Eos: 3 %
Hematocrit: 44.1 % (ref 34.0–46.6)
Hemoglobin: 14.3 g/dL (ref 11.1–15.9)
Immature Grans (Abs): 0 10*3/uL (ref 0.0–0.1)
Immature Granulocytes: 0 %
Lymphocytes Absolute: 2.5 10*3/uL (ref 0.7–3.1)
Lymphs: 33 %
MCH: 30.4 pg (ref 26.6–33.0)
MCHC: 32.4 g/dL (ref 31.5–35.7)
MCV: 94 fL (ref 79–97)
Monocytes Absolute: 0.4 10*3/uL (ref 0.1–0.9)
Monocytes: 6 %
Neutrophils Absolute: 4.4 10*3/uL (ref 1.4–7.0)
Neutrophils: 57 %
Platelets: 318 10*3/uL (ref 150–450)
RBC: 4.71 x10E6/uL (ref 3.77–5.28)
RDW: 13.3 % (ref 11.7–15.4)
WBC: 7.7 10*3/uL (ref 3.4–10.8)

## 2023-03-12 LAB — CMP14+EGFR
ALT: 25 IU/L (ref 0–32)
AST: 22 IU/L (ref 0–40)
Albumin/Globulin Ratio: 2.1 (ref 1.2–2.2)
Albumin: 5.1 g/dL — ABNORMAL HIGH (ref 3.8–4.9)
Alkaline Phosphatase: 94 IU/L (ref 44–121)
BUN/Creatinine Ratio: 16 (ref 9–23)
BUN: 15 mg/dL (ref 6–24)
Bilirubin Total: 0.5 mg/dL (ref 0.0–1.2)
CO2: 22 mmol/L (ref 20–29)
Calcium: 10.2 mg/dL (ref 8.7–10.2)
Chloride: 101 mmol/L (ref 96–106)
Creatinine, Ser: 0.91 mg/dL (ref 0.57–1.00)
Globulin, Total: 2.4 g/dL (ref 1.5–4.5)
Glucose: 88 mg/dL (ref 70–99)
Potassium: 4.4 mmol/L (ref 3.5–5.2)
Sodium: 141 mmol/L (ref 134–144)
Total Protein: 7.5 g/dL (ref 6.0–8.5)
eGFR: 75 mL/min/{1.73_m2} (ref >59)

## 2023-03-12 LAB — TSH: TSH: 2.44 u[IU]/mL (ref 0.450–4.500)

## 2023-04-09 ENCOUNTER — Other Ambulatory Visit: Payer: Self-pay | Admitting: Family

## 2023-04-15 ENCOUNTER — Other Ambulatory Visit: Payer: Self-pay | Admitting: Internal Medicine

## 2023-04-15 ENCOUNTER — Telehealth: Payer: Self-pay | Admitting: Internal Medicine

## 2023-04-15 MED ORDER — PANTOPRAZOLE SODIUM 40 MG PO TBEC: 40.0000 mg | DELAYED_RELEASE_TABLET | Freq: Two times a day (BID) | ORAL | 3 refills | Status: DC

## 2023-04-15 MED ORDER — METOPROLOL SUCCINATE ER 25 MG PO TB24: 25.0000 mg | ORAL_TABLET | Freq: Every day | ORAL | 3 refills | Status: DC

## 2023-04-15 NOTE — Telephone Encounter (Signed)
Pt c/o medication issue:  1. Name of Medication:   pantoprazole (PROTONIX) 40 MG tablet    2. How are you currently taking this medication (dosage and times per day)?   Take 1 tablet (40 mg total) by mouth 2 (two) times daily.    3. Are you having a reaction (difficulty breathing--STAT)? No  4. What is your medication issue? Pt states that insurance is needing Prior for medication before she can get refills. Pt only has 1 tablet left.    *STAT* If patient is at the pharmacy, call can be transferred to refill team.   1. Which medications need to be refilled? (please list name of each medication and dose if known)   metoprolol succinate (TOPROL-XL) 25 MG 24 hr tablet    2. Which pharmacy/location (including street and city if local pharmacy) is medication to be sent to?  [id number] - [place], [place] - [address]    3. Do they need a 30 day or 90 day supply? 90 day

## 2023-04-15 NOTE — Telephone Encounter (Signed)
Pt's medications were sent to pt's pharmacy as requested. Confirmation received.  

## 2023-05-30 ENCOUNTER — Ambulatory Visit (HOSPITAL_COMMUNITY): Payer: Federal, State, Local not specified - PPO | Attending: Internal Medicine

## 2023-05-30 DIAGNOSIS — E785 Hyperlipidemia, unspecified: Secondary | ICD-10-CM | POA: Diagnosis not present

## 2023-05-30 DIAGNOSIS — I255 Ischemic cardiomyopathy: Secondary | ICD-10-CM | POA: Insufficient documentation

## 2023-05-30 DIAGNOSIS — I2109 ST elevation (STEMI) myocardial infarction involving other coronary artery of anterior wall: Secondary | ICD-10-CM | POA: Insufficient documentation

## 2023-05-30 LAB — ECHOCARDIOGRAM COMPLETE
Area-P 1/2: 2.67 cm2
S' Lateral: 4.4 cm

## 2023-06-14 ENCOUNTER — Other Ambulatory Visit: Payer: Self-pay | Admitting: Internal Medicine

## 2023-06-24 ENCOUNTER — Telehealth: Payer: Self-pay | Admitting: Family

## 2023-06-24 DIAGNOSIS — H9312 Tinnitus, left ear: Secondary | ICD-10-CM

## 2023-06-24 MED ORDER — FLUTICASONE PROPIONATE 50 MCG/ACT NA SUSP
NASAL | 1 refills | Status: DC
Start: 2023-06-24 — End: 2023-10-24

## 2023-06-24 NOTE — Telephone Encounter (Signed)
  Prescription Request  06/24/2023  What is the name of the medication or equipment? FLONASE  Have you contacted your pharmacy to request a refill? YES  Which pharmacy would you like this sent to? [facility] [place]   Patient notified that their request is being sent to the clinical staff for review and that they should receive a response within 2 business days.

## 2023-07-04 DIAGNOSIS — M19071 Primary osteoarthritis, right ankle and foot: Secondary | ICD-10-CM | POA: Diagnosis not present

## 2023-07-04 DIAGNOSIS — M2011 Hallux valgus (acquired), right foot: Secondary | ICD-10-CM | POA: Diagnosis not present

## 2023-07-04 DIAGNOSIS — M79671 Pain in right foot: Secondary | ICD-10-CM | POA: Diagnosis not present

## 2023-07-07 DIAGNOSIS — M7751 Other enthesopathy of right foot: Secondary | ICD-10-CM | POA: Diagnosis not present

## 2023-07-07 DIAGNOSIS — M79671 Pain in right foot: Secondary | ICD-10-CM | POA: Diagnosis not present

## 2023-07-18 ENCOUNTER — Other Ambulatory Visit: Payer: Self-pay | Admitting: Internal Medicine

## 2023-07-21 ENCOUNTER — Other Ambulatory Visit: Payer: Self-pay | Admitting: Family

## 2023-07-21 DIAGNOSIS — F321 Major depressive disorder, single episode, moderate: Secondary | ICD-10-CM

## 2023-07-21 DIAGNOSIS — F411 Generalized anxiety disorder: Secondary | ICD-10-CM

## 2023-07-21 NOTE — Telephone Encounter (Signed)
Appt scheduled for 09/05/23

## 2023-07-21 NOTE — Telephone Encounter (Signed)
[place] NTBS in Sept for 6 mos FU RF sent to pharmacy

## 2023-07-28 DIAGNOSIS — M25579 Pain in unspecified ankle and joints of unspecified foot: Secondary | ICD-10-CM | POA: Diagnosis not present

## 2023-07-28 DIAGNOSIS — M79671 Pain in right foot: Secondary | ICD-10-CM | POA: Diagnosis not present

## 2023-07-28 DIAGNOSIS — M7751 Other enthesopathy of right foot: Secondary | ICD-10-CM | POA: Diagnosis not present

## 2023-08-22 ENCOUNTER — Telehealth: Payer: Federal, State, Local not specified - PPO | Admitting: Family Medicine

## 2023-08-22 NOTE — Progress Notes (Unsigned)
   Virtual Visit via MyChart video note  I connected with Sarah Fox on 08/22/23 at *** by video and verified that I am speaking with the correct person using two identifiers. Sarah Fox is currently located at *** and {family members:[phone]} are currently with her during visit. The provider, Elige Radon Maverick Dieudonne, MD is located in their office at time of visit.  Call ended at ***  I discussed the limitations, risks, security and privacy concerns of performing an evaluation and management service by video and the availability of in person appointments. I also discussed with the patient that there may be a patient responsible charge related to this service. The patient expressed understanding and agreed to proceed.   History and Present Illness:   No diagnosis found.  Outpatient Encounter Medications as of 08/22/2023  Medication Sig   atorvastatin (LIPITOR) 40 MG tablet Take 1 tablet (40 mg total) by mouth daily.   clopidogrel (PLAVIX) 75 MG tablet TAKE 1 TABLET BY MOUTH EVERY DAY   empagliflozin (JARDIANCE) 10 MG TABS tablet Take 1 tablet (10 mg total) by mouth daily before breakfast.   escitalopram (LEXAPRO) 20 MG tablet TAKE 1 TABLET BY MOUTH EVERY DAY   fluticasone (FLONASE) 50 MCG/ACT nasal spray SPRAY 2 SPRAYS INTO EACH NOSTRIL EVERY DAY   levothyroxine (SYNTHROID) 88 MCG tablet Take 1 tablet (88 mcg total) by mouth daily.   losartan (COZAAR) 25 MG tablet TAKE 1 TABLET (25 MG TOTAL) BY MOUTH DAILY.   metoprolol succinate (TOPROL-XL) 25 MG 24 hr tablet Take 1 tablet (25 mg total) by mouth daily.   nitroGLYCERIN (NITROSTAT) 0.4 MG SL tablet Place 1 tablet (0.4 mg total) under the tongue every 5 (five) minutes as needed for chest pain.   pantoprazole (PROTONIX) 40 MG tablet Take 1 tablet (40 mg total) by mouth 2 (two) times daily.   No facility-administered encounter medications on file as of 08/22/2023.    Review of Systems  Observations/Objective:   Assessment and  Plan: Problem List Items Addressed This Visit   None    Follow up plan: No follow-ups on file.     I discussed the assessment and treatment plan with the patient. The patient was provided an opportunity to ask questions and all were answered. The patient agreed with the plan and demonstrated an understanding of the instructions.   The patient was advised to call back or seek an in-person evaluation if the symptoms worsen or if the condition fails to improve as anticipated.  The above assessment and management plan was discussed with the patient. The patient verbalized understanding of and has agreed to the management plan. Patient is aware to call the clinic if symptoms persist or worsen. Patient is aware when to return to the clinic for a follow-up visit. Patient educated on when it is appropriate to go to the emergency department.    I provided *** minutes of non-face-to-face time during this encounter.    Nils Pyle, MD

## 2023-08-23 ENCOUNTER — Encounter: Payer: Self-pay | Admitting: Nurse Practitioner

## 2023-08-23 ENCOUNTER — Ambulatory Visit: Payer: Federal, State, Local not specified - PPO | Admitting: Nurse Practitioner

## 2023-08-23 VITALS — BP 128/77 | HR 66 | Temp 97.9°F | Resp 20 | Ht 65.0 in | Wt 151.0 lb

## 2023-08-23 DIAGNOSIS — J40 Bronchitis, not specified as acute or chronic: Secondary | ICD-10-CM

## 2023-08-23 MED ORDER — AZITHROMYCIN 250 MG PO TABS: ORAL_TABLET | ORAL | 0 refills | Status: DC

## 2023-08-23 MED ORDER — BENZONATATE 100 MG PO CAPS: 100.0000 mg | ORAL_CAPSULE | Freq: Two times a day (BID) | ORAL | 0 refills | Status: DC | PRN

## 2023-08-23 MED ORDER — PREDNISONE 20 MG PO TABS: 40.0000 mg | ORAL_TABLET | Freq: Every day | ORAL | 0 refills | Status: AC

## 2023-08-23 NOTE — Progress Notes (Signed)
Subjective:    Patient ID: Sarah Fox, female    DOB: 13-Aug-1966, 57 y.o.   MRN: [id number]   Chief Complaint: Cough (Negative covid test/) and Nasal Congestion   URI  This is a new problem. The current episode started in the past 7 days. The problem has been gradually improving. The maximum temperature recorded prior to her arrival was 101 - 101.9 F. The fever has been present for 1 to 2 days. Associated symptoms include congestion, coughing, rhinorrhea and a sore throat. She has tried acetaminophen for the symptoms. The treatment provided mild relief.    Patient Active Problem List   Diagnosis Date Noted   History of ST elevation myocardial infarction (STEMI) 03/11/2023   Encounter for therapeutic drug monitoring 02/19/2021   LV (left ventricular) mural thrombus 02/16/2021   Atypical angina 02/14/2021   Depression, major, single episode, moderate (HCC) 06/12/2020   Osteoarthritis 05/11/2019   GAD (generalized anxiety disorder) 10/30/2018   Hypothyroidism 08/29/2018   Chronic combined systolic and diastolic heart failure (HCC) 08/14/2018   Hyperlipidemia LDL goal <70 08/14/2018   GERD (gastroesophageal reflux disease)    Coronary artery disease    CAD in native artery    VF (ventricular fibrillation) (HCC)    Ischemic cardiomyopathy 10/27/2017   Left shoulder pain 09/29/2017   Right elbow pain 01/24/2017   Ulcer-like dyspepsia 04/04/2012   Former heavy tobacco smoker 04/04/2012       Review of Systems  HENT:  Positive for congestion, rhinorrhea and sore throat.   Respiratory:  Positive for cough.        Objective:   Physical Exam Constitutional:      Appearance: Normal appearance.  Cardiovascular:     Rate and Rhythm: Normal rate and regular rhythm.     Heart sounds: Normal heart sounds.  Pulmonary:     Effort: Pulmonary effort is normal.     Breath sounds: Normal breath sounds.     Comments: Tight cough Skin:    General: Skin is warm.  Neurological:      General: No focal deficit present.     Mental Status: She is alert and oriented to person, place, and time.  Psychiatric:        Mood and Affect: Mood normal.        Behavior: Behavior normal.     BP 128/77   Pulse 66   Temp 97.9 F (36.6 C) (Temporal)   Resp 20   Ht 5\' 5"  (1.651 m)   Wt 151 lb (68.5 kg)   SpO2 97%   BMI 25.13 kg/m        Assessment & Plan:   Sarah Fox in today with chief complaint of Cough (Negative covid test/) and Nasal Congestion   1. Bronchitis 1. Take meds as prescribed 2. Use a cool mist humidifier especially during the winter months and when heat has been humid. 3. Use saline nose sprays frequently 4. Saline irrigations of the nose can be very helpful if done frequently.  * 4X daily for 1 week*  * Use of a nettie pot can be helpful with this. Follow directions with this* 5. Drink plenty of fluids 6. Keep thermostat turn down low 7.For any cough or congestion- tessalon perls 8. For fever or aces or pains- take tylenol or ibuprofen appropriate for age and weight.  * for fevers greater than 101 orally you may alternate ibuprofen and tylenol every  3 hours.   Meds ordered this encounter  Medications   azithromycin (ZITHROMAX Z-PAK) 250 MG tablet    Sig: As directed    Dispense:  6 tablet    Refill:  0    Order Specific Question:   Supervising Provider    Answer:   Arville Care A [[id number]]   predniSONE (DELTASONE) 20 MG tablet    Sig: Take 2 tablets (40 mg total) by mouth daily with breakfast for 5 days. 2 po daily for 5 days    Dispense:  10 tablet    Refill:  0    Order Specific Question:   Supervising Provider    Answer:   Arville Care A [[id number]]   benzonatate (TESSALON) 100 MG capsule    Sig: Take 1 capsule (100 mg total) by mouth 2 (two) times daily as needed for cough.    Dispense:  20 capsule    Refill:  0    Order Specific Question:   Supervising Provider    Answer:   Arville Care A [[id number]]        The above assessment and management plan was discussed with the patient. The patient verbalized understanding of and has agreed to the management plan. Patient is aware to call the clinic if symptoms persist or worsen. Patient is aware when to return to the clinic for a follow-up visit. Patient educated on when it is appropriate to go to the emergency department.   Mary-Margaret Daphine Deutscher, FNP

## 2023-08-23 NOTE — Patient Instructions (Signed)

## 2023-09-05 ENCOUNTER — Ambulatory Visit: Payer: Federal, State, Local not specified - PPO | Admitting: Family

## 2023-09-12 ENCOUNTER — Encounter: Payer: Self-pay | Admitting: Family

## 2023-09-12 ENCOUNTER — Ambulatory Visit: Payer: Federal, State, Local not specified - PPO | Admitting: Family

## 2023-09-12 VITALS — BP 116/70 | HR 60 | Temp 97.7°F | Ht 65.0 in | Wt 151.4 lb

## 2023-09-12 DIAGNOSIS — I2510 Atherosclerotic heart disease of native coronary artery without angina pectoris: Secondary | ICD-10-CM

## 2023-09-12 DIAGNOSIS — I252 Old myocardial infarction: Secondary | ICD-10-CM

## 2023-09-12 DIAGNOSIS — I5042 Chronic combined systolic (congestive) and diastolic (congestive) heart failure: Secondary | ICD-10-CM

## 2023-09-12 DIAGNOSIS — E785 Hyperlipidemia, unspecified: Secondary | ICD-10-CM | POA: Diagnosis not present

## 2023-09-12 DIAGNOSIS — M1612 Unilateral primary osteoarthritis, left hip: Secondary | ICD-10-CM | POA: Diagnosis not present

## 2023-09-12 DIAGNOSIS — M7751 Other enthesopathy of right foot: Secondary | ICD-10-CM | POA: Diagnosis not present

## 2023-09-12 DIAGNOSIS — F411 Generalized anxiety disorder: Secondary | ICD-10-CM | POA: Diagnosis not present

## 2023-09-12 DIAGNOSIS — I2583 Coronary atherosclerosis due to lipid rich plaque: Secondary | ICD-10-CM

## 2023-09-12 DIAGNOSIS — E039 Hypothyroidism, unspecified: Secondary | ICD-10-CM | POA: Diagnosis not present

## 2023-09-12 DIAGNOSIS — K219 Gastro-esophageal reflux disease without esophagitis: Secondary | ICD-10-CM

## 2023-09-12 DIAGNOSIS — F321 Major depressive disorder, single episode, moderate: Secondary | ICD-10-CM | POA: Diagnosis not present

## 2023-09-12 DIAGNOSIS — M79671 Pain in right foot: Secondary | ICD-10-CM | POA: Diagnosis not present

## 2023-09-12 MED ORDER — ESCITALOPRAM OXALATE 20 MG PO TABS
20.0000 mg | ORAL_TABLET | Freq: Every day | ORAL | 2 refills | Status: DC
Start: 2023-09-12 — End: 2024-08-03

## 2023-09-12 NOTE — Patient Instructions (Signed)

## 2023-09-12 NOTE — Progress Notes (Signed)
Subjective:    Patient ID: Sarah Fox, female    DOB: 1965/12/31, 57 y.o.   MRN: [id number]  Chief Complaint  Patient presents with   Medical Management of Chronic Issues   PT presents to the office today for  chronic follow up. She is followed by Cardiologists annually for CAD and STEMI in 2018.    Congestive Heart Failure Presents for follow-up visit. Pertinent negatives include no edema or fatigue. The symptoms have been stable.  Gastroesophageal Reflux She complains of belching and heartburn. This is a chronic problem. The current episode started more than 1 year ago. The problem occurs rarely. The symptoms are aggravated by certain foods. Pertinent negatives include no fatigue. She has tried a PPI for the symptoms. The treatment provided moderate relief.  Thyroid Problem Presents for follow-up visit. Symptoms include diarrhea. Patient reports no anxiety, constipation, depressed mood or fatigue. The symptoms have been stable.  Arthritis Presents for follow-up visit. She complains of pain and stiffness. Affected locations include the right shoulder and left shoulder. Her pain is at a severity of 0/10 (4 on some days). Associated symptoms include diarrhea. Pertinent negatives include no fatigue.  Anxiety Presents for follow-up visit. Patient reports no depressed mood, nervous/anxious behavior or restlessness. Symptoms occur rarely. The severity of symptoms is mild.    Depression        This is a chronic problem.  The current episode started more than 1 year ago.   The problem occurs intermittently.  Associated symptoms include no fatigue, no helplessness, no hopelessness, no restlessness and not sad.  Past treatments include SSRIs - Selective serotonin reuptake inhibitors.  Past medical history includes thyroid problem and anxiety.       Review of Systems  Constitutional:  Negative for fatigue.  Gastrointestinal:  Positive for diarrhea and heartburn. Negative for constipation.   Musculoskeletal:  Positive for arthritis and stiffness.  Psychiatric/Behavioral:  Positive for depression. The patient is not nervous/anxious.   All other systems reviewed and are negative.      Objective:   Physical Exam Vitals reviewed.  Constitutional:      General: She is not in acute distress.    Appearance: She is well-developed.  HENT:     Head: Normocephalic and atraumatic.     Right Ear: Tympanic membrane normal.     Left Ear: Tympanic membrane normal.  Eyes:     Pupils: Pupils are equal, round, and reactive to light.  Neck:     Thyroid: No thyromegaly.  Cardiovascular:     Rate and Rhythm: Normal rate and regular rhythm.     Heart sounds: Normal heart sounds. No murmur heard. Pulmonary:     Effort: Pulmonary effort is normal. No respiratory distress.     Breath sounds: Normal breath sounds. No wheezing.  Abdominal:     General: Bowel sounds are normal. There is no distension.     Palpations: Abdomen is soft.     Tenderness: There is no abdominal tenderness.  Musculoskeletal:        General: No tenderness. Normal range of motion.     Cervical back: Normal range of motion and neck supple.  Skin:    General: Skin is warm and dry.  Neurological:     Mental Status: She is alert and oriented to person, place, and time.     Cranial Nerves: No cranial nerve deficit.     Deep Tendon Reflexes: Reflexes are normal and symmetric.  Psychiatric:  Behavior: Behavior normal.        Thought Content: Thought content normal.        Judgment: Judgment normal.       BP 116/70   Pulse 60   Temp 97.7 F (36.5 C) (Temporal)   Ht 5\' 5"  (1.651 m)   Wt 151 lb 6.4 oz (68.7 kg)   SpO2 97%   BMI 25.19 kg/m      Assessment & Plan:  Sarah Fox comes in today with chief complaint of Medical Management of Chronic Issues   Diagnosis and orders addressed:  1. Depression, major, single episode, moderate (HCC) - escitalopram (LEXAPRO) 20 MG tablet; Take 1 tablet (20  mg total) by mouth daily.  Dispense: 90 tablet; Refill: 2 - CMP14+EGFR  2. GAD (generalized anxiety disorder) - escitalopram (LEXAPRO) 20 MG tablet; Take 1 tablet (20 mg total) by mouth daily.  Dispense: 90 tablet; Refill: 2 - CMP14+EGFR  3. Primary osteoarthritis of left hip - CMP14+EGFR  4. Hypothyroidism, unspecified type - CMP14+EGFR - TSH  5. Hyperlipidemia LDL goal <70 - CMP14+EGFR  6. History of ST elevation myocardial infarction (STEMI) - CMP14+EGFR  7. Gastroesophageal reflux disease, unspecified whether esophagitis present - CMP14+EGFR  8. Coronary artery disease due to lipid rich plaque - CMP14+EGFR  9. Chronic combined systolic and diastolic heart failure (HCC) - CMP14+EGFR   Labs pending Health Maintenance reviewed Diet and exercise encouraged  Follow up plan: 6 months    Jannifer Rodney, FNP

## 2023-09-13 LAB — CMP14+EGFR
ALT: 21 IU/L (ref 0–32)
AST: 21 IU/L (ref 0–40)
Albumin: 4.1 g/dL (ref 3.8–4.9)
Alkaline Phosphatase: 79 IU/L (ref 44–121)
BUN/Creatinine Ratio: 13 (ref 9–23)
BUN: 11 mg/dL (ref 6–24)
Bilirubin Total: 0.3 mg/dL (ref 0.0–1.2)
CO2: 23 mmol/L (ref 20–29)
Calcium: 9.2 mg/dL (ref 8.7–10.2)
Chloride: 107 mmol/L — ABNORMAL HIGH (ref 96–106)
Creatinine, Ser: 0.82 mg/dL (ref 0.57–1.00)
Globulin, Total: 2.4 g/dL (ref 1.5–4.5)
Glucose: 111 mg/dL — ABNORMAL HIGH (ref 70–99)
Potassium: 4 mmol/L (ref 3.5–5.2)
Sodium: 142 mmol/L (ref 134–144)
Total Protein: 6.5 g/dL (ref 6.0–8.5)
eGFR: 85 mL/min/{1.73_m2} (ref >59)

## 2023-09-13 LAB — TSH: TSH: 1.9 u[IU]/mL (ref 0.450–4.500)

## 2023-09-27 ENCOUNTER — Other Ambulatory Visit: Payer: Self-pay | Admitting: Family

## 2023-09-27 DIAGNOSIS — Z1231 Encounter for screening mammogram for malignant neoplasm of breast: Secondary | ICD-10-CM

## 2023-10-18 ENCOUNTER — Ambulatory Visit
Admission: RE | Admit: 2023-10-18 | Discharge: 2023-10-18 | Disposition: A | Discharge status: Home / Self Care | Payer: Federal, State, Local not specified - PPO | Source: Ambulatory Visit | Attending: Family | Admitting: Family

## 2023-10-18 DIAGNOSIS — Z1231 Encounter for screening mammogram for malignant neoplasm of breast: Secondary | ICD-10-CM | POA: Diagnosis not present

## 2023-10-24 ENCOUNTER — Other Ambulatory Visit: Payer: Self-pay | Admitting: Family

## 2023-10-24 DIAGNOSIS — H9312 Tinnitus, left ear: Secondary | ICD-10-CM

## 2023-11-01 ENCOUNTER — Ambulatory Visit: Payer: Federal, State, Local not specified - PPO | Admitting: Family

## 2023-11-01 ENCOUNTER — Encounter: Payer: Self-pay | Admitting: Family

## 2023-11-01 VITALS — BP 104/61 | HR 63 | Temp 98.4°F | Ht 65.0 in | Wt 143.2 lb

## 2023-11-01 DIAGNOSIS — Z1211 Encounter for screening for malignant neoplasm of colon: Secondary | ICD-10-CM | POA: Diagnosis not present

## 2023-11-01 DIAGNOSIS — R197 Diarrhea, unspecified: Secondary | ICD-10-CM

## 2023-11-01 NOTE — Progress Notes (Signed)
   Subjective:    Patient ID: Sarah Fox, female    DOB: 09-17-1966, 57 y.o.   MRN: [id number]  Chief Complaint  Patient presents with   Diarrhea    Yellow mucus diarrhea    PT presents to the office today with diarrhea >10 times a day for the last 7 days. Diarrhea  This is a new problem. The current episode started 1 to 4 weeks ago. The problem occurs more than 10 times per day. The problem has been unchanged. The stool consistency is described as Mucous and watery (yellow). Associated symptoms include headaches and weight loss. Pertinent negatives include no abdominal pain, bloating, chills, coughing, fever, myalgias or vomiting. She has tried nothing for the symptoms. The treatment provided no relief.      Review of Systems  Constitutional:  Positive for weight loss. Negative for chills and fever.  Respiratory:  Negative for cough.   Gastrointestinal:  Positive for diarrhea. Negative for abdominal pain, bloating and vomiting.  Musculoskeletal:  Negative for myalgias.  Neurological:  Positive for headaches.  All other systems reviewed and are negative.      Objective:   Physical Exam Vitals reviewed.  Constitutional:      General: She is not in acute distress.    Appearance: She is well-developed.  HENT:     Head: Normocephalic and atraumatic.  Eyes:     Pupils: Pupils are equal, round, and reactive to light.  Neck:     Thyroid: No thyromegaly.  Cardiovascular:     Rate and Rhythm: Normal rate and regular rhythm.     Heart sounds: Normal heart sounds. No murmur heard. Pulmonary:     Effort: Pulmonary effort is normal. No respiratory distress.     Breath sounds: Normal breath sounds. No wheezing.  Abdominal:     General: Bowel sounds are normal. There is no distension.     Palpations: Abdomen is soft.     Tenderness: There is no abdominal tenderness.  Musculoskeletal:        General: No tenderness. Normal range of motion.     Cervical back: Normal range of  motion and neck supple.  Skin:    General: Skin is warm and dry.  Neurological:     Mental Status: She is alert and oriented to person, place, and time.     Cranial Nerves: No cranial nerve deficit.     Deep Tendon Reflexes: Reflexes are normal and symmetric.  Psychiatric:        Behavior: Behavior normal.        Thought Content: Thought content normal.        Judgment: Judgment normal.       BP 104/61   Pulse 63   Temp 98.4 F (36.9 C)   Ht 5\' 5"  (1.651 m)   Wt 143 lb 3.2 oz (65 kg)   SpO2 100%   BMI 23.83 kg/m      Assessment & Plan:  Sarah Fox comes in today with chief complaint of Diarrhea (Yellow mucus diarrhea )   Diagnosis and orders addressed:  1. Diarrhea, unspecified type Worrisome for C dif  Stool panel pending  Force fluids After getting sample can take imodium  BRAT diet - Cdiff NAA+O+P+Stool Culture - CBC with Differential/Platelet - CMP14+EGFR  2. Colon cancer screening  - Ambulatory referral to Gastroenterology   Jannifer Rodney, FNP

## 2023-11-01 NOTE — Patient Instructions (Signed)
Diarrhea, Adult Diarrhea is frequent loose and sometimes watery bowel movements. Diarrhea can make you feel weak and cause you to become dehydrated. Dehydration is a condition in which there is not enough water or other fluids in the body. Dehydration can make you tired and thirsty, cause you to have a dry mouth, and decrease how often you urinate. Diarrhea typically lasts 2-3 days. However, it can last longer if it is a sign of something more serious. It is important to treat your diarrhea as told by your health care provider. Follow these instructions at home: Eating and drinking     Follow these recommendations as told by your health care provider: Take an oral rehydration solution (ORS). This is an over-the-counter medicine that helps return your body to its normal balance of nutrients and water. It is found at pharmacies and retail stores. Drink enough fluid to keep your urine pale yellow. Drink fluids such as water, diluted fruit juice, and low-calorie sports drinks. You can drink milk also, if desired. Sucking on ice chips is another way to get fluids. Avoid drinking fluids that contain a lot of sugar or caffeine, such as soda, energy drinks, and regular sports drinks. Avoid alcohol. Eat bland, easy-to-digest foods in small amounts as you are able. These foods include bananas, applesauce, rice, lean meats, toast, and crackers. Avoid spicy or fatty foods.  Medicines Take over-the-counter and prescription medicines only as told by your health care provider. If you were prescribed antibiotics, take them as told by your health care provider. Do not stop using the antibiotic even if you start to feel better. General instructions  Wash your hands often using soap and water for at least 20 seconds. If soap and water are not available, use hand sanitizer. Others in the household should wash their hands as well. Hands should be washed: After using the toilet or changing a diaper. Before  preparing, cooking, or serving food. While caring for a sick person or while visiting someone in a hospital. Rest at home while you recover. Take a warm bath to relieve any burning or pain from frequent diarrhea episodes. Watch your condition for any changes. Contact a health care provider if: You have a fever. Your diarrhea gets worse. You have new symptoms. You vomit every time you eat or drink. You feel light-headed, dizzy, or have a headache. You have muscle cramps. You have signs of dehydration, such as: Dark urine, very little urine, or no urine. Cracked lips. Dry mouth. Sunken eyes. Sleepiness. Weakness. You have bloody or black stools or stools that look like tar. You have severe pain, cramping, or bloating in your abdomen. Your skin feels cold and clammy. You feel confused. Get help right away if: You have chest pain or your heart is beating very quickly. You have trouble breathing or you are breathing very quickly. You feel extremely weak or you faint. These symptoms may be an emergency. Get help right away. Call 911. Do not wait to see if the symptoms will go away. Do not drive yourself to the hospital. This information is not intended to replace advice given to you by your health care provider. Make sure you discuss any questions you have with your health care provider. Document Revised: 06/01/2022 Document Reviewed: 06/01/2022 Elsevier Patient Education  2024 [employer].

## 2023-11-02 LAB — CBC WITH DIFFERENTIAL/PLATELET
Basophils Absolute: 0.1 10*3/uL (ref 0.0–0.2)
Basos: 1 %
EOS (ABSOLUTE): 0.9 10*3/uL — ABNORMAL HIGH (ref 0.0–0.4)
Eos: 13 %
Hematocrit: 43.6 % (ref 34.0–46.6)
Hemoglobin: 14.4 g/dL (ref 11.1–15.9)
Immature Grans (Abs): 0 10*3/uL (ref 0.0–0.1)
Immature Granulocytes: 0 %
Lymphocytes Absolute: 2.5 10*3/uL (ref 0.7–3.1)
Lymphs: 35 %
MCH: 31.2 pg (ref 26.6–33.0)
MCHC: 33 g/dL (ref 31.5–35.7)
MCV: 95 fL (ref 79–97)
Monocytes Absolute: 0.5 10*3/uL (ref 0.1–0.9)
Monocytes: 6 %
Neutrophils Absolute: 3.2 10*3/uL (ref 1.4–7.0)
Neutrophils: 45 %
Platelets: 298 10*3/uL (ref 150–450)
RBC: 4.61 x10E6/uL (ref 3.77–5.28)
RDW: 13.4 % (ref 11.7–15.4)
WBC: 7.1 10*3/uL (ref 3.4–10.8)

## 2023-11-02 LAB — CMP14+EGFR
ALT: 29 IU/L (ref 0–32)
AST: 24 IU/L (ref 0–40)
Albumin: 4.8 g/dL (ref 3.8–4.9)
Alkaline Phosphatase: 80 [IU]/L (ref 44–121)
BUN/Creatinine Ratio: 12 (ref 9–23)
BUN: 10 mg/dL (ref 6–24)
Bilirubin Total: 0.4 mg/dL (ref 0.0–1.2)
CO2: 19 mmol/L — ABNORMAL LOW (ref 20–29)
Calcium: 9.9 mg/dL (ref 8.7–10.2)
Chloride: 107 mmol/L — ABNORMAL HIGH (ref 96–106)
Creatinine, Ser: 0.85 mg/dL (ref 0.57–1.00)
Globulin, Total: 2.5 g/dL (ref 1.5–4.5)
Glucose: 109 mg/dL — ABNORMAL HIGH (ref 70–99)
Potassium: 4.3 mmol/L (ref 3.5–5.2)
Sodium: 143 mmol/L (ref 134–144)
Total Protein: 7.3 g/dL (ref 6.0–8.5)
eGFR: 81 mL/min/{1.73_m2} (ref >59)

## 2023-11-07 LAB — CDIFF NAA+O+P+STOOL CULTURE
E coli, Shiga toxin Assay: NEGATIVE
Toxigenic C. Difficile by PCR: NEGATIVE

## 2023-11-15 ENCOUNTER — Encounter: Payer: Self-pay | Admitting: Internal Medicine

## 2023-11-21 ENCOUNTER — Encounter: Payer: Self-pay | Admitting: Internal Medicine

## 2023-12-11 NOTE — Progress Notes (Unsigned)
GI Office Note    Referring Provider: Junie Spencer, FNP Primary Care Physician:  Junie Spencer, FNP  Primary Gastroenterologist: Hennie Duos. Marletta Lor, DO  Chief Complaint   No chief complaint on file.  History of Present Illness   Sarah Fox is a 57 y.o. female presenting today at the request of Junie Spencer, FNP for evaluation prior to colonoscopy.   EGD 2013 Dr. Rhea Belton: -irregular Z line GE junction s/p biopsies -normal esophagus -normal stomach -normal duodenum -PPI once daily  Last seen by [facility] in May 2022. Ain radiating to left shoulder  and into her back and ribs. This occurred when she had a ventricular mural thrombus and was hospitalized. Cardiology advised her to see GI regarding her pains. Increasing PPI to BID did not help. Described daily regurg of food and acid. Noted increased choking episodes. Lost of taste since August 2021. Denied history of COVID. Advised EGD but needed to hold given recent induction of coumadin for thrombus. Scheduled for BPE first. Continue PPI BID. Added pepcid 20 mg BID.   BPE May 2022: -tiny hiatal hernia -barium tablet passed easily -mild GE reflux  Today:    Wt Readings from Last 3 Encounters:  11/01/23 143 lb 3.2 oz (65 kg)  09/12/23 151 lb 6.4 oz (68.7 kg)  08/23/23 151 lb (68.5 kg)    Current Outpatient Medications  Medication Sig Dispense Refill   atorvastatin (LIPITOR) 40 MG tablet Take 1 tablet (40 mg total) by mouth daily. 90 tablet 3   clopidogrel (PLAVIX) 75 MG tablet TAKE 1 TABLET BY MOUTH EVERY DAY 90 tablet 2   empagliflozin (JARDIANCE) 10 MG TABS tablet Take 1 tablet (10 mg total) by mouth daily before breakfast. 90 tablet 3   escitalopram (LEXAPRO) 20 MG tablet Take 1 tablet (20 mg total) by mouth daily. 90 tablet 2   fluticasone (FLONASE) 50 MCG/ACT nasal spray SPRAY 2 SPRAYS INTO EACH NOSTRIL EVERY DAY 48 mL 1   levothyroxine (SYNTHROID) 88 MCG tablet Take 1 tablet (88 mcg total) by mouth  daily. 90 tablet 3   losartan (COZAAR) 25 MG tablet TAKE 1 TABLET (25 MG TOTAL) BY MOUTH DAILY. 90 tablet 1   metoprolol succinate (TOPROL-XL) 25 MG 24 hr tablet Take 1 tablet (25 mg total) by mouth daily. 90 tablet 3   nitroGLYCERIN (NITROSTAT) 0.4 MG SL tablet Place 1 tablet (0.4 mg total) under the tongue every 5 (five) minutes as needed for chest pain. 25 tablet 3   pantoprazole (PROTONIX) 40 MG tablet Take 1 tablet (40 mg total) by mouth 2 (two) times daily. 180 tablet 3   No current facility-administered medications for this visit.    Past Medical History:  Diagnosis Date   Acute ST elevation myocardial infarction (STEMI) of anterior wall (HCC)    Allergy    CAD in native artery    Cardiac arrest with ventricular fibrillation (HCC) 10/2017   with acute MI and hypokalemia   Coronary artery disease    GERD (gastroesophageal reflux disease)    Ischemic cardiomyopathy 10/2017   Myocardial infarction acute (HCC) 10/2017   Thyroid disease    hypo   VF (ventricular fibrillation) (HCC)     Past Surgical History:  Procedure Laterality Date   CORONARY STENT INTERVENTION N/A 11/29/2017   Procedure: CORONARY STENT INTERVENTION;  Surgeon: Lyn Records, MD;  Location: [facility] INVASIVE CV LAB;  Service: Cardiovascular;  Laterality: N/A;   CORONARY/GRAFT ACUTE MI REVASCULARIZATION N/A 11/29/2017  Procedure: Coronary/Graft Acute MI Revascularization;  Surgeon: Lyn Records, MD;  Location: [facility] INVASIVE CV LAB;  Service: Cardiovascular;  Laterality: N/A;   LEFT HEART CATH AND CORONARY ANGIOGRAPHY N/A 11/29/2017   Procedure: LEFT HEART CATH AND CORONARY ANGIOGRAPHY;  Surgeon: Lyn Records, MD;  Location: [facility] INVASIVE CV LAB;  Service: Cardiovascular;  Laterality: N/A;   LUMBAR DISC SURGERY     L5-S1    Family History  Problem Relation Age of Onset   Breast cancer Mother 69   Other Father    Breast cancer Sister 46   Breast cancer Cousin 38 - 45       60s was a 2nd ocurrence   Heart disease  Brother    Breast cancer Other    Breast cancer Other    Colon cancer Neg Hx    Esophageal cancer Neg Hx    Rectal cancer Neg Hx    Stomach cancer Neg Hx     Allergies as of 12/12/2023 - Review Complete 11/01/2023  Allergen Reaction Noted   Sulfa antibiotics Rash 05/09/2020    Social History   Socioeconomic History   Marital status: Married    Spouse name: Not on file   Number of children: 1   Years of education: Not on file   Highest education level: Not on file  Occupational History   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[employer]  Tobacco Use   Smoking status: Former    Current packs/day: 0.00    Average packs/day: 0.5 packs/day for 20.0 years (10.0 ttl pk-yrs)    Types: Cigarettes    Start date: 11/28/1997    Quit date: 11/28/2017    Years since quitting: 6.0   Smokeless tobacco: Never  Vaping Use   Vaping status: Never Used  Substance and Sexual Activity   Alcohol use: Yes    Alcohol/week: 28.0 standard drinks of alcohol    Types: 28 Cans of beer per week    Comment: 4 16 oz beers nightly last drink last night 02/13/21   Drug use: No   Sexual activity: Yes    Comment: ptp states she cannot be pregnant, but not using anything to not get prenant  Other Topics Concern   Not on file  Social History Narrative   Married with one child.   Social Drivers of Corporate investment banker Strain: Not on file  Food Insecurity: Not on file  Transportation Needs: Not on file  Physical Activity: Not on file  Stress: Stress Concern Present (11/29/2017)   [employer] of Occupational Health - Occupational Stress Questionnaire    Feeling of Stress : Very much  Social Connections: Not on file  Intimate Partner Violence: Not on file     Review of Systems   Gen: Denies any fever, chills, fatigue, weight loss, lack of appetite.  CV: Denies chest pain, heart palpitations, peripheral edema, syncope.  Resp: Denies shortness of breath at rest or with exertion.  Denies wheezing or cough.  GI: see HPI GU : Denies urinary burning, urinary frequency, urinary hesitancy MS: Denies joint pain, muscle weakness, cramps, or limitation of movement.  Derm: Denies rash, itching, dry skin Psych: Denies depression, anxiety, memory loss, and confusion Heme: Denies bruising, bleeding, and enlarged lymph nodes.  Physical Exam   There were no vitals taken for this visit.  General:   Alert and oriented. Pleasant and cooperative. Well-nourished and well-developed.  Head:  Normocephalic and atraumatic. Eyes:  Without icterus, sclera  clear and conjunctiva pink.  Ears:  Normal auditory acuity. Mouth:  No deformity or lesions, oral mucosa pink.  Lungs:  Clear to auscultation bilaterally. No wheezes, rales, or rhonchi. No distress.  Heart:  S1, S2 present without murmurs appreciated.  Abdomen:  +BS, soft, non-tender and non-distended. No HSM noted. No guarding or rebound. No masses appreciated.  Rectal:  Deferred  Msk:  Symmetrical without gross deformities. Normal posture. Extremities:  Without edema. Neurologic:  Alert and  oriented x4;  grossly normal neurologically. Skin:  Intact without significant lesions or rashes. Psych:  Alert and cooperative. Normal mood and affect.  Assessment   Sarah Fox is a 57 y.o. female with a history of left ventricular thrombus, CAD, MI, ischemic cardiomyopathy, and GERD presenting today for evaluation prior to scheduling colonoscopy.   Screening colon cancer:   PLAN   *** Proceed with colonoscopy with propofol by Dr. Marletta Lor  in near future: the risks, benefits, and alternatives have been discussed with the patient in detail. The patient states understanding and desires to proceed. ASA 3 Cardiac clearance to hold Plavix for 5 days Hold Jardiance for 3 days prior     Brooke Bonito, MSN, FNP-BC, AGACNP-BC [facility]

## 2023-12-12 ENCOUNTER — Ambulatory Visit: Payer: Federal, State, Local not specified - PPO | Admitting: Gastroenterology

## 2023-12-20 NOTE — Progress Notes (Unsigned)
Referring Provider:Hawks, Edilia Bo, FNP  Primary Care Physician:  Junie Spencer, FNP Primary Gastroenterologist:  Dr. Marletta Lor  Chief Complaint  Patient presents with   Colonoscopy    Colonoscopy screening     HPI:   Sarah Fox is a 57 y.o. female presenting today at the request of Junie Spencer, FNP for colon cancer screening.  Today, patient reports she has had watery stools for years.  Has intermittent flares that can occur every few weeks with 10 or 12 bowel movements per day.  During these flares, she will have a bowel movement after she eats or drinks anything.  Most recent flare was in October when she had significant diarrhea for 2 weeks and lost 12 or 13 pounds.  Denies any associated abdominal pain, nausea, vomiting, BRBPR, melena.  Reports the symptoms have been going on for years with no recent change.  No recent antibiotics, sick contacts, travel.  Recent stool testing with PCP in November was negative for C. difficile, Salmonella/Shigella, Campylobacter, E. coli, O&P.  Reports she is currently having bristol 6 Bms with 4-5 Bms per day which is her baseline.  No brbpr or melena. Stools can be yellow or brown. Has significant bowel urgency. 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  No Fhx of colon cancer or IBD.    Has chronic GERD that is well controlled on pantoprazole 40 mg daily.  Rare flares.   Every now and then will take naproxen.  No routine NSAID use.   Last colonoscopy: Never   Last EGD 04/17/2012: Irregular Z-line biopsy, normal stomach biopsy, normal duodenum.  Pathology with minimal chronic gastritis negative for H. pylori, chronically inflamed GE junction mucosa.  This could have been related to reflux versus EOE as in some foci, the number of intraepithelial eosinophils exceeded 20 per hpf.   Past Medical History:  Diagnosis Date   Acute ST elevation myocardial infarction (STEMI)  of anterior wall (HCC)    Allergy    CAD in native artery    Cardiac arrest with ventricular fibrillation (HCC) 10/2017   with acute MI and hypokalemia   Coronary artery disease    GERD (gastroesophageal reflux disease)    Ischemic cardiomyopathy 10/2017   Myocardial infarction acute (HCC) 10/2017   Thyroid disease    hypo   VF (ventricular fibrillation) (HCC)     Past Surgical History:  Procedure Laterality Date   CORONARY STENT INTERVENTION N/A 11/29/2017   Procedure: CORONARY STENT INTERVENTION;  Surgeon: Lyn Records, MD;  Location: [facility];  Service: Cardiovascular;  Laterality: N/A;   CORONARY/GRAFT ACUTE MI REVASCULARIZATION N/A 11/29/2017   Procedure: Coronary/Graft Acute MI Revascularization;  Surgeon: Lyn Records, MD;  Location: [facility];  Service: Cardiovascular;  Laterality: N/A;   LEFT HEART CATH AND CORONARY ANGIOGRAPHY N/A 11/29/2017   Procedure: LEFT HEART CATH AND CORONARY ANGIOGRAPHY;  Surgeon: Lyn Records, MD;  Location: [facility]VAS[facility];  Service: Cardiovascular;  Laterality: N/A;   LUMBAR DISC SURGERY     L5-S1    Current Outpatient Medications  Medication Sig Dispense Refill   atorvastatin (LIPITOR) 40 MG tablet Take 1 tablet (40 mg total) by mouth daily. 90 tablet 3   clopidogrel (PLAVIX) 75 MG tablet TAKE 1 TABLET BY MOUTH EVERY DAY 90 tablet 2   empagliflozin (JARDIANCE) 10 MG TABS tablet Take 1 tablet (10 mg total) by  mouth daily before breakfast. 90 tablet 3   escitalopram (LEXAPRO) 20 MG tablet Take 1 tablet (20 mg total) by mouth daily. 90 tablet 2   fluticasone (FLONASE) 50 MCG/ACT nasal spray SPRAY 2 SPRAYS INTO EACH NOSTRIL EVERY DAY 48 mL 1   levothyroxine (SYNTHROID) 88 MCG tablet Take 1 tablet (88 mcg total) by mouth daily. 90 tablet 3   losartan (COZAAR) 25 MG tablet TAKE 1 TABLET (25 MG TOTAL) BY MOUTH DAILY. 90 tablet 1   metoprolol succinate (TOPROL-XL) 25 MG 24 hr tablet Take 1 tablet (25 mg total) by mouth daily.  90 tablet 3   nitroGLYCERIN (NITROSTAT) 0.4 MG SL tablet Place 1 tablet (0.4 mg total) under the tongue every 5 (five) minutes as needed for chest pain. 25 tablet 3   pantoprazole (PROTONIX) 40 MG tablet Take 1 tablet (40 mg total) by mouth 2 (two) times daily. 180 tablet 3   No current facility-administered medications for this visit.    Allergies as of 12/22/2023 - Review Complete 12/22/2023  Allergen Reaction Noted   Sulfa antibiotics Rash 05/09/2020    Family History  Problem Relation Age of Onset   Breast cancer Mother 45   Other Father    Breast cancer Sister 47   Breast cancer Cousin 39 - 57       60s was a 2nd ocurrence   Heart disease Brother    Breast cancer Other    Breast cancer Other    Colon cancer Neg Hx    Esophageal cancer Neg Hx    Rectal cancer Neg Hx    Stomach cancer Neg Hx     Social History   Socioeconomic History   Marital status: Married    Spouse name: Not on file   Number of children: 1   Years of education: Not on file   Highest education level: Not on file  Occupational History   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[employer]  Tobacco Use   Smoking status: Former    Current packs/day: 0.00    Average packs/day: 0.5 packs/day for 20.0 years (10.0 ttl pk-yrs)    Types: Cigarettes    Start date: 11/28/1997    Quit date: 11/28/2017    Years since quitting: 6.0   Smokeless tobacco: Never  Vaping Use   Vaping status: Never Used  Substance and Sexual Activity   Alcohol use: Yes    Alcohol/week: 28.0 standard drinks of alcohol    Types: 28 Cans of beer per week    Comment: 4 16 oz beers nightly   Drug use: No   Sexual activity: Yes    Comment: ptp states she cannot be pregnant, but not using anything to not get prenant  Other Topics Concern   Not on file  Social History Narrative   Married with one child.   Social Drivers of Corporate investment banker Strain: Not on file  Food Insecurity: Not on file  Transportation Needs:  Not on file  Physical Activity: Not on file  Stress: Stress Concern Present (11/29/2017)   [employer] of Occupational Health - Occupational Stress Questionnaire    Feeling of Stress : Very much  Social Connections: Not on file  Intimate Partner Violence: Not on file    Review of Systems: Gen: Denies any fever, chills, cold or flulike symptoms, presyncope, syncope. CV: Denies chest pain, heart palpitations. Resp: Denies shortness of breath, cough. GI: See HPI GU : Denies urinary burning, urinary frequency,  urinary hesitancy MS: Denies joint pain, muscle weakness, cramps, or limitation of movement.  Derm: Denies rash. Psych: Denies depression, anxiety. Heme: Denies bruising, bleeding.   Physical Exam: BP 122/73 (BP Location: Right Arm, Patient Position: Sitting, Cuff Size: Normal)   Pulse 67   Temp 98 F (36.7 C) (Temporal)   Wt 147 lb 9.6 oz (67 kg)   LMP  (Approximate)   BMI 24.56 kg/m  General:   Alert and oriented. Pleasant and cooperative. Well-nourished and well-developed.  Head:  Normocephalic and atraumatic. Eyes:  Without icterus, sclera clear and conjunctiva pink.  Ears:  Normal auditory acuity. Lungs:  Clear to auscultation bilaterally. No wheezes, rales, or rhonchi. No distress.  Heart:  S1, S2 present without murmurs appreciated.  Abdomen:  +BS, soft, non-tender and non-distended. No HSM noted. No guarding or rebound. No masses appreciated.  Rectal:  Deferred  Msk:  Symmetrical without gross deformities. Normal posture. Extremities:  Without edema. Neurologic:  Alert and  oriented x4;  grossly normal neurologically. Skin:  Intact without significant lesions or rashes. Psych: Normal mood and affect.    Assessment:  57 year old female with history of CAD, MI, ischemic cardiomyopathy, hypothyroidism, HTN, HLD, GERD, chronic diarrhea, presenting today at the request of Junie Spencer, FNP for colon cancer screening.   Colon cancer screening: Overdue.   No prior colonoscopy.  No BRBPR, melena.  She does have chronic diarrhea as discussed below and has had some weight loss recently related to this.  Denies family history of colon cancer, IBD.  Chronic diarrhea: Present for years.  Baseline is [place] 6 with 4-5 BMs per day, but intermittent flares with up to 10-12 bowel movements per day.  Denies BRBPR, melena, abdominal pain.  Reports she recently lost 12 or 13 pounds over a 2-week period in October related to diarrhea flare.  No known family history of colon cancer or IBD.  Recent stool testing with PCP in November was negative for C. difficile, Salmonella/Shigella, Campylobacter, E. coli, O&P.  Etiology unclear.  Will have patient complete more comprehensive GI profile panel.  Will also check thyroid function, screen for celiac disease, check inflammatory markers for possible IBD.  Patient will be having a colonoscopy in the near future for colon cancer screening.  This will also serve to further evaluate her diarrhea.  Would recommend obtaining colon biopsies to rule out microscopic colitis if no apparent inflammation is seen.    Plan:  Proceed with colonoscopy with propofol by Dr. Marletta Lor in near future. The risks, benefits, and alternatives have been discussed with the patient in detail. The patient states understanding and desires to proceed.  ASA 3 We will get the okay to hold Plavix for 5 days prior to colonoscopy. Hold Jardiance x 3 days prior to colonoscopy GI profile panel, TSH, IgA, TTG IgA, CRP, sed rate, fecal calprotectin. Follow-up after colonoscopy.   Ermalinda Memos, PA-C [facility] 12/22/2023

## 2023-12-22 ENCOUNTER — Encounter: Payer: Self-pay | Admitting: Gastroenterology

## 2023-12-22 ENCOUNTER — Ambulatory Visit: Payer: Federal, State, Local not specified - PPO | Admitting: Gastroenterology

## 2023-12-22 ENCOUNTER — Telehealth: Payer: Self-pay | Admitting: *Deleted

## 2023-12-22 VITALS — BP 122/73 | HR 67 | Temp 98.0°F | Wt 147.6 lb

## 2023-12-22 DIAGNOSIS — Z1211 Encounter for screening for malignant neoplasm of colon: Secondary | ICD-10-CM

## 2023-12-22 DIAGNOSIS — R197 Diarrhea, unspecified: Secondary | ICD-10-CM | POA: Insufficient documentation

## 2023-12-22 DIAGNOSIS — K529 Noninfective gastroenteritis and colitis, unspecified: Secondary | ICD-10-CM | POA: Diagnosis not present

## 2023-12-22 NOTE — Telephone Encounter (Signed)
   Patient Name: Sarah Fox  DOB: 05-01-1966 MRN: [id number]  Primary Cardiologist: Dr. Alverda Skeans  Procedure: Colonoscopy, date TBD  Patient's past medical history, labs, and current medications have been reviewed as part of preoperative protocol coverage. The following recommendations have been made: Patient can hold Plavix for 5 days prior to procedure, please resume when safe to do so from a bleeding standpoint. Ideally aspirin 81 mg daily would be started in place of Plavix during perioperative period, unless surgeon feels bleeding risk is to high. Aspirin can be stopped once Plavix is resumed.   I will route this recommendation to the requesting party via Epic fax function and remove from pre-op pool.  Please call with questions.  Rip Harbour, NP 12/22/2023, 11:24 AM

## 2023-12-22 NOTE — Telephone Encounter (Signed)
  Request for patient to stop medication prior to procedure or is needing cleareance  12/22/23  Sarah Fox 1966/08/06  What type of surgery is being performed? Colonoscopy  When is surgery scheduled? TBD  What type of clearance is required (medical or pharmacy to hold medication or both? medication  Are there any medications that need to be held prior to surgery and how long? Plavix x 5 days  Name of physician performing surgery?  Dr.Carver [facility] at [employer]: [phone] Fax: [phone]  Anethesia type (none, local, MAC, general)? MAC

## 2023-12-22 NOTE — Patient Instructions (Addendum)
Please have blood work/stool testing completed at [facility]. [address] [place], [place] [address] [place] [address] [place], [place] [address]  We will get you scheduled for colonoscopy in the near future with Dr. Marletta Lor. We will reach out to your cardiologist to get approval to hold Plavix for 5 days prior to your procedure. You will need to hold Jardiance for 3 days prior to your procedure.  I will see you back in the office after your colonoscopy.  Ermalinda Memos, PA-C [facility]

## 2023-12-26 NOTE — Telephone Encounter (Signed)
Reviewed.  Patient may hold Plavix for 5 days prior to her procedures.  She should take 81 mg aspirin daily while she is not taking Plavix per cardiology's recommendations.   Please proceed with scheduling with the above medication changes perioperatively.

## 2023-12-26 NOTE — Telephone Encounter (Signed)
Please advise. Thank you

## 2023-12-29 NOTE — Telephone Encounter (Signed)
 LMTRC

## 2024-01-04 ENCOUNTER — Other Ambulatory Visit: Payer: Self-pay | Admitting: *Deleted

## 2024-01-04 ENCOUNTER — Encounter: Payer: Self-pay | Admitting: *Deleted

## 2024-01-04 MED ORDER — PEG 3350-KCL-NA BICARB-NACL 420 G PO SOLR: 4000.0000 mL | Freq: Once | ORAL | 0 refills | Status: AC

## 2024-01-04 NOTE — Telephone Encounter (Signed)
 Pt has been scheduled for 02/06/24. Instructions mailed and prep sent to the pharmacy.

## 2024-01-05 ENCOUNTER — Encounter: Payer: Self-pay | Admitting: *Deleted

## 2024-02-02 ENCOUNTER — Encounter (HOSPITAL_COMMUNITY)
Admission: RE | Admit: 2024-02-02 | Discharge: 2024-02-02 | Disposition: A | Discharge status: Home / Self Care | Payer: Federal, State, Local not specified - PPO | Source: Ambulatory Visit | Attending: Internal Medicine | Admitting: Internal Medicine

## 2024-02-06 ENCOUNTER — Ambulatory Visit (HOSPITAL_COMMUNITY)
Admission: RE | Admit: 2024-02-06 | Discharge: 2024-02-06 | Disposition: A | Discharge status: Home / Self Care | Payer: Federal, State, Local not specified - PPO | Attending: Internal Medicine | Admitting: Internal Medicine

## 2024-02-06 ENCOUNTER — Encounter (HOSPITAL_COMMUNITY)
Admission: RE | Disposition: A | Discharge status: Home / Self Care | Payer: Self-pay | Source: Home / Self Care | Attending: Internal Medicine

## 2024-02-06 ENCOUNTER — Ambulatory Visit (HOSPITAL_COMMUNITY): Payer: Federal, State, Local not specified - PPO | Admitting: Anesthesiology

## 2024-02-06 ENCOUNTER — Encounter (HOSPITAL_COMMUNITY): Payer: Self-pay | Admitting: Internal Medicine

## 2024-02-06 DIAGNOSIS — I252 Old myocardial infarction: Secondary | ICD-10-CM | POA: Diagnosis not present

## 2024-02-06 DIAGNOSIS — K648 Other hemorrhoids: Secondary | ICD-10-CM | POA: Insufficient documentation

## 2024-02-06 DIAGNOSIS — I2510 Atherosclerotic heart disease of native coronary artery without angina pectoris: Secondary | ICD-10-CM | POA: Diagnosis not present

## 2024-02-06 DIAGNOSIS — Z79890 Hormone replacement therapy (postmenopausal): Secondary | ICD-10-CM | POA: Insufficient documentation

## 2024-02-06 DIAGNOSIS — E039 Hypothyroidism, unspecified: Secondary | ICD-10-CM | POA: Diagnosis not present

## 2024-02-06 DIAGNOSIS — D123 Benign neoplasm of transverse colon: Secondary | ICD-10-CM | POA: Diagnosis not present

## 2024-02-06 DIAGNOSIS — D125 Benign neoplasm of sigmoid colon: Secondary | ICD-10-CM | POA: Insufficient documentation

## 2024-02-06 DIAGNOSIS — Z1211 Encounter for screening for malignant neoplasm of colon: Secondary | ICD-10-CM | POA: Insufficient documentation

## 2024-02-06 DIAGNOSIS — Z139 Encounter for screening, unspecified: Secondary | ICD-10-CM | POA: Diagnosis not present

## 2024-02-06 DIAGNOSIS — K635 Polyp of colon: Secondary | ICD-10-CM | POA: Diagnosis not present

## 2024-02-06 DIAGNOSIS — Z87891 Personal history of nicotine dependence: Secondary | ICD-10-CM | POA: Diagnosis not present

## 2024-02-06 DIAGNOSIS — I25118 Atherosclerotic heart disease of native coronary artery with other forms of angina pectoris: Secondary | ICD-10-CM | POA: Diagnosis not present

## 2024-02-06 HISTORY — PX: COLONOSCOPY WITH PROPOFOL: SHX5780

## 2024-02-06 HISTORY — PX: POLYPECTOMY: SHX5525

## 2024-02-06 HISTORY — PX: BIOPSY: SHX5522

## 2024-02-06 LAB — GLUCOSE, CAPILLARY: Glucose-Capillary: 77 mg/dL (ref 70–99)

## 2024-02-06 SURGERY — COLONOSCOPY WITH PROPOFOL
Anesthesia: General

## 2024-02-06 MED ORDER — PROPOFOL 10 MG/ML IV BOLUS
INTRAVENOUS | Status: DC | PRN
  Administered 2024-02-06: 100 mg via INTRAVENOUS

## 2024-02-06 MED ORDER — PROPOFOL 500 MG/50ML IV EMUL
INTRAVENOUS | Status: DC | PRN
  Administered 2024-02-06: 200 ug/kg/min via INTRAVENOUS

## 2024-02-06 MED ORDER — LACTATED RINGERS IV SOLN: INTRAVENOUS | Status: DC | PRN

## 2024-02-06 MED ORDER — STERILE WATER FOR IRRIGATION IR SOLN
Status: DC | PRN
  Administered 2024-02-06: 60 mL

## 2024-02-06 MED ORDER — LIDOCAINE HCL (CARDIAC) PF 100 MG/5ML IV SOSY
PREFILLED_SYRINGE | INTRAVENOUS | Status: DC | PRN
  Administered 2024-02-06: 50 mg via INTRAVENOUS

## 2024-02-06 NOTE — Anesthesia Postprocedure Evaluation (Signed)
 Anesthesia Post Note  Patient: Sarah Fox  Procedure(s) Performed: COLONOSCOPY WITH PROPOFOL  BIOPSY POLYPECTOMY  Patient location during evaluation: Short Stay Anesthesia Type: General Level of consciousness: awake and alert Pain management: pain level controlled Vital Signs Assessment: post-procedure vital signs reviewed and stable Respiratory status: spontaneous breathing Cardiovascular status: blood pressure returned to baseline and stable Postop Assessment: no apparent nausea or vomiting Anesthetic complications: no   No notable events documented.   Last Vitals:  Vitals:   02/06/24 1006 02/06/24 1130  BP: (!) 140/71 112/66  Pulse: (!) 58 61  Resp: 14 17  Temp: 36.6 C (!) 36.4 C  SpO2: 100% 99%    Last Pain:  Vitals:   02/06/24 1130  TempSrc: Oral  PainSc: 0-No pain                 Reda Citron

## 2024-02-06 NOTE — Op Note (Signed)
 [facility] Patient Name: Sarah Fox Procedure Date: 02/06/2024 10:39 AM MRN: [id number] Date of Birth: April 04, 1966 Attending MD: Rolando Cliche. Mordechai April , [place], [id number] CSN: [id number] Age: 58 Admit Type: Outpatient Procedure:                Colonoscopy Indications:              Screening for colorectal malignant neoplasm Providers:                Rolando Cliche. Mordechai April, DO, Vonna Guardian, Theola Fitch Referring MD:             Rolando Cliche. Mordechai April, DO Medicines:                See the Anesthesia note for documentation of the                            administered medications Complications:            No immediate complications. Estimated Blood Loss:     Estimated blood loss was minimal. Procedure:                Pre-Anesthesia Assessment:                           - The anesthesia plan was to use monitored                            anesthesia care (MAC).                           After obtaining informed consent, the colonoscope                            was passed under direct vision. Throughout the                            procedure, the patient's blood pressure, pulse, and                            oxygen saturations were monitored continuously. The                            [id number]-HQ190L ([id number]) scope was introduced through                            the anus and advanced to the the terminal ileum,                            with identification of the appendiceal orifice and                            IC valve. The colonoscopy was performed without                            difficulty. The patient tolerated the procedure  well. The quality of the bowel preparation was                            evaluated using the BBPS [facility] Bowel Preparation                            Scale) with scores of: Right Colon = 3, Transverse                            Colon = 3 and Left Colon = 3 (entire mucosa seen                            well with no residual staining,  small fragments of                            stool or opaque liquid). The total BBPS score                            equals 9. Scope In: 11:11:05 AM Scope Out: 11:27:18 AM Scope Withdrawal Time: 0 hours 12 minutes 5 seconds  Total Procedure Duration: 0 hours 16 minutes 13 seconds  Findings:      Non-bleeding internal hemorrhoids were found.      A 7 mm polyp was found in the transverse colon. The polyp was sessile.       The polyp was removed with a cold snare. Resection and retrieval were       complete.      A 9 mm polyp was found in the sigmoid colon. The polyp was sessile. The       polyp was removed with a cold snare. Resection and retrieval were       complete.      Biopsies for histology were taken with a cold forceps from the ascending       colon, transverse colon and descending colon for evaluation of       microscopic colitis.      The exam was otherwise without abnormality. Impression:               - Non-bleeding internal hemorrhoids.                           - One 7 mm polyp in the transverse colon, removed                            with a cold snare. Resected and retrieved.                           - One 9 mm polyp in the sigmoid colon, removed with                            a cold snare. Resected and retrieved.                           - The examination was otherwise normal.                           -  Biopsies were taken with a cold forceps from the                            ascending colon, transverse colon and descending                            colon for evaluation of microscopic colitis. Moderate Sedation:      Per Anesthesia Care Recommendation:           - Patient has a contact number available for                            emergencies. The signs and symptoms of potential                            delayed complications were discussed with the                            patient. Return to normal activities tomorrow.                             Written discharge instructions were provided to the                            patient.                           - Resume previous diet.                           - Continue present medications.                           - Await pathology results.                           - Repeat colonoscopy in 7 years for surveillance.                           - Return to GI clinic in 3 months. Procedure Code(s):        --- Professional ---                           [phone], Colonoscopy, flexible; with removal of                            tumor(s), polyp(s), or other lesion(s) by snare                            technique                           45380, 59, Colonoscopy, flexible; with biopsy,                            single or multiple Diagnosis Code(s):        ---  Professional ---                           Z12.11, Encounter for screening for malignant                            neoplasm of colon                           D12.3, Benign neoplasm of transverse colon (hepatic                            flexure or splenic flexure)                           D12.5, Benign neoplasm of sigmoid colon                           K64.8, Other hemorrhoids CPT copyright 2022 American Medical Association. All rights reserved. The codes documented in this report are preliminary and upon coder review may  be revised to meet current compliance requirements. Rolando Cliche. Mordechai April, DO Rolando Cliche. Mordechai April, DO 02/06/2024 11:32:51 AM This report has been signed electronically. Number of Addenda: 0

## 2024-02-06 NOTE — Anesthesia Preprocedure Evaluation (Signed)
 Anesthesia Evaluation  Patient identified by MRN, date of birth, ID band Patient awake    Reviewed: Allergy & Precautions, H&P , NPO status , Patient's Chart, lab work & pertinent test results, reviewed documented beta blocker date and time   Airway Mallampati: II  TM Distance: >3 FB Neck ROM: full    Dental no notable dental hx.    Pulmonary neg pulmonary ROS, former smoker   Pulmonary exam normal breath sounds clear to auscultation       Cardiovascular Exercise Tolerance: Good + angina at rest + CAD and + Past MI  negative cardio ROS  Rhythm:regular Rate:Normal     Neuro/Psych   Anxiety Depression    negative neurological ROS  negative psych ROS   GI/Hepatic negative GI ROS, Neg liver ROS,GERD  ,,  Endo/Other  negative endocrine ROSHypothyroidism    Renal/GU negative Renal ROS  negative genitourinary   Musculoskeletal  (+) Arthritis ,    Abdominal   Peds negative pediatric ROS (+)  Hematology negative hematology ROS (+)   Anesthesia Other Findings   Reproductive/Obstetrics negative OB ROS                             Anesthesia Physical Anesthesia Plan  ASA: 3  Anesthesia Plan: General   Post-op Pain Management:    Induction:   PONV Risk Score and Plan:   Airway Management Planned:   Additional Equipment:   Intra-op Plan:   Post-operative Plan:   Informed Consent: I have reviewed the patients History and Physical, chart, labs and discussed the procedure including the risks, benefits and alternatives for the proposed anesthesia with the patient or authorized representative who has indicated his/her understanding and acceptance.     Dental Advisory Given  Plan Discussed with: CRNA  Anesthesia Plan Comments:        Anesthesia Quick Evaluation

## 2024-02-06 NOTE — Transfer of Care (Signed)
 Immediate Anesthesia Transfer of Care Note  Patient: Sarah Fox  Procedure(s) Performed: COLONOSCOPY WITH PROPOFOL  BIOPSY POLYPECTOMY  Patient Location: Short Stay  Anesthesia Type:General  Level of Consciousness: awake  Airway & Oxygen Therapy: Patient Spontanous Breathing  Post-op Assessment: Report given to RN  Post vital signs: Reviewed and stable  Last Vitals:  Vitals Value Taken Time  BP    Temp    Pulse    Resp    SpO2      Last Pain:  Vitals:   02/06/24 1103  TempSrc:   PainSc: 0-No pain         Complications: No notable events documented.

## 2024-02-06 NOTE — H&P (Signed)
 Primary Care Physician:  Yevette Hem, FNP Primary Gastroenterologist:  Dr. Mordechai April  Pre-Procedure History & Physical: HPI:  Sarah Fox is a 58 y.o. female is here for a colonoscopy for colon cancer screening purposes.   Past Medical History:  Diagnosis Date   Acute ST elevation myocardial infarction (STEMI) of anterior wall (HCC)    Allergy    CAD in native artery    Cardiac arrest with ventricular fibrillation (HCC) 10/2017   with acute MI and hypokalemia   Coronary artery disease    GERD (gastroesophageal reflux disease)    Ischemic cardiomyopathy 10/2017   Myocardial infarction acute (HCC) 10/2017   Thyroid  disease    hypo   VF (ventricular fibrillation) (HCC)     Past Surgical History:  Procedure Laterality Date   CORONARY STENT INTERVENTION N/A 11/29/2017   Procedure: CORONARY STENT INTERVENTION;  Surgeon: Arty Binning, MD;  Location: [facility] INVASIVE CV LAB;  Service: Cardiovascular;  Laterality: N/A;   CORONARY/GRAFT ACUTE MI REVASCULARIZATION N/A 11/29/2017   Procedure: Coronary/Graft Acute MI Revascularization;  Surgeon: Arty Binning, MD;  Location: [facility] INVASIVE CV LAB;  Service: Cardiovascular;  Laterality: N/A;   LEFT HEART CATH AND CORONARY ANGIOGRAPHY N/A 11/29/2017   Procedure: LEFT HEART CATH AND CORONARY ANGIOGRAPHY;  Surgeon: Arty Binning, MD;  Location: [facility] INVASIVE CV LAB;  Service: Cardiovascular;  Laterality: N/A;   LUMBAR DISC SURGERY     L5-S1    Prior to Admission medications   Medication Sig Start Date End Date Taking? Authorizing Provider  escitalopram  (LEXAPRO ) 20 MG tablet Take 1 tablet (20 mg total) by mouth daily. 09/12/23  Yes Hawks, Christy A, FNP  levothyroxine  (SYNTHROID ) 88 MCG tablet Take 1 tablet (88 mcg total) by mouth daily. 04/11/23  Yes Hawks, Christy A, FNP  losartan  (COZAAR ) 25 MG tablet TAKE 1 TABLET (25 MG TOTAL) BY MOUTH DAILY. 07/18/23  Yes Chandrasekhar, Mahesh A, MD  metoprolol  succinate (TOPROL -XL) 25 MG 24 hr tablet Take 1  tablet (25 mg total) by mouth daily. 04/15/23  Yes Thukkani, Arun K, MD  pantoprazole  (PROTONIX ) 40 MG tablet Take 1 tablet (40 mg total) by mouth 2 (two) times daily. 04/15/23  Yes Thukkani, Arun K, MD  atorvastatin  (LIPITOR ) 40 MG tablet Take 1 tablet (40 mg total) by mouth daily. 03/02/23   Thukkani, Arun K, MD  clopidogrel  (PLAVIX ) 75 MG tablet TAKE 1 TABLET BY MOUTH EVERY DAY 06/14/23   Gloriann Larger A, MD  empagliflozin  (JARDIANCE ) 10 MG TABS tablet Take 1 tablet (10 mg total) by mouth daily before breakfast. 02/25/23   Chandrasekhar, Mahesh A, MD  fluticasone  (FLONASE ) 50 MCG/ACT nasal spray SPRAY 2 SPRAYS INTO EACH NOSTRIL EVERY DAY 10/24/23   Tommas Fragmin A, FNP  nitroGLYCERIN  (NITROSTAT ) 0.4 MG SL tablet Place 1 tablet (0.4 mg total) under the tongue every 5 (five) minutes as needed for chest pain. 02/15/20   Arty Binning, MD    Allergies as of 01/04/2024 - Review Complete 12/22/2023  Allergen Reaction Noted   Sulfa  antibiotics Rash 05/09/2020    Family History  Problem Relation Age of Onset   Breast cancer Mother 83   Other Father    Breast cancer Sister 8   Breast cancer Cousin 65 - 69       60s was a 2nd ocurrence   Heart disease Brother    Breast cancer Other    Breast cancer Other    Colon cancer Neg Hx    Esophageal cancer  Neg Hx    Rectal cancer Neg Hx    Stomach cancer Neg Hx     Social History   Socioeconomic History   Marital status: Married    Spouse name: Not on file   Number of children: 1   Years of education: Not on file   Highest education level: Not on file  Occupational History   Occupation: Mail carrier    Employer: [employer]  Tobacco Use   Smoking status: Former    Current packs/day: 0.00    Average packs/day: 0.5 packs/day for 20.0 years (10.0 ttl pk-yrs)    Types: Cigarettes    Start date: 11/28/1997    Quit date: 11/28/2017    Years since quitting: 6.1   Smokeless tobacco: Never  Vaping Use   Vaping status: Never Used   Substance and Sexual Activity   Alcohol use: Yes    Alcohol/week: 28.0 standard drinks of alcohol    Types: 28 Cans of beer per week    Comment: 4 16 oz beers nightly   Drug use: No   Sexual activity: Yes    Comment: ptp states she cannot be pregnant, but not using anything to not get prenant  Other Topics Concern   Not on file  Social History Narrative   Married with one child.   Social Drivers of Corporate investment banker Strain: Not on file  Food Insecurity: Not on file  Transportation Needs: Not on file  Physical Activity: Not on file  Stress: Stress Concern Present (11/29/2017)   [employer] of Occupational Health - Occupational Stress Questionnaire    Feeling of Stress : Very much  Social Connections: Not on file  Intimate Partner Violence: Not on file    Review of Systems: See HPI, otherwise negative ROS  Physical Exam: Vital signs in last 24 hours: Temp:  [97.8 F (36.6 C)] 97.8 F (36.6 C) (02/10 1006) Pulse Rate:  [58] 58 (02/10 1006) Resp:  [14] 14 (02/10 1006) BP: (140)/(71) 140/71 (02/10 1006) SpO2:  [100 %] 100 % (02/10 1006) Weight:  [65.8 kg] 65.8 kg (02/10 1006)   General:   Alert,  Well-developed, well-nourished, pleasant and cooperative in NAD Head:  Normocephalic and atraumatic. Eyes:  Sclera clear, no icterus.   Conjunctiva pink. Ears:  Normal auditory acuity. Nose:  No deformity, discharge,  or lesions. Msk:  Symmetrical without gross deformities. Normal posture. Extremities:  Without clubbing or edema. Neurologic:  Alert and  oriented x4;  grossly normal neurologically. Skin:  Intact without significant lesions or rashes. Psych:  Alert and cooperative. Normal mood and affect.  Impression/Plan: Sarah Fox is here for a colonoscopy to be performed for colon cancer screening purposes.  The risks of the procedure including infection, bleed, or perforation as well as benefits, limitations, alternatives and imponderables have been  reviewed with the patient. Questions have been answered. All parties agreeable.

## 2024-02-06 NOTE — Discharge Instructions (Signed)
  Colonoscopy Discharge Instructions  Read the instructions outlined below and refer to this sheet in the next few weeks. These discharge instructions provide you with general information on caring for yourself after you leave the hospital. Your doctor may also give you specific instructions. While your treatment has been planned according to the most current medical practices available, unavoidable complications occasionally occur.   ACTIVITY You may resume your regular activity, but move at a slower pace for the next 24 hours.  Take frequent rest periods for the next 24 hours.  Walking will help get rid of the air and reduce the bloated feeling in your belly (abdomen).  No driving for 24 hours (because of the medicine (anesthesia) used during the test).   Do not sign any important legal documents or operate any machinery for 24 hours (because of the anesthesia used during the test).  NUTRITION Drink plenty of fluids.  You may resume your normal diet as instructed by your doctor.  Begin with a light meal and progress to your normal diet. Heavy or fried foods are harder to digest and may make you feel sick to your stomach (nauseated).  Avoid alcoholic beverages for 24 hours or as instructed.  MEDICATIONS You may resume your normal medications unless your doctor tells you otherwise.  WHAT YOU CAN EXPECT TODAY Some feelings of bloating in the abdomen.  Passage of more gas than usual.  Spotting of blood in your stool or on the toilet paper.  IF YOU HAD POLYPS REMOVED DURING THE COLONOSCOPY: No aspirin  products for 7 days or as instructed.  No alcohol for 7 days or as instructed.  Eat a soft diet for the next 24 hours.  FINDING OUT THE RESULTS OF YOUR TEST Not all test results are available during your visit. If your test results are not back during the visit, make an appointment with your caregiver to find out the results. Do not assume everything is normal if you have not heard from your  caregiver or the medical facility. It is important for you to follow up on all of your test results.  SEEK IMMEDIATE MEDICAL ATTENTION IF: You have more than a spotting of blood in your stool.  Your belly is swollen (abdominal distention).  You are nauseated or vomiting.  You have a temperature over 101.  You have abdominal pain or discomfort that is severe or gets worse throughout the day.   Your colonoscopy revealed 2 polyp(s) which I removed successfully. Await pathology results, my office will contact you. I recommend repeating colonoscopy in 7 years for surveillance purposes.   Overall, your colon appeared very healthy.  I did not see any active inflammation indicative of underlying inflammatory bowel disease such as Crohn's disease or ulcerative colitis throughout your colon or end portion of your small bowel.  I took biopsies of your colon to further evaluate.  Await pathology results, my office will contact you.  Follow up in GI office in 2-3 months   I hope you have a great rest of your week!  Rolando Cliche. Mordechai April, D.O. Gastroenterology and Hepatology [facility]

## 2024-02-07 ENCOUNTER — Encounter (HOSPITAL_COMMUNITY): Payer: Self-pay | Admitting: Internal Medicine

## 2024-02-07 LAB — SURGICAL PATHOLOGY

## 2024-02-10 ENCOUNTER — Ambulatory Visit: Payer: Federal, State, Local not specified - PPO | Admitting: Family Medicine

## 2024-02-10 ENCOUNTER — Encounter: Payer: Self-pay | Admitting: Family Medicine

## 2024-02-10 VITALS — BP 125/75 | HR 58 | Temp 98.0°F | Wt 144.8 lb

## 2024-02-10 DIAGNOSIS — M545 Low back pain, unspecified: Secondary | ICD-10-CM

## 2024-02-10 DIAGNOSIS — S39012A Strain of muscle, fascia and tendon of lower back, initial encounter: Secondary | ICD-10-CM

## 2024-02-10 LAB — MICROSCOPIC EXAMINATION
RBC, Urine: NONE SEEN /[HPF] (ref 0–2)
Renal Epithel, UA: NONE SEEN /[HPF]
Yeast, UA: NONE SEEN

## 2024-02-10 LAB — URINALYSIS, COMPLETE
Bilirubin, UA: NEGATIVE
Nitrite, UA: NEGATIVE
RBC, UA: NEGATIVE
Specific Gravity, UA: >1.03 — ABNORMAL HIGH (ref 1.005–1.030)
Urobilinogen, Ur: 0.2 mg/dL (ref 0.2–1.0)
pH, UA: 5.5 (ref 5.0–7.5)

## 2024-02-10 MED ORDER — PREDNISONE 20 MG PO TABS: ORAL_TABLET | ORAL | 0 refills | Status: DC

## 2024-02-10 MED ORDER — BACLOFEN 10 MG PO TABS
10.0000 mg | ORAL_TABLET | Freq: Three times a day (TID) | ORAL | 0 refills | Status: DC
Start: 2024-02-10 — End: 2024-06-04

## 2024-02-10 NOTE — Progress Notes (Signed)
BP 125/75   Pulse (!) 58   Temp 98 F (36.7 C)   Wt 144 lb 12.8 oz (65.7 kg)   LMP 05/11/2021   SpO2 96%   BMI 24.10 kg/m    Subjective:   Patient ID: Sarah Fox, female    DOB: [birth year], 58 y.o.   MRN: [id number]  HPI: Sarah Fox is a 58 y.o. female presenting on 02/10/2024 for Back Pain (Back pain on the left side, gets worse with movement and painful to the touch)   HPI Patient comes in with left lower back pain, it is very pinpoint in a certain spot.  She says it hurts more when she twists or bends.  She says has been going on for about a week but then for some reason this morning she went to work and it was a lot worse.  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  She denies anything major or heavy that she has done over the past week that would have caused her to be worse.  She denies slipping on any ice or falling or causing any trauma.  She denies any urinary symptoms or bowel symptoms.  She did just have a colonoscopy last week but denies any abdominal pain.  Relevant past medical, surgical, family and social history reviewed and updated as indicated. Interim medical history since our last visit reviewed. Allergies and medications reviewed and updated.  Review of Systems  Constitutional:  Negative for chills and fever.  Eyes:  Negative for redness and visual disturbance.  Respiratory:  Negative for chest tightness and shortness of breath.   Cardiovascular:  Negative for chest pain and leg swelling.  Gastrointestinal:  Negative for abdominal distention, abdominal pain, blood in stool, constipation, diarrhea, nausea and vomiting.  Genitourinary:  Negative for decreased urine volume, difficulty urinating, dysuria, flank pain, hematuria and urgency.  Musculoskeletal:  Positive for back pain. Negative for gait problem.  Skin:  Negative for rash.  Neurological:  Negative for light-headedness and headaches.   Psychiatric/Behavioral:  Negative for agitation and behavioral problems.   All other systems reviewed and are negative.   Per HPI unless specifically indicated above   Allergies as of 02/10/2024       Reactions   Sulfa Antibiotics Rash        Medication List        Accurate as of February 10, 2024  9:10 AM. If you have any questions, ask your nurse or doctor.          atorvastatin 40 MG tablet Commonly known as: LIPITOR Take 1 tablet (40 mg total) by mouth daily.   baclofen 10 MG tablet Commonly known as: LIORESAL Take 1 tablet (10 mg total) by mouth 3 (three) times daily. Started by: Elige Radon Janathan Bribiesca   clopidogrel 75 MG tablet Commonly known as: PLAVIX TAKE 1 TABLET BY MOUTH EVERY DAY   empagliflozin 10 MG Tabs tablet Commonly known as: Jardiance Take 1 tablet (10 mg total) by mouth daily before breakfast.   escitalopram 20 MG tablet Commonly known as: LEXAPRO Take 1 tablet (20 mg total) by mouth daily.   fluticasone 50 MCG/ACT nasal spray Commonly known as: FLONASE SPRAY 2 SPRAYS INTO EACH NOSTRIL EVERY DAY   levothyroxine 88 MCG tablet Commonly known as: SYNTHROID Take 1 tablet (88 mcg total) by mouth daily.   losartan 25 MG tablet Commonly known as: COZAAR TAKE 1 TABLET (  25 MG TOTAL) BY MOUTH DAILY.   metoprolol succinate 25 MG 24 hr tablet Commonly known as: TOPROL-XL Take 1 tablet (25 mg total) by mouth daily.   nitroGLYCERIN 0.4 MG SL tablet Commonly known as: Nitrostat Place 1 tablet (0.4 mg total) under the tongue every 5 (five) minutes as needed for chest pain.   pantoprazole 40 MG tablet Commonly known as: PROTONIX Take 1 tablet (40 mg total) by mouth 2 (two) times daily.   predniSONE 20 MG tablet Commonly known as: DELTASONE 2 po at same time daily for 5 days Started by: Elige Radon Conception Doebler         Objective:   BP 125/75   Pulse (!) 58   Temp 98 F (36.7 C)   Wt 144 lb 12.8 oz (65.7 kg)   LMP 05/11/2021   SpO2 96%    BMI 24.10 kg/m   Wt Readings from Last 3 Encounters:  02/10/24 144 lb 12.8 oz (65.7 kg)  02/06/24 145 lb (65.8 kg)  12/22/23 147 lb 9.6 oz (67 kg)    Physical Exam Vitals and nursing note reviewed.  Constitutional:      General: She is not in acute distress.    Appearance: She is well-developed. She is not diaphoretic.  Eyes:     Conjunctiva/sclera: Conjunctivae normal.  Cardiovascular:     Rate and Rhythm: Normal rate and regular rhythm.     Heart sounds: Normal heart sounds. No murmur heard. Pulmonary:     Effort: Pulmonary effort is normal. No respiratory distress.     Breath sounds: Normal breath sounds. No wheezing.  Abdominal:     General: Abdomen is flat. Bowel sounds are normal. There is no distension.     Palpations: Abdomen is soft.     Tenderness: There is no abdominal tenderness. There is no right CVA tenderness, left CVA tenderness, guarding or rebound.  Musculoskeletal:        General: Normal range of motion.     Lumbar back: Spasms and tenderness present. No swelling, deformity or bony tenderness. Normal range of motion. Negative right straight leg raise test and negative left straight leg raise test.       Back:  Skin:    General: Skin is warm and dry.     Findings: No rash.  Neurological:     Mental Status: She is alert and oriented to person, place, and time.     Coordination: Coordination normal.  Psychiatric:        Behavior: Behavior normal.       Assessment & Plan:   Problem List Items Addressed This Visit   None Visit Diagnoses       Strain of lumbar paraspinous muscle, initial encounter    -  Primary   Relevant Medications   baclofen (LIORESAL) 10 MG tablet   predniSONE (DELTASONE) 20 MG tablet     Acute midline low back pain, unspecified whether sciatica present       Relevant Medications   baclofen (LIORESAL) 10 MG tablet   predniSONE (DELTASONE) 20 MG tablet   Other Relevant Orders   Urinalysis, Complete   Urine Culture      Does not seem to be abdominal in nature, urinalysis shows 0-5 WBCs and 0-10 epithelial cells and moderate bacteria and 3+ glucose but she is on Jardiance for that is not surprising.  Likely musculoskeletal in nature such as a muscular strain.  Follow up plan: Return if symptoms worsen or fail to improve.  Counseling  provided for all of the vaccine components Orders Placed This Encounter  Procedures   Urine Culture   Urinalysis, Complete    Arville Care, MD [place] 02/10/2024, 9:10 AM

## 2024-02-13 ENCOUNTER — Telehealth: Payer: Self-pay

## 2024-02-13 LAB — URINE CULTURE

## 2024-02-13 NOTE — Telephone Encounter (Signed)
Pt made aware of Dr. Darrol Poke recommendations. She is concerned about the results being abnormal. Advised pt that there is no infections and based off of the results she needs to increase her water intake. States that Dettinger told her she had a pulled muscle and she is still in pain. Pt does have medication to take.  Informed that she should give it more time/few more weeks to see improvement. Pt understood and will call back if needed.

## 2024-02-13 NOTE — Telephone Encounter (Signed)
Copied from CRM [id number]. Topic: Clinical - Lab/Test Results >> Feb 13, 2024  1:00 PM Sarah Fox wrote: Reason for CRM: Patient calling about her urine test results, wants antibiotics called in, please call patient [phone]

## 2024-02-13 NOTE — Telephone Encounter (Signed)
Patient's urine culture did not show any signs of infection

## 2024-02-14 ENCOUNTER — Other Ambulatory Visit: Payer: Self-pay | Admitting: Internal Medicine

## 2024-02-14 ENCOUNTER — Other Ambulatory Visit: Payer: Self-pay | Admitting: Family

## 2024-02-20 ENCOUNTER — Telehealth: Payer: Self-pay | Admitting: Pharmacy Technician

## 2024-02-20 ENCOUNTER — Ambulatory Visit: Payer: Federal, State, Local not specified - PPO | Attending: Physician Assistant | Admitting: Physician Assistant

## 2024-02-20 ENCOUNTER — Encounter: Payer: Self-pay | Admitting: Physician Assistant

## 2024-02-20 ENCOUNTER — Other Ambulatory Visit (HOSPITAL_COMMUNITY): Payer: Self-pay

## 2024-02-20 VITALS — BP 126/76 | HR 72 | Ht 65.0 in | Wt 144.2 lb

## 2024-02-20 DIAGNOSIS — I5020 Unspecified systolic (congestive) heart failure: Secondary | ICD-10-CM

## 2024-02-20 DIAGNOSIS — I2109 ST elevation (STEMI) myocardial infarction involving other coronary artery of anterior wall: Secondary | ICD-10-CM

## 2024-02-20 DIAGNOSIS — I255 Ischemic cardiomyopathy: Secondary | ICD-10-CM | POA: Diagnosis not present

## 2024-02-20 DIAGNOSIS — K219 Gastro-esophageal reflux disease without esophagitis: Secondary | ICD-10-CM

## 2024-02-20 DIAGNOSIS — I2510 Atherosclerotic heart disease of native coronary artery without angina pectoris: Secondary | ICD-10-CM

## 2024-02-20 DIAGNOSIS — E785 Hyperlipidemia, unspecified: Secondary | ICD-10-CM | POA: Diagnosis not present

## 2024-02-20 NOTE — Telephone Encounter (Signed)
 Pharmacy Patient Advocate Encounter   Received notification from Fax that prior authorization for jardiance is required/requested.   Insurance verification completed.   The patient is insured through [employer] .   Per test claim: PA required; PA started via CoverMyMeds. KEY [id number] . Waiting for clinical questions to populate.   I also did the key another time but didn't get that key #. Patient is under last name Sheffield Slider in [facility].

## 2024-02-20 NOTE — Telephone Encounter (Signed)
 Pharmacy Patient Advocate Encounter  Received notification from [facility] that Prior Authorization for jardiance has been APPROVED. Unable to obtain price due to refill too soon rejection, last fill date 02/15/24 next available fill date 03/09/24

## 2024-02-20 NOTE — Progress Notes (Signed)
 Cardiology Office Note:  .   Date:  02/20/2024  ID:  Sarah Fox, DOB 08-03-1966, MRN [id number] PCP: Junie Spencer, FNP  [facility] Providers Cardiologist:  Lesleigh Noe, MD (Inactive) {  History of Present Illness: .   Sarah Fox is a 58 y.o. female with the below medical history here for follow-up appointment.  FOCUSED PROBLEM LIST:   1.  Anterior ST elevation myocardial infarction status post PCI of the proximal LAD 2018 2.  Hyperlipidemia 3.  Hypertension 4.  Hypothyroidism 5.  Ischemic cardiomyopathy ejection fraction 45 to 50% TTE 2022 6.  GERD  The patient is a 24 year olds with the indicated medical history here for routine cardiology follow-up.  Last seen in February 2024.  At that time was doing well.  Echocardiogram showed improved anterior wall heart function and Coumadin was discontinued.  Denied any cardiovascular complaints at that visit.  Was seen 02/25/2023 and was doing well.  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  Denied any chest pain or significant SOB.  No peripheral edema or PND.  Remains abstaining from alcohol abuse.  Has not required any sublingual nitroglycerin.  Compliant with medications.  Today, she presents with a history of heart disease, hypertension, and diabetes with a complaint of hand pain and numbness during sleep. The patient reports that the hand pain started recently and is associated with soreness. 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 The patient has noticed an increase in urinary frequency but denies any burning sensation. The patient also reports that her hands go numb during sleep, regardless of the position she is in, and she wakes up four to five times a night due to this numbness.  Reports no shortness of breath  nor dyspnea on exertion. Reports no chest pain, pressure, or tightness. No edema, orthopnea, PND. Reports no palpitations.   Discussed the use of AI scribe software for clinical note transcription with the patient, who gave verbal consent to proceed.  ROS: pertinent ROS in HPI  Studies Reviewed: Marland Kitchen        CARDIAC CATHETERIZATION   CARDIAC CATHETERIZATION 11/29/2017   Narrative  Acute anterior myocardial infarction due to occlusion of the proximal LAD with 99% thrombus contained obstruction with TIMI grade II flow on initial angiography.  Ventricular fibrillation requiring electrical conversion x3 in succession prior to PCI.  CPR was not necessary.  Hypokalemia of 3.0 was identified.  Successful LAD PCI following aspiration thrombectomy reducing 99% stenosis with TIMI grade II antegrade flow to 0% with TIMI grade III flow.  The 3.5 x 22 mm Onyx was postdilated to 3.75 mm at high pressure.  Normal right coronary artery.  Normal left main coronary artery.  Normal circumflex coronary artery.  Acute diastolic heart failure documented by moderate elevation in LVEDP.   RECOMMENDATIONS:    Aggrastat times 18 hours  Aspirin and Brilinta times 12 months  Risk factor modification as needed: Hemoglobin A1c, lipid panel, have been ordered.  Beta-blocker and ACE inhibitor therapy should be started once hemodynamics were stable.  Discharge at 48 hours if remains stable.   Findings Coronary Findings Diagnostic  Dominance: Right   Left Anterior Descending Ost LAD to Prox LAD lesion is 99% stenosed.   Intervention   Ost LAD  to Prox LAD lesion Stent A stent was successfully placed. Post-Intervention Lesion Assessment There is a 0% residual stenosis post intervention.   STRESS TESTS   NM MYOCAR MULTI W/SPECT W 02/15/2021   Narrative CLINICAL DATA:  Chest pain. Shortness of breath. History of MI with VF arrest.   EXAM: MYOCARDIAL IMAGING WITH SPECT (REST AND  PHARMACOLOGIC-STRESS)   GATED LEFT VENTRICULAR WALL MOTION STUDY   LEFT VENTRICULAR EJECTION FRACTION   TECHNIQUE: Standard myocardial SPECT imaging was performed after resting intravenous injection of 9.7 mCi Tc-10m tetrofosmin. Subsequently, intravenous infusion of Lexiscan was performed under the supervision of the Cardiology staff. At peak effect of the drug, 32 mCi Tc-12m tetrofosmin was injected intravenously and standard myocardial SPECT imaging was performed. Quantitative gated imaging was also performed to evaluate left ventricular wall motion, and estimate left ventricular ejection fraction.   COMPARISON:  None.   FINDINGS: Perfusion: No decreased activity in the left ventricle on stress imaging to suggest reversible ischemia or infarction.   Wall Motion: Normal left ventricular wall motion. No left ventricular dilation.   Left Ventricular Ejection Fraction: 52 %   End diastolic volume 113 ml   End systolic volume 54 ml   IMPRESSION: 1. No reversible ischemia or infarction.   2. Normal left ventricular wall motion.   3. Left ventricular ejection fraction 52%. Of note, the end-diastolic and end systolic volumes are mildly abnormal.   4. Non invasive risk stratification*: Low   *2012 Appropriate Use Criteria for Coronary Revascularization Focused Update: J Am Coll Cardiol. 2012;59(9):857-881. http://content.dementiazones.com.aspx?articleid=1201161     Electronically Signed By: Gerome Sam III M.D On: 02/15/2021 15:32   ECHOCARDIOGRAM   ECHOCARDIOGRAM LIMITED 05/18/2021   Narrative ECHOCARDIOGRAM LIMITED REPORT       Patient Name:   Sarah Fox Date of Exam: 05/18/2021 Medical Rec #:  [id number]       Height:       65.0 in Accession #:    [id number]      Weight:       160.0 lb Date of Birth:  April 05, 1966       BSA:          1.799 m Patient Age:    54 years        BP:           120/68 mmHg Patient Gender: F               HR:           76  bpm. Exam Location:  [address]   Procedure: Limited Echo, Limited Color Doppler, Cardiac Doppler and Intracardiac Opacification Agent   Indications:    Left Ventricular Mural Thrombus   History:        Patient has prior history of Echocardiogram examinations, most recent 02/15/2021. Ischemic Cardiomyopathy; Previous Myocardial Infarction and CAD.   Sonographer:    Thurman Coyer RDCS Referring Phys: [id number] Dienes [facility]   IMPRESSIONS     1. Basal and mid septum akinetic apical hypokinesis Apical partially calcified band and shadowing artifact Doubt associated thrombus Can consider f/u cardiac MRI with long TI post gadolinium imaging if concern is high . Left ventricular ejection fraction, by estimation, is 45 to 50%. The left ventricle has mildly decreased function. The left ventricle demonstrates regional wall motion abnormalities (see scoring diagram/findings for description). Left ventricular diastolic parameters were normal. 2. Right ventricular systolic function is normal. The right ventricular size is normal. 3. The mitral valve is normal  in structure. Trivial mitral valve regurgitation. No evidence of mitral stenosis. 4. The aortic valve is normal in structure. Aortic valve regurgitation is not visualized. No aortic stenosis is present. 5. The inferior vena cava is normal in size with greater than 50% respiratory variability, suggesting right atrial pressure of 3 mmHg.   FINDINGS Left Ventricle: Basal and mid septum akinetic apical hypokinesis Apical partially calcified band and shadowing artifact Doubt associated thrombus Can consider f/u cardiac MRI with long TI post gadolinium imaging if concern is high. Left ventricular ejection fraction, by estimation, is 45 to 50%. The left ventricle has mildly decreased function. The left ventricle demonstrates regional wall motion abnormalities. Definity contrast agent was given IV to delineate the left ventricular endocardial borders.  The left ventricular internal cavity size was normal in size. There is no left ventricular hypertrophy. Left ventricular diastolic parameters were normal.   Right Ventricle: The right ventricular size is normal. No increase in right ventricular wall thickness. Right ventricular systolic function is normal.   Left Atrium: Left atrial size was normal in size.   Right Atrium: Right atrial size was normal in size.   Pericardium: There is no evidence of pericardial effusion.   Mitral Valve: The mitral valve is normal in structure. Trivial mitral valve regurgitation. No evidence of mitral valve stenosis.   Tricuspid Valve: The tricuspid valve is normal in structure. Tricuspid valve regurgitation is trivial. No evidence of tricuspid stenosis.   Aortic Valve: The aortic valve is normal in structure. Aortic valve regurgitation is not visualized. No aortic stenosis is present.   Pulmonic Valve: The pulmonic valve was normal in structure. Pulmonic valve regurgitation is not visualized. No evidence of pulmonic stenosis.   Aorta: The aortic root is normal in size and structure.   Venous: The inferior vena cava is normal in size with greater than 50% respiratory variability, suggesting right atrial pressure of 3 mmHg.   IAS/Shunts: No atrial level shunt detected by color flow Doppler.   LEFT VENTRICLE PLAX 2D LVIDd:         5.50 cm     Diastology LVIDs:         4.50 cm     LV e' medial:    5.87 cm/s LV PW:         0.80 cm     LV E/e' medial:  12.8 LV IVS:        0.70 cm     LV e' lateral:   6.96 cm/s LVOT diam:     2.20 cm     LV E/e' lateral: 10.8 LVOT Area:     3.80 cm   LV Volumes (MOD) LV vol d, MOD A2C: 89.4 ml LV vol d, MOD A4C: 75.7 ml LV vol s, MOD A2C: 47.6 ml LV vol s, MOD A4C: 39.3 ml LV SV MOD A2C:     41.8 ml LV SV MOD A4C:     75.7 ml LV SV MOD BP:      37.9 ml   LEFT ATRIUM         Index LA diam:    3.50 cm 1.95 cm/m   AORTA Ao Root diam: 2.80 cm   MITRAL VALVE                TRICUSPID VALVE MV Area (PHT): 3.12 cm    TR Peak grad:   23.4 mmHg MV Decel Time: 243 msec    TR Vmax:        242.00 cm/s  MV E velocity: 74.90 cm/s MV A velocity: 87.00 cm/s  SHUNTS MV E/A ratio:  0.86        Systemic Diam: 2.20 cm   Charlton Haws MD Electronically signed by Charlton Haws MD Signature Date/Time: 05/18/2021/2:27:03 PM       Final    MONITORS   CARDIAC EVENT MONITOR 03/25/2021   Narrative  Basic underlying rhythm is normal sinus rhythm.  No significant burden of atrial or ventricular premature beats were noted.  No sustained tacky arrhythmias  Palpitations did not correlate with rhythm disturbance.   Overall normal study without malignant arrhythmias or bradycardia arrhythmias.      Physical Exam:   VS:  BP 126/76   Pulse 72   Ht 5\' 5"  (1.651 m)   Wt 144 lb 3.2 oz (65.4 kg)   LMP 05/11/2021   SpO2 96%   BMI 24.00 kg/m    Wt Readings from Last 3 Encounters:  02/20/24 144 lb 3.2 oz (65.4 kg)  02/10/24 144 lb 12.8 oz (65.7 kg)  02/06/24 145 lb (65.8 kg)    GEN: Well nourished, well developed in no acute distress NECK: No JVD; No carotid bruits CARDIAC: RRR, no murmurs, rubs, gallops RESPIRATORY:  Clear to auscultation without rales, wheezing or rhonchi  ABDOMEN: Soft, non-tender, non-distended EXTREMITIES:  No edema; No deformity   ASSESSMENT AND PLAN: .    Hypertension Well controlled on current medications (Cozaar 25mg , Metoprolol succinate 25mg ). No reported swelling in feet. -Continue current medications. -Monitor blood pressure at home, especially during episodes of fatigue.  Hyperlipidemia On Lipitor, no reported muscle aches or pains. -Order fasting lipid panel.  Possible Urinary Tract Infection Reports increased urinary frequency, urine test showed moderate bacteria and sugar in urine. No reported burning. + leukocytes  -Contact primary care provider to reevaluate urine test results and consider antibiotic  treatment.  Intermittent Hand Numbness Reports waking up 4-5 times a night with numb hands, resolves quickly with movement. No reported coldness in hands. -Advise to monitor symptoms and consider evaluation if symptoms persist or worsen.  Back Pain Reports intermittent back pain, previously thought to be a kidney infection. No improvement with prednisone and muscle relaxers. -Advise to monitor symptoms and consider reevaluation if symptoms persist or worsen. Suggest over-the-counter pain patches for localized relief.  Tricuspid Regurgitation Mild to moderate leak noted on last echocardiogram. No reported worsening of symptoms. -Consider repeat echocardiogram in 2 years or sooner if symptoms worsen.  General Health Maintenance -Advise to maintain physical activity as tolerated. -Return for fasting labs.  Ischemic cardiomyopathy with EF 45 to 50% GERD -reviewed recent echo with LVEF 50-55% -fatigue is about the same -mild to moderate TR      Dispo: She can follow-up in a year with Dr. Lynnette Caffey  Signed, Sharlene Dory, PA-C

## 2024-02-20 NOTE — Patient Instructions (Signed)
 Lab Work: Lipid panel  CMP   If you have labs (blood work) drawn today and your tests are completely normal, you will receive your results only by: MyChart Message (if you have MyChart) OR A paper copy in the mail If you have any lab test that is abnormal or we need to change your treatment, we will call you to review the results.  Follow-Up: At [facility], you and your health needs are our priority.  As part of our continuing mission to provide you with exceptional heart care, we have created designated Provider Care Teams.  These Care Teams include your primary Cardiologist (physician) and Advanced Practice Providers (APPs -  Physician Assistants and Nurse Practitioners) who all work together to provide you with the care you need, when you need it.  Your next appointment:   1 year(s)  Provider:   Jari Favre, PA-C or Dr. Lynnette Caffey    Other Instructions   1st Floor: - Lobby - Registration  - Pharmacy  - Lab - Cafe  2nd Floor: - PV Lab - Diagnostic Testing (echo, CT, nuclear med)  3rd Floor: - Vacant  4th Floor: - TCTS (cardiothoracic surgery) - AFib Clinic - Structural Heart Clinic - Vascular Surgery  - Vascular Ultrasound  5th Floor: - HeartCare Cardiology (general and EP) - Clinical Pharmacy for coumadin, hypertension, lipid, weight-loss medications, and med management appointments    Valet parking services will be available as well.

## 2024-02-24 ENCOUNTER — Other Ambulatory Visit: Payer: Self-pay | Admitting: Internal Medicine

## 2024-02-24 ENCOUNTER — Other Ambulatory Visit: Payer: Self-pay | Admitting: Family

## 2024-03-11 ENCOUNTER — Other Ambulatory Visit: Payer: Self-pay | Admitting: Internal Medicine

## 2024-03-11 ENCOUNTER — Other Ambulatory Visit: Payer: Self-pay | Admitting: Family

## 2024-03-12 ENCOUNTER — Ambulatory Visit: Payer: Federal, State, Local not specified - PPO | Admitting: Family

## 2024-03-12 ENCOUNTER — Encounter: Payer: Self-pay | Admitting: Family

## 2024-03-12 VITALS — BP 117/73 | HR 69 | Temp 97.3°F | Ht 65.0 in | Wt 147.2 lb

## 2024-03-12 DIAGNOSIS — I252 Old myocardial infarction: Secondary | ICD-10-CM

## 2024-03-12 DIAGNOSIS — E039 Hypothyroidism, unspecified: Secondary | ICD-10-CM

## 2024-03-12 DIAGNOSIS — F321 Major depressive disorder, single episode, moderate: Secondary | ICD-10-CM

## 2024-03-12 DIAGNOSIS — I5042 Chronic combined systolic (congestive) and diastolic (congestive) heart failure: Secondary | ICD-10-CM

## 2024-03-12 DIAGNOSIS — I2510 Atherosclerotic heart disease of native coronary artery without angina pectoris: Secondary | ICD-10-CM

## 2024-03-12 DIAGNOSIS — E785 Hyperlipidemia, unspecified: Secondary | ICD-10-CM

## 2024-03-12 DIAGNOSIS — F411 Generalized anxiety disorder: Secondary | ICD-10-CM

## 2024-03-12 DIAGNOSIS — Z0001 Encounter for general adult medical examination with abnormal findings: Secondary | ICD-10-CM

## 2024-03-12 DIAGNOSIS — I2583 Coronary atherosclerosis due to lipid rich plaque: Secondary | ICD-10-CM

## 2024-03-12 DIAGNOSIS — Z0000 Encounter for general adult medical examination without abnormal findings: Secondary | ICD-10-CM | POA: Diagnosis not present

## 2024-03-12 DIAGNOSIS — K219 Gastro-esophageal reflux disease without esophagitis: Secondary | ICD-10-CM

## 2024-03-12 NOTE — Progress Notes (Signed)
 Subjective:    Patient ID: Sarah Fox, female    DOB: [birth year], 58 y.o.   MRN: [id number]  Chief Complaint  Patient presents with   Medical Management of Chronic Issues   PT presents to the office today for  CPE and chronic follow up. She is followed by Cardiologists annually for CAD and STEMI in 2018.    Congestive Heart Failure Presents for follow-up visit. Associated symptoms include fatigue. Pertinent negatives include no edema. The symptoms have been stable.  Gastroesophageal Reflux She complains of belching and heartburn. This is a chronic problem. The current episode started more than 1 year ago. The problem occurs rarely. The symptoms are aggravated by certain foods. Associated symptoms include fatigue. She has tried a PPI for the symptoms. The treatment provided moderate relief.  Thyroid Problem Presents for follow-up visit. Symptoms include depressed mood, diarrhea and fatigue. Patient reports no anxiety, constipation or dry skin. The symptoms have been stable.  Arthritis Presents for follow-up visit. She complains of pain and stiffness. Affected locations include the right shoulder and left shoulder. Her pain is at a severity of 0/10 (4 on some days). Associated symptoms include diarrhea and fatigue.  Anxiety Presents for follow-up visit. Symptoms include depressed mood. Patient reports no nervous/anxious behavior or restlessness. Symptoms occur rarely. The severity of symptoms is mild.    Depression        This is a chronic problem.  The current episode started more than 1 year ago.   The problem occurs intermittently.  Associated symptoms include fatigue.  Associated symptoms include no helplessness, no hopelessness, no restlessness and not sad.  Past treatments include SSRIs - Selective serotonin reuptake inhibitors.  Past medical history includes thyroid problem and anxiety.       Review of Systems  Constitutional:  Positive for fatigue.  Gastrointestinal:   Positive for diarrhea and heartburn. Negative for constipation.  Musculoskeletal:  Positive for stiffness.  Psychiatric/Behavioral:  The patient is not nervous/anxious.   All other systems reviewed and are negative.   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[employer]  Tobacco Use   Smoking status: Former    Current packs/day: 0.00    Average packs/day: 0.5 packs/day for 20.0 years (10.0 ttl pk-yrs)    Types: Cigarettes    Start date: 11/28/1997    Quit date: 11/28/2017    Years since quitting: 6.2   Smokeless tobacco: Never  Vaping Use   Vaping status: Never Used  Substance and Sexual Activity   Alcohol use: Yes    Alcohol/week: 28.0 standard drinks of alcohol    Types: 28 Cans of beer per week    Comment: 4 16 oz  beers nightly   Drug use: No   Sexual activity: Yes    Comment: ptp states she cannot be pregnant, but not using anything to not get prenant  Other Topics Concern   Not on file  Social History Narrative   Married with one child.   Social Drivers of Corporate investment banker Strain: Not on file  Food Insecurity: Not on file  Transportation Needs: Not on file  Physical Activity: Not on file  Stress: Stress Concern Present (11/29/2017)   [employer] of Occupational Health - Occupational Stress Questionnaire    Feeling of Stress : Very much  Social Connections: Not on file        Objective:   Physical Exam Vitals reviewed.  Constitutional:      General: She is not in acute distress.    Appearance: She is well-developed.  HENT:     Head: Normocephalic and atraumatic.     Right Ear: Tympanic membrane normal.     Left Ear: Tympanic membrane normal.  Eyes:     Pupils: Pupils are equal, round, and reactive to light.  Neck:     Thyroid: No thyromegaly.  Cardiovascular:     Rate and Rhythm: Normal rate and regular rhythm.     Heart sounds: Normal heart sounds. No murmur heard. Pulmonary:     Effort: Pulmonary effort is normal. No respiratory distress.     Breath sounds: Normal breath sounds. No wheezing.  Abdominal:     General: Bowel sounds are normal. There is no distension.     Palpations: Abdomen is soft.     Tenderness: There is no abdominal tenderness.  Musculoskeletal:        General: No tenderness. Normal range of motion.     Cervical back: Normal range of motion and neck supple.  Skin:    General: Skin is warm and dry.  Neurological:     Mental Status: She is alert and oriented to person, place, and time.     Cranial Nerves: No cranial nerve deficit.     Deep Tendon Reflexes: Reflexes are normal and symmetric.  Psychiatric:        Behavior: Behavior normal.        Thought Content: Thought content normal.        Judgment: Judgment normal.       BP 117/73   Pulse 69   Temp (!) 97.3 F (36.3 C) (Temporal)   Ht 5\' 5"  (1.651 m)   Wt 147 lb 3.2 oz (66.8 kg)   LMP 05/11/2021   SpO2 96%   BMI 24.50 kg/m      Assessment & Plan:  Sarah Fox comes in today with chief complaint of Medical Management of Chronic Issues   Diagnosis and orders addressed: 1. GAD (generalized anxiety disorder) - CMP14+EGFR - CBC with Differential/Platelet  2. Gastroesophageal reflux disease, unspecified whether esophagitis present - CMP14+EGFR - CBC with Differential/Platelet  3. History of ST elevation myocardial infarction (STEMI) - CMP14+EGFR -  CBC with Differential/Platelet  4. Hyperlipidemia LDL goal <70  - CMP14+EGFR - CBC with Differential/Platelet - Lipid panel  5. Hypothyroidism, unspecified type - CMP14+EGFR - CBC with Differential/Platelet - TSH  6. CAD in native artery  - CMP14+EGFR - CBC with Differential/Platelet - Lipid panel  7. Chronic combined systolic and diastolic heart failure (HCC) - CMP14+EGFR - CBC with Differential/Platelet  8. Coronary artery disease due to lipid rich plaque - CMP14+EGFR - CBC with Differential/Platelet  9. Depression, major,  single episode, moderate (HCC) - CMP14+EGFR - CBC with Differential/Platelet  10. Annual physical exam (Primary) - CMP14+EGFR - CBC with Differential/Platelet - Lipid panel  Labs pending Continue current medications  Health Maintenance reviewed Diet and exercise encouraged  Follow up plan: 6 months    Jannifer Rodney, FNP

## 2024-03-12 NOTE — Patient Instructions (Signed)

## 2024-03-13 ENCOUNTER — Other Ambulatory Visit: Payer: Self-pay | Admitting: Internal Medicine

## 2024-03-13 LAB — CBC WITH DIFFERENTIAL/PLATELET
Basophils Absolute: 0.1 10*3/uL (ref 0.0–0.2)
Basos: 1 %
EOS (ABSOLUTE): 0.8 10*3/uL — ABNORMAL HIGH (ref 0.0–0.4)
Eos: 10 %
Hematocrit: 44.2 % (ref 34.0–46.6)
Hemoglobin: 14.6 g/dL (ref 11.1–15.9)
Immature Grans (Abs): 0 10*3/uL (ref 0.0–0.1)
Immature Granulocytes: 0 %
Lymphocytes Absolute: 2.7 10*3/uL (ref 0.7–3.1)
Lymphs: 34 %
MCH: 30.5 pg (ref 26.6–33.0)
MCHC: 33 g/dL (ref 31.5–35.7)
MCV: 93 fL (ref 79–97)
Monocytes Absolute: 0.4 10*3/uL (ref 0.1–0.9)
Monocytes: 6 %
Neutrophils Absolute: 4 10*3/uL (ref 1.4–7.0)
Neutrophils: 49 %
Platelets: 346 10*3/uL (ref 150–450)
RBC: 4.78 x10E6/uL (ref 3.77–5.28)
RDW: 13 % (ref 11.7–15.4)
WBC: 8.1 10*3/uL (ref 3.4–10.8)

## 2024-03-13 LAB — LIPID PANEL
Chol/HDL Ratio: 2.2 ratio (ref 0.0–4.4)
Cholesterol, Total: 143 mg/dL (ref 100–199)
HDL: 64 mg/dL (ref >39)
LDL Chol Calc (NIH): 59 mg/dL (ref 0–99)
Triglycerides: 115 mg/dL (ref 0–149)
VLDL Cholesterol Cal: 20 mg/dL (ref 5–40)

## 2024-03-13 LAB — CMP14+EGFR
ALT: 19 IU/L (ref 0–32)
AST: 18 IU/L (ref 0–40)
Albumin: 4.7 g/dL (ref 3.8–4.9)
Alkaline Phosphatase: 83 IU/L (ref 44–121)
BUN/Creatinine Ratio: 14 (ref 9–23)
BUN: 13 mg/dL (ref 6–24)
Bilirubin Total: 0.4 mg/dL (ref 0.0–1.2)
CO2: 26 mmol/L (ref 20–29)
Calcium: 10 mg/dL (ref 8.7–10.2)
Chloride: 103 mmol/L (ref 96–106)
Creatinine, Ser: 0.94 mg/dL (ref 0.57–1.00)
Globulin, Total: 2.1 g/dL (ref 1.5–4.5)
Glucose: 118 mg/dL — ABNORMAL HIGH (ref 70–99)
Potassium: 4.2 mmol/L (ref 3.5–5.2)
Sodium: 144 mmol/L (ref 134–144)
Total Protein: 6.8 g/dL (ref 6.0–8.5)
eGFR: 72 mL/min/{1.73_m2} (ref >59)

## 2024-03-13 LAB — TSH: TSH: 1.89 u[IU]/mL (ref 0.450–4.500)

## 2024-03-27 ENCOUNTER — Telehealth: Payer: Self-pay

## 2024-03-27 ENCOUNTER — Other Ambulatory Visit: Payer: Self-pay | Admitting: Family

## 2024-03-27 ENCOUNTER — Other Ambulatory Visit (HOSPITAL_COMMUNITY): Payer: Self-pay

## 2024-03-27 DIAGNOSIS — H9312 Tinnitus, left ear: Secondary | ICD-10-CM

## 2024-03-27 NOTE — Telephone Encounter (Signed)
 Pharmacy Patient Advocate Encounter  Received notification from [facility] that Prior Authorization for JARDIANCE has been APPROVED from 02/26/24 to 03/27/25

## 2024-03-27 NOTE — Telephone Encounter (Signed)
 Pharmacy Patient Advocate Encounter   Received notification from CoverMyMeds that prior authorization for JARDIANCE is required/requested.   Insurance verification completed.   The patient is insured through [facility] .   Per test claim: PA required; PA submitted to above mentioned insurance via CoverMyMeds Key/confirmation #/EOC [id number] Status is pending

## 2024-04-04 ENCOUNTER — Encounter: Payer: Self-pay | Admitting: Internal Medicine

## 2024-05-02 ENCOUNTER — Other Ambulatory Visit: Payer: Self-pay | Admitting: Internal Medicine

## 2024-05-02 ENCOUNTER — Other Ambulatory Visit (HOSPITAL_COMMUNITY): Payer: Self-pay

## 2024-05-02 ENCOUNTER — Telehealth: Payer: Self-pay | Admitting: Pharmacy Technician

## 2024-05-02 NOTE — Telephone Encounter (Signed)
 Pharmacy Patient Advocate Encounter   Received notification from RX Request Messages that prior authorization for pantoprazole  is required/requested.   Insurance verification completed.   The patient is insured through  [employer]  .   Per test claim: PA required; PA submitted to above mentioned insurance via CoverMyMeds Key/confirmation #/EOC [id number] Status is pending

## 2024-05-02 NOTE — Telephone Encounter (Signed)
 Pharmacy Patient Advocate Encounter  Received notification from  federal employee  that Prior Authorization for pantoprazole  has been APPROVED from 04/02/24 to 05/02/25. Ran test claim, Copay is $7.50- one month. This test claim was processed through [facility]- copay amounts may vary at other pharmacies due to pharmacy/plan contracts, or as the patient moves through the different stages of their insurance plan.   PA #/Case ID/Reference #: [id number]

## 2024-05-13 ENCOUNTER — Other Ambulatory Visit: Payer: Self-pay

## 2024-05-13 ENCOUNTER — Encounter (HOSPITAL_COMMUNITY): Payer: Self-pay

## 2024-05-13 ENCOUNTER — Emergency Department (HOSPITAL_COMMUNITY)

## 2024-05-13 ENCOUNTER — Emergency Department (HOSPITAL_COMMUNITY)
Admission: EM | Admit: 2024-05-13 | Discharge: 2024-05-13 | Disposition: A | Discharge status: Home / Self Care | Attending: Emergency Medicine | Admitting: Emergency Medicine

## 2024-05-13 DIAGNOSIS — R0789 Other chest pain: Secondary | ICD-10-CM | POA: Diagnosis not present

## 2024-05-13 DIAGNOSIS — Z7902 Long term (current) use of antithrombotics/antiplatelets: Secondary | ICD-10-CM | POA: Insufficient documentation

## 2024-05-13 DIAGNOSIS — R079 Chest pain, unspecified: Secondary | ICD-10-CM | POA: Diagnosis not present

## 2024-05-13 DIAGNOSIS — R072 Precordial pain: Secondary | ICD-10-CM | POA: Diagnosis not present

## 2024-05-13 LAB — CBC
HCT: 46.2 % — ABNORMAL HIGH (ref 36.0–46.0)
Hemoglobin: 15.4 g/dL — ABNORMAL HIGH (ref 12.0–15.0)
MCH: 30.8 pg (ref 26.0–34.0)
MCHC: 33.3 g/dL (ref 30.0–36.0)
MCV: 92.4 fL (ref 80.0–100.0)
Platelets: 317 10*3/uL (ref 150–400)
RBC: 5 MIL/uL (ref 3.87–5.11)
RDW: 13 % (ref 11.5–15.5)
WBC: 8.4 10*3/uL (ref 4.0–10.5)
nRBC: 0 % (ref 0.0–0.2)

## 2024-05-13 LAB — BASIC METABOLIC PANEL WITH GFR
Anion gap: 11 (ref 5–15)
BUN: 13 mg/dL (ref 6–20)
CO2: 26 mmol/L (ref 22–32)
Calcium: 9.7 mg/dL (ref 8.9–10.3)
Chloride: 103 mmol/L (ref 98–111)
Creatinine, Ser: 0.94 mg/dL (ref 0.44–1.00)
GFR, Estimated: >60 mL/min (ref >60)
Glucose, Bld: 111 mg/dL — ABNORMAL HIGH (ref 70–99)
Potassium: 3.5 mmol/L (ref 3.5–5.1)
Sodium: 140 mmol/L (ref 135–145)

## 2024-05-13 LAB — TROPONIN I (HIGH SENSITIVITY)
Troponin I (High Sensitivity): 3 ng/L (ref <18)
Troponin I (High Sensitivity): 3 ng/L (ref <18)

## 2024-05-13 NOTE — ED Notes (Signed)
 Dr Hyacinth Meeker at bedside.

## 2024-05-13 NOTE — ED Provider Triage Note (Signed)
 Emergency Medicine Provider Triage Evaluation Note  Sarah Fox , a 58 y.o. female  was evaluated in triage.  Pt complains of chest pain.  Pt has had an MI and a pe in the past  Review of Systems  Positive: Chest pain Negative: fever  Physical Exam  BP (!) 141/83   Pulse 74   Temp 97.9 F (36.6 C)   Resp 18   Ht 5\' 5"  (1.651 m)   Wt 65.8 kg   LMP 05/11/2021   SpO2 100%   BMI 24.13 kg/m  Gen:   Awake, no distress   Resp:  Normal effort  MSK:   Moves extremities without difficulty  Other:    Medical Decision Making  Medically screening exam initiated at 2:26 PM.  Appropriate orders placed.  Angeleah Labrake was informed that the remainder of the evaluation will be completed by another provider, this initial triage assessment does not replace that evaluation, and the importance of remaining in the ED until their evaluation is complete.     Sandi Crosby, PA-C 05/13/24 1427

## 2024-05-13 NOTE — ED Notes (Signed)
 ..  The patient is A&OX4, ambulatory at d/c with independent steady gait, NAD. Pt verbalized understanding of d/c instructions and follow up care.

## 2024-05-13 NOTE — ED Provider Notes (Signed)
 [facility] AT [facility] Provider Note   CSN: [id number] Arrival date & time: 05/13/24  1343     History  Chief Complaint  Patient presents with   Chest Pain    Sarah Fox is a 58 y.o. female.   Chest Pain  58 year old female with a prior history of a heart attack about 7 years ago, she has a stent and had been on anticoagulants in the past, she is currently on clopidogrel .  She presents with a complaint of mid chest pain which started about 4 and half hours ago.  She has never had anything like this since her heart attack, she occasionally has pain in the left side of her chest and takes nitro for it, today when the pain hit she went to get her nitroglycerin  but because the pain is started to ease off she did not take it.  The pain was a pressure, middle of the chest, no radiation, no shortness of breath, no nausea, mild diaphoresis.  This has since completely resolved and she is asymptomatic.  This came out while she was laying on the bed.  It then came on again a short time later while she was lying on the bed, she does not have any exertional symptoms and recently has not had any exertional symptoms.  No swelling of the legs    Home Medications Prior to Admission medications   Medication Sig Start Date End Date Taking? Authorizing Provider  atorvastatin  (LIPITOR ) 40 MG tablet TAKE 1 TABLET BY MOUTH EVERY DAY 02/24/24   Thukkani, Arun K, MD  baclofen  (LIORESAL ) 10 MG tablet Take 1 tablet (10 mg total) by mouth 3 (three) times daily. 02/10/24   Dettinger, Lucio Sabin, MD  clopidogrel  (PLAVIX ) 75 MG tablet TAKE 1 TABLET BY MOUTH EVERY DAY 02/24/24   Thukkani, Arun K, MD  empagliflozin  (JARDIANCE ) 10 MG TABS tablet Take 1 tablet (10 mg total) by mouth daily. 03/13/24   Thukkani, Arun K, MD  escitalopram  (LEXAPRO ) 20 MG tablet Take 1 tablet (20 mg total) by mouth daily. 09/12/23   Yevette Hem, FNP  fluticasone  (FLONASE ) 50 MCG/ACT nasal spray SPRAY 2  SPRAYS INTO EACH NOSTRIL EVERY DAY 03/27/24   Tommas Fragmin A, FNP  levothyroxine  (SYNTHROID ) 88 MCG tablet TAKE 1 TABLET BY MOUTH EVERY DAY 03/12/24   Hawks, Kathaleen Pale A, FNP  losartan  (COZAAR ) 25 MG tablet TAKE 1 TABLET (25 MG TOTAL) BY MOUTH DAILY. 03/13/24   Thukkani, Arun K, MD  metoprolol  succinate (TOPROL -XL) 25 MG 24 hr tablet TAKE 1 TABLET (25 MG TOTAL) BY MOUTH DAILY. 02/24/24   Thukkani, Arun K, MD  nitroGLYCERIN  (NITROSTAT ) 0.4 MG SL tablet Place 1 tablet (0.4 mg total) under the tongue every 5 (five) minutes as needed for chest pain. 02/15/20   Arty Binning, MD  pantoprazole  (PROTONIX ) 40 MG tablet TAKE 1 TABLET BY MOUTH TWICE A DAY 05/02/24   Thukkani, Arun K, MD      Allergies    Sulfa  antibiotics    Review of Systems   Review of Systems  Cardiovascular:  Positive for chest pain.  All other systems reviewed and are negative.   Physical Exam Updated Vital Signs BP (!) 141/83   Pulse 74   Temp 97.9 F (36.6 C)   Resp 18   Ht 1.651 m (5\' 5" )   Wt 65.8 kg   LMP 05/11/2021   SpO2 100%   BMI 24.13 kg/m  Physical Exam Vitals and nursing  note reviewed.  Constitutional:      General: She is not in acute distress.    Appearance: She is well-developed.  HENT:     Head: Normocephalic and atraumatic.     Mouth/Throat:     Pharynx: No oropharyngeal exudate.  Eyes:     General: No scleral icterus.       Right eye: No discharge.        Left eye: No discharge.     Conjunctiva/sclera: Conjunctivae normal.     Pupils: Pupils are equal, round, and reactive to light.  Neck:     Thyroid : No thyromegaly.     Vascular: No JVD.  Cardiovascular:     Rate and Rhythm: Normal rate and regular rhythm.     Heart sounds: Normal heart sounds. No murmur heard.    No friction rub. No gallop.  Pulmonary:     Effort: Pulmonary effort is normal. No respiratory distress.     Breath sounds: Normal breath sounds. No wheezing or rales.  Abdominal:     General: Bowel sounds are normal. There is  no distension.     Palpations: Abdomen is soft. There is no mass.     Tenderness: There is no abdominal tenderness.  Musculoskeletal:        General: No tenderness. Normal range of motion.     Cervical back: Normal range of motion and neck supple.     Right lower leg: No edema.     Left lower leg: No edema.  Lymphadenopathy:     Cervical: No cervical adenopathy.  Skin:    General: Skin is warm and dry.     Findings: No erythema or rash.  Neurological:     Mental Status: She is alert.     Coordination: Coordination normal.  Psychiatric:        Behavior: Behavior normal.     ED Results / Procedures / Treatments   Labs (all labs ordered are listed, but only abnormal results are displayed) Labs Reviewed  BASIC METABOLIC PANEL WITH GFR - Abnormal; Notable for the following components:      Result Value   Glucose, Bld 111 (*)    All other components within normal limits  CBC - Abnormal; Notable for the following components:   Hemoglobin 15.4 (*)    HCT 46.2 (*)    All other components within normal limits  TROPONIN I (HIGH SENSITIVITY)  TROPONIN I (HIGH SENSITIVITY)    EKG EKG Interpretation Date/Time:  Sunday May 13 2024 13:54:48 EDT Ventricular Rate:  70 PR Interval:  156 QRS Duration:  72 QT Interval:  366 QTC Calculation: 395 R Axis:   52  Text Interpretation: Normal sinus rhythm Septal infarct , age undetermined Abnormal ECG When compared with ECG of 20-Feb-2024 13:26, PREVIOUS ECG IS PRESENT Confirmed by Early Glisson ([id number]) on 05/13/2024 4:20:44 PM   EKG Interpretation Date/Time:  Sunday May 13 2024 17:29:12 EDT Ventricular Rate:  55 PR Interval:  160 QRS Duration:  78 QT Interval:  440 QTC Calculation: 420 R Axis:   27  Text Interpretation: Sinus bradycardia Otherwise normal ECG When compared with ECG of 13-May-2024 13:54, PREVIOUS ECG IS PRESENT no changes Confirmed by Early Glisson ([id number]) on 05/13/2024 5:32:30 PM         Radiology DG Chest 2  View Result Date: 05/13/2024 CLINICAL DATA:  CP EXAM: CHEST - 2 VIEW COMPARISON:  January 27, 2020 FINDINGS: The cardiomediastinal silhouette is unchanged in contour. No pleural effusion. No pneumothorax.  No acute pleuroparenchymal abnormality. Radiopaque densities overlying the RIGHT upper quadrant may reflect cholelithiasis. Mild degenerative changes of the thoracic spine. IMPRESSION: No acute cardiopulmonary abnormality. Electronically Signed   By: Clancy Crimes M.D.   On: 05/13/2024 15:00    Procedures Procedures    Medications Ordered in ED Medications - No data to display  ED Course/ Medical Decision Making/ A&P                                 Medical Decision Making Amount and/or Complexity of Data Reviewed Labs: ordered. Radiology: ordered. ECG/medicine tests: ordered.    This patient presents to the ED for concern of chest pain, this involves an extensive number of treatment options, and is a complaint that carries with it a high risk of complications and morbidity.  The differential diagnosis includes acute coronary syndrome, less likely to be pulmonary embolism, less likely to be aortic dissection, could be anxiety related, it is transient and she is currently chest pain-free   Co morbidities that complicate the patient evaluation  Prior history of acute coronary syndrome   Additional history obtained:  Additional history obtained from medical record External records from outside source obtained and reviewed including left heart catheterization from 2018 which showed 99% thrombus in the proximal LAD.  She went into V-fib during that episode, she was successfully resuscitated. Most recent echocardiogram was June 2024 which showed ejection fraction of 50 to 55%, there was some left ventricular wall motion abnormalities, right ventricular systolic function was normal tricuspid regurgitation is mild to moderate   Lab Tests:  I Ordered, and personally interpreted  labs.  The pertinent results include: Totally normal troponin, repeat troponin was also totally normal, there is no delta change, CBC and metabolic panel are reassuring   Imaging Studies ordered:  I ordered imaging studies including 2 view chest x-ray PA and lateral views I independently visualized and interpreted imaging which showed no signs of acute abnormalities especially no infiltrates pneumothorax or abnormal mediastinum I agree with the radiologist interpretation   Cardiac Monitoring: / EKG:  The patient was maintained on a cardiac monitor.  I personally viewed and interpreted the cardiac monitored which showed an underlying rhythm of: Normal sinus rhythm.  Initial EKG was totally normal, repeat EKG was also unremarkable   Problem List / ED Course / Critical interventions / Medication management  The patient has been chest pain-free since arrival with vital signs that are reassuring, 2 negative troponins and EKGs that are unremarkable.  I feel like she can follow-up in the outpatient setting.  She certainly had some atypical type discomfort in her chest, she even states that it is completely resolved and not exertional.  I feel comfortable letting the patient go home and she is comfortable with the plan as long as she comes back if it gets worst Already on clopidogrel  I have reviewed the patients home medicines and have made adjustments as needed   Consultations Obtained:  Outpatient cardiology follow-up  Social Determinants of Health:  Prior myocardial infarction and stenting   Test / Admission - Considered:  Admission but the patient is symptom-free and has a negative workup, she is agreeable to the plan   I have discussed with the patient at the bedside the results, and the meaning of these results.  They have had opportunity to ask questions,  expressed their understanding to the need for follow-up with primary care physician  Final Clinical  Impression(s) / ED Diagnoses Final diagnoses:  Midsternal chest pain    Rx / DC Orders ED Discharge Orders     None         Early Glisson, MD 05/13/24 2317

## 2024-05-13 NOTE — ED Triage Notes (Signed)
 Pt came in via POV d/t central CP a few times that she had not too long before arrival. Has chronic Lt shoulder pain but no other location of pain radiation. Pt reports that she had her Nitroglycerin  ready then the pain went away & she never took any. A/Ox4, denies pain during triage but rated it 7/10 when it happened.

## 2024-05-13 NOTE — Discharge Instructions (Signed)
 Your testing was normal No signs of heart attack Your vital signs are reassuring -  Your blood work was normal and your EKG was good too. See your cardiologist in the next week if you have any recurrent symptoms but return to the ER immediately for any severe or worsening symptoms.

## 2024-06-01 ENCOUNTER — Ambulatory Visit: Payer: Self-pay | Admitting: *Deleted

## 2024-06-01 NOTE — Telephone Encounter (Signed)
 Pt called in c/o neck and shoulder pain from a fall 3 weeks ago.  Mentioned she was seen in ED 05/13/2024 for chest pain.   Has not followed up. No appts available with Tommas Fragmin, FNP soon enough.   Appt needed for hospital f/u and pain.  FYI Only or Action Required?: Action required by provider  Patient was last seen in primary care on 03/12/2024 by Yevette Hem, FNP. Called Nurse Triage reporting Shoulder Pain. Symptoms began several weeks ago. Interventions attempted: OTC medications: Naproxen  . Symptoms are: gradually worsening.  Triage Disposition: See PCP When Office is Open (Within 3 Days)  Patient/caregiver understands and will follow disposition?:  Yes       Copied from CRM [id number]. Topic: Clinical - Red Word Triage >> Jun 01, 2024  9:10 AM Everlene Hobby D wrote: Right side of neck, under chin, behind ear and in front of neck, the collar bone and shoulder pain for 3 weeks. 2 weeks prior she was playing with grandson and slid and fell on wet grass and it took 2 weeks before the signs started to show. Reason for Disposition  [1] MODERATE pain (e.g., interferes with normal activities) AND [2] present > 3 days  Answer Assessment - Initial Assessment Questions 1. ONSET: "When did the pain start?"     3 weeks it's been hurting.   I slid and fell on wet grass.   It didn't start hurting until 2 weeks later the pain started. 2. LOCATION: "Where is the pain located?"     On right side of my jaw, under my ear and into the front of my neck and my collar bone and into the back shoulder.   3. PAIN: "How bad is the pain?" (Scale 1-10; or mild, moderate, severe)   - MILD (1-3): doesn't interfere with normal activities   - MODERATE (4-7): interferes with normal activities (e.g., work or school) or awakens from sleep   - SEVERE (8-10): excruciating pain, unable to do any normal activities, unable to move arm at all due to pain     Today 4/10 pain.   I can move it.   Certain thing I do it hurts.    4. WORK OR EXERCISE: "Has there been any recent work or exercise that involved this part of the body?"     I fell 3 weeks ago now on wet grass and hurt my neck and all. 5. CAUSE: "What do you think is causing the shoulder pain?"     The fall 6. OTHER SYMPTOMS: "Do you have any other symptoms?" (e.g., neck pain, swelling, rash, fever, numbness, weakness)     See above I'm taking Naproxen  but it's not helping 7. PREGNANCY: "Is there any chance you are pregnant?" "When was your last menstrual period?"     Not asked due to age  Protocols used: Shoulder Pain-A-AH

## 2024-06-01 NOTE — Telephone Encounter (Signed)
 Called and spoke with patient. Appt scheduled for Monday with PCP per patients request. Advised patient if pain worsens over the weekend to go to ER. Patient verbalized understanding

## 2024-06-04 ENCOUNTER — Ambulatory Visit (INDEPENDENT_AMBULATORY_CARE_PROVIDER_SITE_OTHER)

## 2024-06-04 ENCOUNTER — Ambulatory Visit: Admitting: Family

## 2024-06-04 ENCOUNTER — Encounter: Payer: Self-pay | Admitting: Family

## 2024-06-04 VITALS — BP 124/77 | HR 68 | Temp 97.1°F | Ht 65.0 in | Wt 147.8 lb

## 2024-06-04 DIAGNOSIS — M47812 Spondylosis without myelopathy or radiculopathy, cervical region: Secondary | ICD-10-CM | POA: Diagnosis not present

## 2024-06-04 DIAGNOSIS — M542 Cervicalgia: Secondary | ICD-10-CM

## 2024-06-04 DIAGNOSIS — S161XXA Strain of muscle, fascia and tendon at neck level, initial encounter: Secondary | ICD-10-CM

## 2024-06-04 DIAGNOSIS — W19XXXA Unspecified fall, initial encounter: Secondary | ICD-10-CM

## 2024-06-04 MED ORDER — PREDNISONE 20 MG PO TABS
40.0000 mg | ORAL_TABLET | Freq: Every day | ORAL | 0 refills | Status: AC
Start: 2024-06-04 — End: 2024-06-09

## 2024-06-04 MED ORDER — DICLOFENAC SODIUM 75 MG PO TBEC: 75.0000 mg | DELAYED_RELEASE_TABLET | Freq: Two times a day (BID) | ORAL | 2 refills | Status: AC

## 2024-06-04 MED ORDER — BACLOFEN 10 MG PO TABS: 10.0000 mg | ORAL_TABLET | Freq: Three times a day (TID) | ORAL | 0 refills | Status: AC

## 2024-06-04 NOTE — Patient Instructions (Signed)
 Cervical Sprain A cervical sprain is a stretch or tear in one or more of the ligaments in the neck. Ligaments are the tissues that connect bones to each other. Cervical sprains can range from mild to severe. Severe cervical sprains can cause the spinal bones (vertebrae) in the neck to be unstable. This can result in spinal cord damage and serious nervous system problems. Healing time for a cervical sprain depends on the cause and extent of the injury. Most cervical sprains heal in 4-6 weeks. What are the causes? Cervical sprains may be caused by trauma, such as an injury from a motor vehicle accident, a fall, or a sudden forward and backward whipping movement of the head and neck (whiplash injury). Mild cervical sprains may be caused by wear and tear over time. What increases the risk? You are more likely to get a cervical sprain if: You take part in activities that have a high risk of trauma to the neck. These include contact sports, gymnastics, and diving. You have: Osteoarthritis of the spine. Poor strength and flexibility of the neck. Poor posture. You have had a neck injury in the past. You spend long periods in positions that put stress on the neck, such as sitting at a computer. What are the signs or symptoms? Symptoms of this condition include: Any of these problems in the neck, shoulders, or upper back: Pain or tenderness. Stiffness. Swelling. A burning feeling. Sudden tightening of neck muscles (spasms). Limited ability to move the neck. Headache. Dizziness. Nausea or vomiting. Weakness, numbness, or tingling in a hand or an arm. Symptoms may develop right away after injury or may develop over a few days. In some cases, symptoms may go away with treatment and return (recur) over time. How is this diagnosed? This condition may be diagnosed based on: Your symptoms, medical history, and a physical exam. Any recent injuries or known neck problems that you have, such as arthritis  in the neck. Imaging tests, such as X-rays, an MRI, or a CT scan. How is this treated? This condition is treated by resting and icing the injured area and doing physical therapy exercises to improve movement and strength. Heat therapy may be used 2-3 days after the injury if there is no swelling. Depending on the severity of your condition, treatment may also include: Keeping your neck in place (immobilized) for periods of time. This may be done using: A cervical collar. This supports your chin and the back of your head. A cervical traction device. This is a sling that holds up your head. It removes weight and pressure from your neck. Medicines for pain or other symptoms. Surgery. This is rare. Follow these instructions at home: Medicines Take over-the-counter and prescription medicines only as told by your health care provider. Ask your provider if the medicine prescribed to you: Requires you to avoid driving or using machinery. Can cause constipation. You may need to take these actions to prevent or treat constipation: Drink enough fluid to keep your pee pale yellow. Take over-the-counter or prescription medicines. Eat foods that are high in fiber, such as beans, whole grains, and fresh fruits and vegetables. Limit foods that are high in fat and processed sugars, such as fried or sweet foods. If you have a cervical collar: Wear the collar as told by your provider. Do not remove it unless told. Ask before making any adjustments to your collar. If you have long hair, keep it outside of the collar. If you are allowed to remove the  collar for cleaning and bathing: Follow instructions about how to remove it safely. Clean it by hand with mild soap and water and air-dry it completely. If your collar has removable pads, remove them every 1-2 days and wash them by hand with soap and water. Let them air-dry completely before putting them back in the collar. Tell your provider if your skin under  the collar has irritation or sores. Managing pain, stiffness, and swelling     Use a cervical traction device as told. If told, put ice on the affected area. Put ice in a plastic bag. Place a towel between your skin and the bag. Leave the ice on for 20 minutes, 2-3 times a day. If told, apply heat to the affected area before you exercise or as often as told by your provider. Use the heat source that your provider recommends, such as a moist heat pack or a heating pad. Place a towel between your skin and the heat source. Leave the heat on for 20-30 minutes. If your skin turns bright red, remove the ice or heat right away to prevent skin damage. The risk of damage is higher if you cannot feel pain, heat, or cold. Activity Do not drive while wearing a cervical collar. If you do not have a cervical collar, ask if it is safe to drive while your neck heals. Do not lift anything that is heavier than 10 lb (4.5 kg) until your provider says that it is safe. Rest as told by your provider. Avoid positions and activities that make your symptoms worse. Do physical therapy exercises as told by your provider or physical therapist. Return to your normal activities as told by your provider. Ask your provider what activities are safe for you. General instructions Do not use any products that contain nicotine or tobacco. These products include cigarettes, chewing tobacco, and vaping devices, such as e-cigarettes. These can delay healing. If you need help quitting, ask your provider. Keep all follow-up visits. Your provider will monitor your injury and activity level. How is this prevented? To prevent a cervical sprain from happening again: Use and maintain good posture. Make any needed adjustments to your workstation to help you do this. Exercise regularly as told by your provider or physical therapist. Avoid risky activities that may cause a cervical sprain. Contact a health care provider if: You have  symptoms that get worse or do not get better after 2 weeks of treatment. You have new symptoms. Your pain gets worse or does not get better with medicine. You have sores or irritated skin on your neck from wearing your cervical collar. Get help right away if: You have severe pain. You develop numbness, tingling, or weakness in any part of your body. You cannot move a part of your body (you have paralysis). You have neck pain along with severe dizziness or headache. This information is not intended to replace advice given to you by your health care provider. Make sure you discuss any questions you have with your health care provider. Document Revised: 07/16/2022 Document Reviewed: 07/16/2022 Elsevier Patient Education  2024 [employer].

## 2024-06-04 NOTE — Progress Notes (Signed)
 Subjective:    Patient ID: Sarah Fox, female    DOB: [birth year], 58 y.o.   MRN: [id number]  Chief Complaint  Patient presents with   Neck Pain    Goes to shoulder  up head. X 2 week ago felt pulls sting    PT presents to the office today with with neck for 2-3 weeks ago with right neck pain that started after running and falling down while playing with her grandson.  Neck Pain  This is a new problem. The current episode started 1 to 4 weeks ago. The problem occurs constantly. The problem has been gradually improving. The pain is associated with a fall. The pain is present in the right side. The quality of the pain is described as aching. The pain is at a severity of 6/10. The pain is mild. The symptoms are aggravated by twisting. Pertinent negatives include no photophobia, trouble swallowing or visual change. She has tried bed rest and NSAIDs for the symptoms. The treatment provided mild relief.      Review of Systems  HENT:  Negative for trouble swallowing.   Eyes:  Negative for photophobia.  Musculoskeletal:  Positive for neck pain.  All other systems reviewed and are negative.   Social History   Socioeconomic History   Marital status: Married    Spouse name: Not on file   Number of children: 1   Years of education: Not on file   Highest education level: Not on file  Occupational History   Occupation: Mail carrier    Employer: [employer]  Tobacco Use   Smoking status: Former    Current packs/day: 0.00    Average packs/day: 0.5 packs/day for 20.0 years (10.0 ttl pk-yrs)    Types: Cigarettes    Start date: 11/28/1997    Quit date: 11/28/2017    Years since quitting: 6.5   Smokeless tobacco: Never  Vaping Use   Vaping status: Never Used  Substance and Sexual Activity   Alcohol use: Yes    Alcohol/week: 28.0 standard drinks of alcohol    Types: 28 Cans of beer per week    Comment: 4 16 oz beers nightly   Drug use: No   Sexual activity: Yes    Comment: ptp  states she cannot be pregnant, but not using anything to not get prenant  Other Topics Concern   Not on file  Social History Narrative   Married with one child.   Social Drivers of Corporate investment banker Strain: Not on file  Food Insecurity: Not on file  Transportation Needs: Not on file  Physical Activity: Not on file  Stress: Stress Concern Present (11/29/2017)   [employer] of Occupational Health - Occupational Stress Questionnaire    Feeling of Stress : Very much  Social Connections: Not on file   Family History  Problem Relation Age of Onset   Breast cancer Mother 30   Other Father    Breast cancer Sister 68   Breast cancer Cousin 44 - 5       34s was a 2nd ocurrence   Heart disease Brother    Breast cancer Other    Breast cancer Other    Colon cancer Neg Hx    Esophageal cancer Neg Hx    Rectal cancer Neg Hx    Stomach cancer Neg Hx         Objective:   Physical Exam Vitals reviewed.  Constitutional:      General:  She is not in acute distress.    Appearance: She is well-developed.  HENT:     Head: Normocephalic and atraumatic.  Eyes:     Pupils: Pupils are equal, round, and reactive to light.  Neck:     Thyroid : No thyromegaly.  Cardiovascular:     Rate and Rhythm: Normal rate and regular rhythm.     Heart sounds: Normal heart sounds. No murmur heard. Pulmonary:     Effort: Pulmonary effort is normal. No respiratory distress.     Breath sounds: Normal breath sounds. No wheezing.  Abdominal:     General: Bowel sounds are normal. There is no distension.     Palpations: Abdomen is soft.     Tenderness: There is no abdominal tenderness.  Musculoskeletal:        General: Tenderness present.     Cervical back: Normal range of motion and neck supple.     Comments: Pain in right neck with flexion  Skin:    General: Skin is warm and dry.  Neurological:     Mental Status: She is alert and oriented to person, place, and time.     Cranial Nerves:  No cranial nerve deficit.     Deep Tendon Reflexes: Reflexes are normal and symmetric.  Psychiatric:        Behavior: Behavior normal.        Thought Content: Thought content normal.        Judgment: Judgment normal.       BP 124/77   Pulse 68   Temp (!) 97.1 F (36.2 C) (Temporal)   Ht 5\' 5"  (1.651 m)   Wt 147 lb 12.8 oz (67 kg)   LMP 05/11/2021   BMI 24.60 kg/m      Assessment & Plan:  Sarah Fox comes in today with chief complaint of Neck Pain (Goes to shoulder  up head. X 2 week ago felt pulls sting )   Diagnosis and orders addressed:  1. Neck pain (Primary) - diclofenac (VOLTAREN) 75 MG EC tablet; Take 1 tablet (75 mg total) by mouth 2 (two) times daily.  Dispense: 60 tablet; Refill: 2 - baclofen  (LIORESAL ) 10 MG tablet; Take 1 tablet (10 mg total) by mouth 3 (three) times daily.  Dispense: 30 each; Refill: 0 - predniSONE  (DELTASONE ) 20 MG tablet; Take 2 tablets (40 mg total) by mouth daily with breakfast for 5 days.  Dispense: 10 tablet; Refill: 0 - DG Cervical Spine Complete; Future  2. Fall, initial encounter  3. Strain of neck muscle, initial encounter - diclofenac (VOLTAREN) 75 MG EC tablet; Take 1 tablet (75 mg total) by mouth 2 (two) times daily.  Dispense: 60 tablet; Refill: 2 - baclofen  (LIORESAL ) 10 MG tablet; Take 1 tablet (10 mg total) by mouth 3 (three) times daily.  Dispense: 30 each; Refill: 0 - predniSONE  (DELTASONE ) 20 MG tablet; Take 2 tablets (40 mg total) by mouth daily with breakfast for 5 days.  Dispense: 10 tablet; Refill: 0   Ice ROM exercises  Start diclofenac BID with food, no other NSAID's  Baclofen  as needed, sedation precautions  Prednisone  daily for 5 days Follow up if symptoms worsen or do not improve     Tommas Fragmin, FNP

## 2024-06-08 ENCOUNTER — Ambulatory Visit: Payer: Self-pay | Admitting: Family

## 2024-06-08 NOTE — Telephone Encounter (Signed)
 Patient returned call and notified of results- no further questions at this time.

## 2024-08-03 ENCOUNTER — Other Ambulatory Visit: Payer: Self-pay | Admitting: Family

## 2024-08-03 DIAGNOSIS — F321 Major depressive disorder, single episode, moderate: Secondary | ICD-10-CM

## 2024-08-03 DIAGNOSIS — F411 Generalized anxiety disorder: Secondary | ICD-10-CM

## 2024-08-11 DIAGNOSIS — I10 Essential (primary) hypertension: Secondary | ICD-10-CM | POA: Diagnosis not present

## 2024-08-11 DIAGNOSIS — M25521 Pain in right elbow: Secondary | ICD-10-CM | POA: Diagnosis not present

## 2024-08-11 DIAGNOSIS — S51011A Laceration without foreign body of right elbow, initial encounter: Secondary | ICD-10-CM | POA: Diagnosis not present

## 2024-08-11 DIAGNOSIS — Z79899 Other long term (current) drug therapy: Secondary | ICD-10-CM | POA: Diagnosis not present

## 2024-08-11 DIAGNOSIS — S59901A Unspecified injury of right elbow, initial encounter: Secondary | ICD-10-CM | POA: Diagnosis not present

## 2024-08-11 DIAGNOSIS — S80212A Abrasion, left knee, initial encounter: Secondary | ICD-10-CM | POA: Diagnosis not present

## 2024-08-11 DIAGNOSIS — S60511A Abrasion of right hand, initial encounter: Secondary | ICD-10-CM | POA: Diagnosis not present

## 2024-08-11 DIAGNOSIS — Z87891 Personal history of nicotine dependence: Secondary | ICD-10-CM | POA: Diagnosis not present

## 2024-08-11 DIAGNOSIS — Z7982 Long term (current) use of aspirin: Secondary | ICD-10-CM | POA: Diagnosis not present

## 2024-08-11 DIAGNOSIS — Z79890 Hormone replacement therapy (postmenopausal): Secondary | ICD-10-CM | POA: Diagnosis not present

## 2024-08-11 DIAGNOSIS — S80211A Abrasion, right knee, initial encounter: Secondary | ICD-10-CM | POA: Diagnosis not present

## 2024-08-11 DIAGNOSIS — S6991XA Unspecified injury of right wrist, hand and finger(s), initial encounter: Secondary | ICD-10-CM | POA: Diagnosis not present

## 2024-08-11 DIAGNOSIS — W010XXA Fall on same level from slipping, tripping and stumbling without subsequent striking against object, initial encounter: Secondary | ICD-10-CM | POA: Diagnosis not present

## 2024-08-11 DIAGNOSIS — M25532 Pain in left wrist: Secondary | ICD-10-CM | POA: Diagnosis not present

## 2024-08-11 DIAGNOSIS — E785 Hyperlipidemia, unspecified: Secondary | ICD-10-CM | POA: Diagnosis not present

## 2024-08-11 DIAGNOSIS — S60512A Abrasion of left hand, initial encounter: Secondary | ICD-10-CM | POA: Diagnosis not present

## 2024-08-11 DIAGNOSIS — M25562 Pain in left knee: Secondary | ICD-10-CM | POA: Diagnosis not present

## 2024-08-11 DIAGNOSIS — S60416A Abrasion of right little finger, initial encounter: Secondary | ICD-10-CM | POA: Diagnosis not present

## 2024-08-14 ENCOUNTER — Encounter: Payer: Self-pay | Admitting: Nurse Practitioner

## 2024-08-14 ENCOUNTER — Inpatient Hospital Stay: Admitting: Family Medicine

## 2024-08-14 ENCOUNTER — Ambulatory Visit: Admitting: Nurse Practitioner

## 2024-08-14 VITALS — BP 123/75 | HR 63 | Temp 97.5°F | Ht 65.0 in | Wt 152.2 lb

## 2024-08-14 DIAGNOSIS — M25532 Pain in left wrist: Secondary | ICD-10-CM | POA: Diagnosis not present

## 2024-08-14 DIAGNOSIS — S51011D Laceration without foreign body of right elbow, subsequent encounter: Secondary | ICD-10-CM

## 2024-08-14 DIAGNOSIS — S51011A Laceration without foreign body of right elbow, initial encounter: Secondary | ICD-10-CM | POA: Insufficient documentation

## 2024-08-14 DIAGNOSIS — W19XXXD Unspecified fall, subsequent encounter: Secondary | ICD-10-CM | POA: Diagnosis not present

## 2024-08-14 DIAGNOSIS — W19XXXA Unspecified fall, initial encounter: Secondary | ICD-10-CM | POA: Insufficient documentation

## 2024-08-14 NOTE — Progress Notes (Signed)
 Acute Office Visit  Subjective:     Patient ID: Sarah Fox, female    DOB: 03/10/1966, 58 y.o.   MRN: [id number]  Chief Complaint  Patient presents with   Follow-up    Had a fall, had to get stitches on elbow needs to make sure not infected    HPI Sarah Fox is a 58 year old female who presents on 08/14/2024 for evaluation following a fall while chasing her 32-year-old on Saturday. She went to the ER after sustaining a laceration to her right elbow, where 4 stitches were placed due to excessive bleeding. She reports that she has pulled out one of the stitches and is concerned about possible infection. She is currently on blood-thinning medication. She is also experiencing pain in her left arm from a sprain sustained during the fall and requests an extension of her work note. Active Ambulatory Problems    Diagnosis Date Noted   Ulcer-like dyspepsia 04/04/2012   Former heavy tobacco smoker 04/04/2012   CAD in native artery    VF (ventricular fibrillation) (HCC)    Ischemic cardiomyopathy 10/27/2017   GERD (gastroesophageal reflux disease)    Coronary artery disease    Chronic combined systolic and diastolic heart failure (HCC) 08/14/2018   Hyperlipidemia LDL goal <70 08/14/2018   Hypothyroidism 08/29/2018   GAD (generalized anxiety disorder) 10/30/2018   Osteoarthritis 05/11/2019   Depression, major, single episode, moderate (HCC) 06/12/2020   Atypical angina (HCC) 02/14/2021   LV (left ventricular) mural thrombus 02/16/2021   History of ST elevation myocardial infarction (STEMI) 03/11/2023   Diarrhea 12/22/2023   Laceration of right elbow 08/14/2024   Acute wrist pain, left 08/14/2024   Fall 08/14/2024   Resolved Ambulatory Problems    Diagnosis Date Noted   Right elbow pain 01/24/2017   Left shoulder pain 09/29/2017   Acute ST elevation myocardial infarction (STEMI) of anterior wall (HCC)    Acute ST elevation myocardial infarction (STEMI) involving left anterior  descending (LAD) coronary artery (HCC) 11/29/2017   Myocardial infarction acute (HCC) 10/27/2017   Cardiac arrest with ventricular fibrillation (HCC) 10/27/2017   Allergy    Chest pain at rest 08/24/2018   Near syncope 02/16/2021   Encounter for therapeutic drug monitoring 02/19/2021   Past Medical History:  Diagnosis Date   Thyroid  disease     Review of Systems  Constitutional:  Negative for chills and fever.  Cardiovascular:  Negative for chest pain and leg swelling.  Gastrointestinal:  Negative for blood in stool, constipation, diarrhea, nausea and vomiting.  Musculoskeletal:  Positive for falls.       Left arm pain d/t sprain  Skin:  Negative for itching and rash.  Neurological:  Negative for dizziness and headaches.   Negative unless indicated in HPI    Objective:    BP 123/75   Pulse 63   Temp (!) 97.5 F (36.4 C) (Temporal)   Ht 5' 5 (1.651 m)   Wt 152 lb 3.2 oz (69 kg)   LMP 05/11/2021   SpO2 98%   BMI 25.33 kg/m  BP Readings from Last 3 Encounters:  08/14/24 123/75  06/04/24 124/77  05/13/24 120/77   Wt Readings from Last 3 Encounters:  08/14/24 152 lb 3.2 oz (69 kg)  06/04/24 147 lb 12.8 oz (67 kg)  05/13/24 145 lb (65.8 kg)      Physical Exam Vitals and nursing note reviewed.  Constitutional:      General: She is not in acute distress. HENT:  Head: Normocephalic and atraumatic.  Eyes:     General: No scleral icterus.    Extraocular Movements: Extraocular movements intact.     Conjunctiva/sclera: Conjunctivae normal.     Pupils: Pupils are equal, round, and reactive to light.  Cardiovascular:     Heart sounds: Normal heart sounds.  Pulmonary:     Effort: Pulmonary effort is normal.     Breath sounds: Normal breath sounds.  Musculoskeletal:        General: Normal range of motion.     Right lower leg: No edema.     Left lower leg: No edema.  Skin:    Comments: Abrasion right arm with 4 stiches  Neurological:     Mental Status: She is  alert and oriented to person, place, and time.  Psychiatric:        Mood and Affect: Mood normal.        Behavior: Behavior normal.        Thought Content: Thought content normal.        Judgment: Judgment normal.    Pertinent labs & imaging results that were available during my care of the patient were reviewed by me and considered in my medical decision making.  No results found for any visits on 08/14/24.      Assessment & Plan:  Fall, initial encounter  Laceration of right elbow, initial encounter  Acute wrist pain, left   Sarah Fox  is 58 yrs old female seen fro wound check post hospital follow  Right elbow laceration, wound dressing , bacitacrin applied, Work noted extended to 08/18/2024  Continue wearing brace on left  The above assessment and management plan was discussed with the patient. The patient verbalized understanding of and has agreed to the management plan. Patient is aware to call the clinic if they develop any new symptoms or if symptoms persist or worsen. Patient is aware when to return to the clinic for a follow-up visit. Patient educated on when it is appropriate to go to the emergency department.  Return if symptoms worsen or fail to improve.  Orvill Coulthard [place] Thompson, DNP [facility] [address] [place], [place] [address] [phone]  Note: This document was prepared by [employer] voice dictation technology and any errors that results from this process are unintentional.

## 2024-08-28 ENCOUNTER — Telehealth: Payer: Self-pay

## 2024-08-28 ENCOUNTER — Ambulatory Visit: Admitting: Family

## 2024-08-28 ENCOUNTER — Encounter: Payer: Self-pay | Admitting: Family

## 2024-08-28 ENCOUNTER — Ambulatory Visit: Payer: Self-pay | Admitting: Family

## 2024-08-28 ENCOUNTER — Ambulatory Visit (INDEPENDENT_AMBULATORY_CARE_PROVIDER_SITE_OTHER)

## 2024-08-28 VITALS — BP 136/73 | HR 71 | Temp 97.6°F | Ht 65.0 in | Wt 151.8 lb

## 2024-08-28 DIAGNOSIS — E039 Hypothyroidism, unspecified: Secondary | ICD-10-CM | POA: Diagnosis not present

## 2024-08-28 DIAGNOSIS — I2510 Atherosclerotic heart disease of native coronary artery without angina pectoris: Secondary | ICD-10-CM

## 2024-08-28 DIAGNOSIS — Z4802 Encounter for removal of sutures: Secondary | ICD-10-CM

## 2024-08-28 DIAGNOSIS — F321 Major depressive disorder, single episode, moderate: Secondary | ICD-10-CM | POA: Diagnosis not present

## 2024-08-28 DIAGNOSIS — F411 Generalized anxiety disorder: Secondary | ICD-10-CM

## 2024-08-28 DIAGNOSIS — L03115 Cellulitis of right lower limb: Secondary | ICD-10-CM

## 2024-08-28 DIAGNOSIS — M79642 Pain in left hand: Secondary | ICD-10-CM | POA: Diagnosis not present

## 2024-08-28 DIAGNOSIS — E785 Hyperlipidemia, unspecified: Secondary | ICD-10-CM

## 2024-08-28 DIAGNOSIS — M1612 Unilateral primary osteoarthritis, left hip: Secondary | ICD-10-CM

## 2024-08-28 DIAGNOSIS — K219 Gastro-esophageal reflux disease without esophagitis: Secondary | ICD-10-CM

## 2024-08-28 DIAGNOSIS — I2583 Coronary atherosclerosis due to lipid rich plaque: Secondary | ICD-10-CM

## 2024-08-28 DIAGNOSIS — I252 Old myocardial infarction: Secondary | ICD-10-CM

## 2024-08-28 MED ORDER — DOXYCYCLINE HYCLATE 100 MG PO TABS: 100.0000 mg | ORAL_TABLET | Freq: Two times a day (BID) | ORAL | 0 refills | Status: AC

## 2024-08-28 MED ORDER — LEVOTHYROXINE SODIUM 88 MCG PO TABS
88.0000 ug | ORAL_TABLET | Freq: Every day | ORAL | 2 refills | Status: AC
Start: 2024-08-28 — End: ?

## 2024-08-28 MED ORDER — ESCITALOPRAM OXALATE 20 MG PO TABS: 20.0000 mg | ORAL_TABLET | Freq: Every day | ORAL | 1 refills | Status: AC

## 2024-08-28 NOTE — Telephone Encounter (Signed)
 Work note sent to [employer] for her to return 08/31/24

## 2024-08-28 NOTE — Telephone Encounter (Signed)
 Copied from CRM #[id number]. Topic: Clinical - Medical Advice >> Aug 28, 2024 12:37 PM Nathanel BROCKS wrote: Reason for CRM: pt called back and forgot to get a drs note to be out of work and she is a couple days off to have time to feel better. She uses mychart and if you could please upload this dr note to her mychart is would be great.  Please advise pt.

## 2024-08-28 NOTE — Telephone Encounter (Signed)
Left message making patient aware.

## 2024-08-28 NOTE — Progress Notes (Signed)
 Left message making patient aware that note has been written for patient to be out of work until 9/5 and should have access to the note through her mychart per PCP.

## 2024-08-28 NOTE — Patient Instructions (Signed)

## 2024-08-28 NOTE — Telephone Encounter (Signed)
Pt called back and verbalized understanding.

## 2024-08-28 NOTE — Progress Notes (Signed)
 Subjective:    Patient ID: Sarah Fox, female    DOB: March 31, 1966, 58 y.o.   MRN: [id number]  Chief Complaint  Patient presents with   Suture / Staple Removal   Medical Management of Chronic Issues   PT presents to the office today for  chronic follow up.   She is followed by Cardiologists annually for CAD and STEMI in 2018.    She fell 08/11/24 and went to the ED and had 5 sutures of right elbow. Needs removed today. Still complaining of left hand pain. Reports aching pain of 6 out 10.   Complaining a redness, swelling and pain of right lower leg. Noticed it yesterday and reports 10 out 10 pain when walking on it.  Congestive Heart Failure Presents for follow-up visit. Associated symptoms include fatigue. Pertinent negatives include no edema. The symptoms have been stable.  Gastroesophageal Reflux She complains of belching and heartburn. This is a chronic problem. The current episode started more than 1 year ago. The problem occurs rarely. The symptoms are aggravated by certain foods. Associated symptoms include fatigue. She has tried a PPI for the symptoms. The treatment provided moderate relief.  Thyroid  Problem Presents for follow-up visit. Symptoms include depressed mood, diarrhea and fatigue. Patient reports no anxiety, constipation or dry skin. The symptoms have been stable.  Arthritis Presents for follow-up visit. She complains of pain and stiffness. Affected locations include the right shoulder and left shoulder. Her pain is at a severity of 0/10 (4 on some days). Associated symptoms include diarrhea and fatigue.  Anxiety Presents for follow-up visit. Symptoms include depressed mood. Patient reports no nervous/anxious behavior or restlessness. Symptoms occur rarely. The severity of symptoms is mild.    Depression        This is a chronic problem.  The current episode started more than 1 year ago.   The problem occurs intermittently.  Associated symptoms include fatigue.   Associated symptoms include no helplessness, no hopelessness, no restlessness and not sad.  Past treatments include SSRIs - Selective serotonin reuptake inhibitors.  Past medical history includes thyroid  problem and anxiety.   Suture / Staple Removal The sutures were placed 11 to 14 days ago. The redness has improved. The swelling has improved. The pain has improved.      Review of Systems  Constitutional:  Positive for fatigue.  Gastrointestinal:  Positive for diarrhea and heartburn. Negative for constipation.  Musculoskeletal:  Positive for stiffness.  Psychiatric/Behavioral:  The patient is not nervous/anxious.   All other systems reviewed and are negative.   Family History  Problem Relation Age of Onset   Breast cancer Mother 19   Other Father    Breast cancer Sister 5   Breast cancer Cousin 67 - 23       60s was a 2nd ocurrence   Heart disease Brother    Breast cancer Other    Breast cancer Other    Colon cancer Neg Hx    Esophageal cancer Neg Hx    Rectal cancer Neg Hx    Stomach cancer Neg Hx    Social History   Socioeconomic History   Marital status: Married    Spouse name: Not on file   Number of children: 1   Years of education: Not on file   Highest education level: Not on file  Occupational History   Occupation: Mail carrier    Employer: [employer]  Tobacco Use   Smoking status: Former    Current  packs/day: 0.00    Average packs/day: 0.5 packs/day for 20.0 years (10.0 ttl pk-yrs)    Types: Cigarettes    Start date: 11/28/1997    Quit date: 11/28/2017    Years since quitting: 6.7   Smokeless tobacco: Never  Vaping Use   Vaping status: Never Used  Substance and Sexual Activity   Alcohol use: Yes    Alcohol/week: 28.0 standard drinks of alcohol    Types: 28 Cans of beer per week    Comment: 4 16 oz beers nightly   Drug use: No   Sexual activity: Yes    Comment: ptp states she cannot be pregnant, but not using anything to not get prenant  Other  Topics Concern   Not on file  Social History Narrative   Married with one child.   Social Drivers of Corporate investment banker Strain: Not on file  Food Insecurity: Not on file  Transportation Needs: Not on file  Physical Activity: Not on file  Stress: Stress Concern Present (11/29/2017)   [employer] of Occupational Health - Occupational Stress Questionnaire    Feeling of Stress : Very much  Social Connections: Not on file       Objective:   Physical Exam Vitals reviewed.  Constitutional:      General: She is not in acute distress.    Appearance: She is well-developed.  HENT:     Head: Normocephalic and atraumatic.     Right Ear: Tympanic membrane normal.     Left Ear: Tympanic membrane normal.  Eyes:     Pupils: Pupils are equal, round, and reactive to light.  Neck:     Thyroid : No thyromegaly.  Cardiovascular:     Rate and Rhythm: Normal rate and regular rhythm.     Heart sounds: Normal heart sounds. No murmur heard. Pulmonary:     Effort: Pulmonary effort is normal. No respiratory distress.     Breath sounds: Normal breath sounds. No wheezing.  Abdominal:     General: Bowel sounds are normal. There is no distension.     Palpations: Abdomen is soft.     Tenderness: There is no abdominal tenderness.  Musculoskeletal:        General: Tenderness present.     Cervical back: Normal range of motion and neck supple.     Comments: Pain in dorsum hand and swelling  Skin:    General: Skin is warm and dry.     Findings: Erythema present. No rash.         Comments: Quarter size erythemas and small blister  Neurological:     Mental Status: She is alert and oriented to person, place, and time.     Cranial Nerves: No cranial nerve deficit.     Deep Tendon Reflexes: Reflexes are normal and symmetric.  Psychiatric:        Behavior: Behavior normal.        Thought Content: Thought content normal.        Judgment: Judgment normal.     5 sutures removed of left  elbow  BP 136/73   Pulse 71   Temp 97.6 F (36.4 C) (Temporal)   Ht 5' 5 (1.651 m)   Wt 151 lb 12.8 oz (68.9 kg)   LMP 05/11/2021   SpO2 96%   BMI 25.26 kg/m      Assessment & Plan:  Sarah Fox comes in today with chief complaint of Suture / Staple Removal and Medical Management of Chronic  Issues   Diagnosis and orders addressed:  1. Depression, major, single episode, moderate (HCC) - escitalopram  (LEXAPRO ) 20 MG tablet; Take 1 tablet (20 mg total) by mouth daily. **NEEDS TO BE SEEN BEFORE NEXT REFILL**  Dispense: 90 tablet; Refill: 1 - CMP14+EGFR  2. GAD (generalized anxiety disorder) - escitalopram  (LEXAPRO ) 20 MG tablet; Take 1 tablet (20 mg total) by mouth daily. **NEEDS TO BE SEEN BEFORE NEXT REFILL**  Dispense: 90 tablet; Refill: 1 - CMP14+EGFR  3. CAD in native artery - CMP14+EGFR  4. Gastroesophageal reflux disease, unspecified whether esophagitis present - CMP14+EGFR  5. Coronary artery disease due to lipid rich plaque - CMP14+EGFR  6. Hyperlipidemia LDL goal <70 - CMP14+EGFR  7. Hypothyroidism, unspecified type - levothyroxine  (SYNTHROID ) 88 MCG tablet; Take 1 tablet (88 mcg total) by mouth daily.  Dispense: 90 tablet; Refill: 2 - CMP14+EGFR - TSH  8. Primary osteoarthritis of left hip - CMP14+EGFR  9. History of ST elevation myocardial infarction (STEMI) - CMP14+EGFR  10. Visit for suture removal Removal sutures, pt tolerated well - CMP14+EGFR  11. Pain of left hand X-ray pending  Continue to wear splint  - DG Hand Complete Left; Future - CMP14+EGFR  12. Cellulitis of right lower extremity (Primary) Start doxycycline   Area marked  Follow up in 1 week - doxycycline  (VIBRA -TABS) 100 MG tablet; Take 1 tablet (100 mg total) by mouth 2 (two) times daily.  Dispense: 20 tablet; Refill: 0 - CMP14+EGFR  Labs pending Continue current medications  Health Maintenance reviewed Diet and exercise encouraged  Follow up plan: 1 week for  cellulitis    Bari Learn, FNP

## 2024-08-29 LAB — CMP14+EGFR
ALT: 22 IU/L (ref 0–32)
AST: 19 IU/L (ref 0–40)
Albumin: 4.3 g/dL (ref 3.8–4.9)
Alkaline Phosphatase: 85 IU/L (ref 44–121)
BUN/Creatinine Ratio: 11 (ref 9–23)
BUN: 8 mg/dL (ref 6–24)
Bilirubin Total: 0.3 mg/dL (ref 0.0–1.2)
CO2: 22 mmol/L (ref 20–29)
Calcium: 9.4 mg/dL (ref 8.7–10.2)
Chloride: 105 mmol/L (ref 96–106)
Creatinine, Ser: 0.72 mg/dL (ref 0.57–1.00)
Globulin, Total: 2.2 g/dL (ref 1.5–4.5)
Glucose: 92 mg/dL (ref 70–99)
Potassium: 3.7 mmol/L (ref 3.5–5.2)
Sodium: 143 mmol/L (ref 134–144)
Total Protein: 6.5 g/dL (ref 6.0–8.5)
eGFR: 99 mL/min/1.73 (ref >59)

## 2024-08-29 LAB — TSH: TSH: 3.64 u[IU]/mL (ref 0.450–4.500)

## 2024-08-31 ENCOUNTER — Telehealth: Payer: Self-pay | Admitting: Family Medicine

## 2024-08-31 NOTE — Telephone Encounter (Signed)
 Copied from CRM [id number]. Topic: Appointments - Appointment Scheduling >> Aug 31, 2024  4:13 PM Sophia H wrote:  Per OV notes needs 1 week follow up for cellulitis, nothing available till Sept 23. Please reach out and advise if she can see another provider for this. Ty # [phone]

## 2024-08-31 NOTE — Telephone Encounter (Signed)
 Called patient and made appointment

## 2024-09-04 ENCOUNTER — Encounter: Payer: Self-pay | Admitting: Family

## 2024-09-04 ENCOUNTER — Ambulatory Visit: Admitting: Family

## 2024-09-04 VITALS — BP 122/76 | HR 61 | Temp 97.4°F | Ht 65.0 in | Wt 153.4 lb

## 2024-09-04 DIAGNOSIS — L03115 Cellulitis of right lower limb: Secondary | ICD-10-CM | POA: Diagnosis not present

## 2024-09-04 DIAGNOSIS — W19XXXD Unspecified fall, subsequent encounter: Secondary | ICD-10-CM | POA: Diagnosis not present

## 2024-09-04 DIAGNOSIS — M79642 Pain in left hand: Secondary | ICD-10-CM | POA: Diagnosis not present

## 2024-09-04 NOTE — Progress Notes (Signed)
 Subjective:    Patient ID: Sarah Fox, female    DOB: [birth year], 58 y.o.   MRN: [id number]  Chief Complaint  Patient presents with   Follow-up    HPI PT presents to the office recheck cellulitis on right lower leg from a possible insect bite. She was started on doxycyline and still has two days left.   She continues to have left hand pain from her fall. She has a negative x-ray. Reports she has knot on her dorsum of her hand that has not decreased. Reports aching pain of 6 out 10. She has continues to wear a brace.    Review of Systems  All other systems reviewed and are negative.   Social History   Socioeconomic History   Marital status: Married    Spouse name: Not on file   Number of children: 1   Years of education: Not on file   Highest education level: Not on file  Occupational History   Occupation: Mail carrier    Employer: [employer]  Tobacco Use   Smoking status: Former    Current packs/day: 0.00    Average packs/day: 0.5 packs/day for 20.0 years (10.0 ttl pk-yrs)    Types: Cigarettes    Start date: 11/28/1997    Quit date: 11/28/2017    Years since quitting: 6.7   Smokeless tobacco: Never  Vaping Use   Vaping status: Never Used  Substance and Sexual Activity   Alcohol use: Yes    Alcohol/week: 28.0 standard drinks of alcohol    Types: 28 Cans of beer per week    Comment: 4 16 oz beers nightly   Drug use: No   Sexual activity: Yes    Comment: ptp states she cannot be pregnant, but not using anything to not get prenant  Other Topics Concern   Not on file  Social History Narrative   Married with one child.   Social Drivers of Corporate investment banker Strain: Not on file  Food Insecurity: Not on file  Transportation Needs: Not on file  Physical Activity: Not on file  Stress: Stress Concern Present (11/29/2017)   [employer] of Occupational Health - Occupational Stress Questionnaire    Feeling of Stress : Very much  Social  Connections: Not on file   Family History  Problem Relation Age of Onset   Breast cancer Mother 25   Other Father    Breast cancer Sister 40   Breast cancer Cousin 52 - 63       18s was a 2nd ocurrence   Heart disease Brother    Breast cancer Other    Breast cancer Other    Colon cancer Neg Hx    Esophageal cancer Neg Hx    Rectal cancer Neg Hx    Stomach cancer Neg Hx         Objective:   Physical Exam Vitals reviewed.  Constitutional:      General: She is not in acute distress.    Appearance: She is well-developed.  HENT:     Head: Normocephalic and atraumatic.  Eyes:     Pupils: Pupils are equal, round, and reactive to light.  Neck:     Thyroid : No thyromegaly.  Cardiovascular:     Rate and Rhythm: Normal rate and regular rhythm.     Heart sounds: Normal heart sounds. No murmur heard. Pulmonary:     Effort: Pulmonary effort is normal. No respiratory distress.     Breath  sounds: Normal breath sounds. No wheezing.  Abdominal:     General: Bowel sounds are normal. There is no distension.     Palpations: Abdomen is soft.     Tenderness: There is no abdominal tenderness.  Musculoskeletal:        General: Tenderness present. Normal range of motion.     Left hand: Swelling present. Decreased strength.       Arms:     Cervical back: Normal range of motion and neck supple.     Comments: Lump on dorsum left hand, decrease grip  Skin:    General: Skin is warm and dry.     Findings: Erythema present.         Comments: Small mildly erythemas area that is apporx 0.8X0.8 cm that is greatly improving, no discharge, warmth or discharge  Neurological:     Mental Status: She is alert and oriented to person, place, and time.     Cranial Nerves: No cranial nerve deficit.     Deep Tendon Reflexes: Reflexes are normal and symmetric.  Psychiatric:        Behavior: Behavior normal.        Thought Content: Thought content normal.        Judgment: Judgment normal.       BP  122/76   Pulse 61   Temp (!) 97.4 F (36.3 C) (Temporal)   Ht 5' 5 (1.651 m)   Wt 153 lb 6.4 oz (69.6 kg)   LMP 05/11/2021   SpO2 97%   BMI 25.53 kg/m      Assessment & Plan:  Paizley Ramella comes in today with chief complaint of Follow-up   Diagnosis and orders addressed:  1. Cellulitis of right lower extremity (Primary) Greatly improved! Continue doxycyline  Report any increased redness, fever, discharge, or pain  2. Pain of left hand  3. Fall, subsequent encounter   Continue to ice, rest, compression If hand continues to hurt may need to order MRI to rule tendon damage Follow up as needed   Bari Learn, FNP

## 2024-09-04 NOTE — Patient Instructions (Signed)
Hand Pain Hand pain can make it hard to do daily activities. Many things can cause hand pain. Some common causes are: Injuries. These may include: Broken bones (fractures)and cuts. Overuse injuries from doing the same movements many times (repetitive activity). Arthritis. Lumps in the tendons or joints of the hand and wrist (ganglion cysts). Nerve compression syndromes (carpal tunnel syndrome). Inflammation of the tendons (tendinitis). Infection. Follow these instructions at home: Managing pain, stiffness, and swelling     Take over-the-counter and prescription medicines only as told by your health care provider. If told, put ice on the affected area. Put ice in a plastic bag. Place a towel between your skin and the bag. Leave the ice on for 20 minutes, 2-3 times a day. If told, apply heat to the affected area before you exercise or as often as told by your provider. Use the heat source that your provider recommends, such as a moist heat pack or a heating pad. Place a towel between your skin and the heat source. Leave the heat on for 20-30 minutes. If your skin turns bright red, remove the ice or heat right away to prevent skin damage. The risk of damage is higher if you cannot feel pain, heat, or cold. Activity Take breaks from repetitive activity often. Minimize stress on your hands and wrists as much as possible. Do stretches or exercises as told by your provider. Do not do activities that make your pain worse. Wear a hand splint or support as told by your provider. Contact a health care provider if: Your pain does not get better after a few days. Your pain gets worse. Your pain affects your ability to do your daily activities. Your hand becomes warm, red, or swollen. Your hand is numb or tingling. Get help right away if: Your hand is extremely swollen or is an unusual shape. Your hand or fingers turn white or blue. You cannot move your hand, wrist, or fingers. This  information is not intended to replace advice given to you by your health care provider. Make sure you discuss any questions you have with your health care provider. Document Revised: 07/21/2022 Document Reviewed: 07/21/2022 Elsevier Patient Education  2024 Elsevier Inc.  

## 2024-09-10 ENCOUNTER — Other Ambulatory Visit: Payer: Self-pay | Admitting: Family

## 2024-09-18 ENCOUNTER — Telehealth: Payer: Self-pay

## 2024-09-18 NOTE — Telephone Encounter (Signed)
 Left message for patient to call back. Need more info on why she wants an MRI and also she will need an appt with PCP to get MRI ordered.

## 2024-09-18 NOTE — Telephone Encounter (Signed)
 Copied from CRM #[id number]. Topic: Appointments - Scheduling Inquiry for Clinic >> Sep 18, 2024 11:11 AM Jasmin G wrote: Reason for CRM: Pt called to leave a message to her PCP requesting to be scheduled for an MRI. Call pt back at [phone].

## 2024-09-24 ENCOUNTER — Telehealth: Payer: Self-pay | Admitting: Family Medicine

## 2024-09-24 DIAGNOSIS — M79642 Pain in left hand: Secondary | ICD-10-CM

## 2024-09-24 NOTE — Telephone Encounter (Unsigned)
 Copied from CRM #[id number]. Topic: Clinical - Request for Lab/Test Order >> Sep 24, 2024  3:55 PM Rosaria E wrote: Reason for CRM: Pt wants to have an MRI of her left hand, says she has seen her PCP about this recently.   She injured it 7 weeks ago and was told to schedule an MRI at this time.   Also says you can send her to a hand specialist if needed. But definitely wants an MRI.

## 2024-09-25 NOTE — Telephone Encounter (Signed)
 Referral to Hand specialists and MRI ordered. Continue diclofenac  BID. No other NSAID's

## 2024-09-25 NOTE — Telephone Encounter (Signed)
 Called and left detailed message.

## 2024-10-03 ENCOUNTER — Ambulatory Visit (HOSPITAL_COMMUNITY)
Admission: RE | Admit: 2024-10-03 | Discharge: 2024-10-03 | Disposition: A | Discharge status: Home / Self Care | Source: Ambulatory Visit | Attending: Family | Admitting: Family

## 2024-10-03 DIAGNOSIS — R600 Localized edema: Secondary | ICD-10-CM | POA: Diagnosis not present

## 2024-10-03 DIAGNOSIS — M79642 Pain in left hand: Secondary | ICD-10-CM | POA: Insufficient documentation

## 2024-10-04 ENCOUNTER — Ambulatory Visit: Payer: Self-pay | Admitting: Family

## 2024-10-08 ENCOUNTER — Telehealth: Payer: Self-pay | Admitting: Family Medicine

## 2024-10-08 ENCOUNTER — Ambulatory Visit: Admitting: Nurse Practitioner

## 2024-10-08 ENCOUNTER — Encounter: Payer: Self-pay | Admitting: Nurse Practitioner

## 2024-10-08 VITALS — BP 122/74 | HR 79 | Temp 97.1°F | Ht 65.0 in | Wt 152.8 lb

## 2024-10-08 DIAGNOSIS — L03115 Cellulitis of right lower limb: Secondary | ICD-10-CM | POA: Diagnosis not present

## 2024-10-08 MED ORDER — CEPHALEXIN 500 MG PO CAPS: 500.0000 mg | ORAL_CAPSULE | Freq: Two times a day (BID) | ORAL | 0 refills | Status: AC

## 2024-10-08 NOTE — Progress Notes (Signed)
 Subjective:  Patient ID: Sarah Fox, female    DOB: 18-Jul-1966, 58 y.o.   MRN: [id number]  Patient Care Team: Lavell Bari LABOR, FNP as PCP - General (Family Medicine) Claudene Victory ORN, MD (Inactive) as PCP - Cardiology (Cardiology)   Chief Complaint:  No chief complaint on file.   HPI: Sarah Fox is a 59 y.o. female presenting on 10/08/2024 for No chief complaint on file.   Discussed the use of AI scribe software for clinical note transcription with the patient, who gave verbal consent to proceed.  History of Present Illness Sarah Fox is a 58 year old female who presents with recurrence of cellulitis in the right lower leg.  The current episode of cellulitis began on Saturday evening, affecting the right lower leg, particularly near the calf. Pain is rated as ten out of ten when standing up after sitting, but it improves with movement. Swelling decreased after elevating the leg overnight.  There is no history of recent trauma or insect bites to the area. No associated fever or vomiting. She recalls a previous episode of cellulitis in the same leg on September 04, 2024, which was treated with doxycycline .  She recalls being given a medication starting with 'D', possibly doxycycline , during her last treatment. She is currently using [facility] for her medications.      Relevant past medical, surgical, family, and social history reviewed and updated as indicated.  Allergies and medications reviewed and updated. Data reviewed: Chart in Epic.   Past Medical History:  Diagnosis Date   Acute ST elevation myocardial infarction (STEMI) of anterior wall (HCC)    Allergy    CAD in native artery    Cardiac arrest with ventricular fibrillation (HCC) 10/2017   with acute MI and hypokalemia   Coronary artery disease    GERD (gastroesophageal reflux disease)    Ischemic cardiomyopathy 10/2017   Myocardial infarction acute (HCC) 10/2017   Thyroid  disease    hypo   VF  (ventricular fibrillation) (HCC)     Past Surgical History:  Procedure Laterality Date   BIOPSY  02/06/2024   Procedure: BIOPSY;  Surgeon: Cindie Carlin POUR, DO;  [facility];  Service: Endoscopy;;   COLONOSCOPY WITH PROPOFOL  N/A 02/06/2024   Procedure: COLONOSCOPY WITH PROPOFOL ;  Surgeon: Cindie Carlin POUR, DO;  Location[facility];  Service: Endoscopy;  Laterality: N/A;  10:30 AM, ASA 3   CORONARY STENT INTERVENTION N/A 11/29/2017   Procedure: CORONARY STENT INTERVENTION;  Surgeon: Claudene Victory ORN, MD;  Location: [facility];  Service: Cardiovascular;  Laterality: N/A;   CORONARY/GRAFT ACUTE MI REVASCULARIZATION N/A 11/29/2017   Procedure: Coronary/Graft Acute MI Revascularization;  Surgeon: Claudene Victory ORN, MD;  Location: [facility];  Service: Cardiovascular;  Laterality: N/A;   LEFT HEART CATH AND CORONARY ANGIOGRAPHY N/A 11/29/2017   Procedure: LEFT HEART CATH AND CORONARY ANGIOGRAPHY;  Surgeon: Claudene Victory ORN, MD;  Location: [facility];  Service: Cardiovascular;  Laterality: N/A;   LUMBAR DISC SURGERY     L5-S1   POLYPECTOMY  02/06/2024   Procedure: POLYPECTOMY;  Surgeon: Cindie Carlin POUR, DO;  Location: [facility];  Service: Endoscopy;;    Social History   Socioeconomic History   Marital status: Married    Spouse name: Not on file   Number of children: 1   Years of education: Not on file   Highest education level: Not on file  Occupational History  Occupation: Retail banker: [employer]  Tobacco Use   Smoking status: Former    Current packs/day: 0.00    Average packs/day: 0.5 packs/day for 20.0 years (10.0 ttl pk-yrs)    Types: Cigarettes    Start date: 11/28/1997    Quit date: 11/28/2017    Years since quitting: 6.8   Smokeless tobacco: Never  Vaping Use   Vaping status: Never Used  Substance and Sexual Activity   Alcohol use: Yes    Alcohol/week: 28.0 standard drinks of alcohol    Types: 28 Cans of beer per week     Comment: 4 16 oz beers nightly   Drug use: No   Sexual activity: Yes    Comment: ptp states she cannot be pregnant, but not using anything to not get prenant  Other Topics Concern   Not on file  Social History Narrative   Married with one child.   Social Drivers of Corporate investment banker Strain: Not on file  Food Insecurity: Not on file  Transportation Needs: Not on file  Physical Activity: Not on file  Stress: Stress Concern Present (11/29/2017)   [employer] of Occupational Health - Occupational Stress Questionnaire    Feeling of Stress : Very much  Social Connections: Not on file  Intimate Partner Violence: Not on file    Outpatient Encounter Medications as of 10/08/2024  Medication Sig   atorvastatin  (LIPITOR ) 40 MG tablet TAKE 1 TABLET BY MOUTH EVERY DAY   baclofen  (LIORESAL ) 10 MG tablet Take 1 tablet (10 mg total) by mouth 3 (three) times daily.   clopidogrel  (PLAVIX ) 75 MG tablet TAKE 1 TABLET BY MOUTH EVERY DAY   diclofenac  (VOLTAREN ) 75 MG EC tablet Take 1 tablet (75 mg total) by mouth 2 (two) times daily.   doxycycline  (VIBRA -TABS) 100 MG tablet Take 1 tablet (100 mg total) by mouth 2 (two) times daily.   empagliflozin  (JARDIANCE ) 10 MG TABS tablet Take 1 tablet (10 mg total) by mouth daily.   escitalopram  (LEXAPRO ) 20 MG tablet Take 1 tablet (20 mg total) by mouth daily. **NEEDS TO BE SEEN BEFORE NEXT REFILL**   fluticasone  (FLONASE ) 50 MCG/ACT nasal spray SPRAY 2 SPRAYS INTO EACH NOSTRIL EVERY DAY   levothyroxine  (SYNTHROID ) 88 MCG tablet Take 1 tablet (88 mcg total) by mouth daily.   losartan  (COZAAR ) 25 MG tablet TAKE 1 TABLET (25 MG TOTAL) BY MOUTH DAILY.   metoprolol  succinate (TOPROL -XL) 25 MG 24 hr tablet TAKE 1 TABLET (25 MG TOTAL) BY MOUTH DAILY.   nitroGLYCERIN  (NITROSTAT ) 0.4 MG SL tablet Place 1 tablet (0.4 mg total) under the tongue every 5 (five) minutes as needed for chest pain.   pantoprazole  (PROTONIX ) 40 MG tablet TAKE 1 TABLET BY MOUTH  TWICE A DAY   No facility-administered encounter medications on file as of 10/08/2024.    Allergies  Allergen Reactions   Sulfa  Antibiotics Rash    Pertinent ROS per HPI, otherwise unremarkable      Objective:  LMP 05/11/2021    Wt Readings from Last 3 Encounters:  09/04/24 153 lb 6.4 oz (69.6 kg)  08/28/24 151 lb 12.8 oz (68.9 kg)  08/14/24 152 lb 3.2 oz (69 kg)    Physical Exam Vitals and nursing note reviewed.  Constitutional:      General: She is not in acute distress. HENT:     Head: Normocephalic and atraumatic.     Nose: Nose normal.     Mouth/Throat:  Mouth: Mucous membranes are moist.  Eyes:     General: No scleral icterus.    Extraocular Movements: Extraocular movements intact.     Conjunctiva/sclera: Conjunctivae normal.     Pupils: Pupils are equal, round, and reactive to light.  Cardiovascular:     Heart sounds: Normal heart sounds.  Pulmonary:     Effort: Pulmonary effort is normal.     Breath sounds: Normal breath sounds.  Musculoskeletal:        General: Normal range of motion.     Right lower leg: No edema.  Skin:    General: Skin is warm and dry.     Capillary Refill: Capillary refill takes less than 2 seconds.  Neurological:     Mental Status: She is alert and oriented to person, place, and time.  Psychiatric:        Mood and Affect: Mood normal.        Behavior: Behavior normal.        Thought Content: Thought content normal.        Judgment: Judgment normal.    Physical Exam GENERAL: Right lower leg is warm to touch and very tender.     Results for orders placed or performed in visit on 08/28/24  CMP14+EGFR   Collection Time: 08/28/24 12:52 PM  Result Value Ref Range   Glucose 92 70 - 99 mg/dL   BUN 8 6 - 24 mg/dL   Creatinine, Ser 9.27 0.57 - 1.00 mg/dL   eGFR 99 >40 fO/fpw/8.26   BUN/Creatinine Ratio 11 9 - 23   Sodium 143 134 - 144 mmol/L   Potassium 3.7 3.5 - 5.2 mmol/L   Chloride 105 96 - 106 mmol/L   CO2 22 20 -  29 mmol/L   Calcium  9.4 8.7 - 10.2 mg/dL   Total Protein 6.5 6.0 - 8.5 g/dL   Albumin 4.3 3.8 - 4.9 g/dL   Globulin, Total 2.2 1.5 - 4.5 g/dL   Bilirubin Total 0.3 0.0 - 1.2 mg/dL   Alkaline Phosphatase 85 44 - 121 IU/L   AST 19 0 - 40 IU/L   ALT 22 0 - 32 IU/L  TSH   Collection Time: 08/28/24 12:52 PM  Result Value Ref Range   TSH 3.640 0.450 - 4.500 uIU/mL       Pertinent labs & imaging results that were available during my care of the patient were reviewed by me and considered in my medical decision making.  Assessment & Plan:  There are no diagnoses linked to this encounter.   Assessment and Plan Jahayra is a 58 yrs old caucasian female seen today fro cellulitis, no acute distress Assessment & Plan Cellulitis of right lower leg Recurrent cellulitis treated with doxycycline  previously. Current episode with severe pain, redness, and warmth.  - Prescribed Keflex  (cephalexin ) twice daily for 10 days with food. - Instructed to mark area with Sharpie to monitor size changes. - Advised follow-up as needed. - Instructed to obtain medication from pharmacy.      Continue all other maintenance medications.  Follow up plan: No follow-ups on file.   Continue healthy lifestyle choices, including diet (rich in fruits, vegetables, and lean proteins, and low in salt and simple carbohydrates) and exercise (at least 30 minutes of moderate physical activity daily).  Educational handout given for    Cellulitis, Adult  Cellulitis is a skin infection. The infected area is often warm, red, swollen, and sore. It occurs most often on the legs, feet, and toes, but  can happen on any part of the body. This condition can be life-threatening without treatment. It is very important to get treated right away. What are the causes? This condition is caused by bacteria. The bacteria enter through a break in the skin, such as: A cut. A burn. A bug bite. An animal bite. An open sore. A  crack. What increases the risk? Having a weak body's defense system (immune system). Being older than 56 years old. Having a blood sugar problem (diabetes). Having a long-term liver disease (cirrhosis) or kidney disease. Being very overweight (obese). Having a skin problem, such as: An itchy rash. A rash caused by a fungus. A rash with blisters. Slow movement of blood in the veins (venous stasis). Fluid buildup below the skin (edema). This condition is more likely to occur in people who: Have open cuts, burns, bites, or scrapes on the skin. Have been treated with high-energy rays (radiation). Use IV drugs. What are the signs or symptoms? Skin that: Looks red or purple, or slightly darker than your usual skin color. Has streaks. Has spots. Is swollen. Is sore or painful when you touch it. Is warm. A fever. Chills. Blisters. Tiredness (fatigue). How is this treated? Medicines to treat infections or allergies. Rest. Placing cold or warm cloths on the skin. Staying in the hospital, if the condition is very bad. You may need medicines through an IV. Follow these instructions at home: Medicines Take over-the-counter and prescription medicines only as told by your doctor. If you were prescribed antibiotics, take them as told by your doctor. Do not stop using them even if you start to feel better. General instructions Drink enough fluid to keep your pee (urine) pale yellow. Do not touch or rub the infected area. Raise (elevate) the infected area above the level of your heart while you are sitting or lying down. Return to your normal activities when your doctor says that it is safe. Place cold or warm cloths on the area as told by your doctor. Keep all follow-up visits. Your doctor will need to make sure that a more serious infection is not developing. Contact a doctor if: You have a fever. You do not start to get better after 1-2 days of treatment. Your bone or joint under the  infected area starts to hurt after the skin has healed. Your infection comes back in the same area or another area. Signs of this may include: You have a swollen bump in the area. Your red area gets larger, turns dark in color, or hurts more. You have more fluid coming from the wound. Pus or a bad smell develops in your infected area. You have more pain. You feel sick and have muscle aches and weakness. You develop vomiting or watery poop that will not go away. Get help right away if: You see red streaks coming from the area. You notice the skin turns purple or black and falls off. These symptoms may be an emergency. Get help right away. Call 911. Do not wait to see if the symptoms will go away. Do not drive yourself to the hospital. This information is not intended to replace advice given to you by your health care provider. Make sure you discuss any questions you have with your health care provider. Document Revised: 08/10/2022 Document Reviewed: 08/10/2022 Elsevier Patient Education  2024 Elsevier Inc.    The above assessment and management plan was discussed with the patient. The patient verbalized understanding of and has agreed to the management  plan. Patient is aware to call the clinic if they develop any new symptoms or if symptoms persist or worsen. Patient is aware when to return to the clinic for a follow-up visit. Patient educated on when it is appropriate to go to the emergency department.    Wilkie Zenon [place] Thompson, DNP [facility] [address] [place], [place] [address] [phone]

## 2024-10-08 NOTE — Telephone Encounter (Signed)
 Patient needs an appointment can be with who ever has opening     Copied from CRM [id number]. Topic: Clinical - Medical Advice >> Oct 08, 2024 10:11 AM [name] wrote: Reason for CRM: Patient states that her cellulitis has come back in her right leg. She would like to know if provider would prescribe her something or would she like for her to come in for an appt. Please contact patient at [phone] for additional information.

## 2024-10-08 NOTE — Telephone Encounter (Signed)
 Apt scheduled.

## 2024-10-11 DIAGNOSIS — S62145A Nondisplaced fracture of body of hamate [unciform] bone, left wrist, initial encounter for closed fracture: Secondary | ICD-10-CM | POA: Diagnosis not present

## 2024-10-18 DIAGNOSIS — S62102A Fracture of unspecified carpal bone, left wrist, initial encounter for closed fracture: Secondary | ICD-10-CM | POA: Diagnosis not present

## 2024-10-18 DIAGNOSIS — S62152A Displaced fracture of hook process of hamate [unciform] bone, left wrist, initial encounter for closed fracture: Secondary | ICD-10-CM | POA: Diagnosis not present

## 2024-10-23 DIAGNOSIS — S62145A Nondisplaced fracture of body of hamate [unciform] bone, left wrist, initial encounter for closed fracture: Secondary | ICD-10-CM | POA: Diagnosis not present

## 2024-10-24 ENCOUNTER — Telehealth (HOSPITAL_BASED_OUTPATIENT_CLINIC_OR_DEPARTMENT_OTHER): Payer: Self-pay

## 2024-10-24 NOTE — Telephone Encounter (Signed)
   Pre-operative Risk Assessment    Patient Name: Sarah Fox  DOB: 02-25-1966 MRN: [id number]   Date of last office visit: 02/20/2024 - Sarah Fabry, PA-C Date of next office visit: N/A   Request for Surgical Clearance    Procedure:  Left wrist excision of the hook of the hamate and ulnar nerve decompression with block and IV sedation  Date of Surgery:  Clearance TBD                                 Surgeon:  Dr. Prentice Pagan Surgeon's Group or Practice Name:  [facility] Phone number:  [phone]  Fax number:  [phone] or [phone] (Attention: Duwaine Moats)   Type of Clearance Requested:   - Medical  - Pharmacy:  Hold Clopidogrel  (Plavix ) - Please give medication instructions   Type of Anesthesia:  Block and IV sedation   Additional requests/questions:  N/A  Sarah Fox   10/24/2024, 10:04 AM

## 2024-10-24 NOTE — Telephone Encounter (Signed)
   Name: Sarah Fox  DOB: [birth year]  MRN: [id number]  Primary Cardiologist: Victory LELON Claudene DOUGLAS, MD (Inactive)   Preoperative team, please contact this patient and set up a phone call appointment for further preoperative risk assessment. Please obtain consent and complete medication review. Thank you for your help.  I confirm that guidance regarding antiplatelet and oral anticoagulation therapy has been completed and, if necessary, noted below.  Per office protocol, if patient is without any new symptoms or concerns at the time of their virtual visit, she may hold Plavix  for 5 days prior to procedure. Please resume Plavix  as soon as possible postprocedure, at the discretion of the surgeon.    I also confirmed the patient resides in the state of [place] . As per [facility] Medical Board telemedicine laws, the patient must reside in the state in which the provider is licensed.   Lamarr Satterfield, NP 10/24/2024, 11:07 AM [facility]

## 2024-11-05 ENCOUNTER — Telehealth: Payer: Self-pay | Admitting: *Deleted

## 2024-11-05 NOTE — Telephone Encounter (Signed)
 Spoke with pt and scheduled her for a preop telehealth visit for tomorrow 11/06/24. Will route to requesting surgeons office to make them aware.

## 2024-11-05 NOTE — Telephone Encounter (Signed)
  Patient Consent for Virtual Visit        Sarah Fox has provided verbal consent on 11/05/2024 for a virtual visit (video or telephone).   CONSENT FOR VIRTUAL VISIT FOR:  Sarah Fox  By participating in this virtual visit I agree to the following:  I hereby voluntarily request, consent and authorize [facility] and its employed or contracted physicians, physician assistants, nurse practitioners or other licensed health care professionals (the Practitioner), to provide me with telemedicine health care services (the "Services) as deemed necessary by the treating Practitioner. I acknowledge and consent to receive the Services by the Practitioner via telemedicine. I understand that the telemedicine visit will involve communicating with the Practitioner through live audiovisual communication technology and the disclosure of certain medical information by electronic transmission. I acknowledge that I have been given the opportunity to request an in-person assessment or other available alternative prior to the telemedicine visit and am voluntarily participating in the telemedicine visit.  I understand that I have the right to withhold or withdraw my consent to the use of telemedicine in the course of my care at any time, without affecting my right to future care or treatment, and that the Practitioner or I may terminate the telemedicine visit at any time. I understand that I have the right to inspect all information obtained and/or recorded in the course of the telemedicine visit and may receive copies of available information for a reasonable fee.  I understand that some of the potential risks of receiving the Services via telemedicine include:  Delay or interruption in medical evaluation due to technological equipment failure or disruption; Information transmitted may not be sufficient (e.g. poor resolution of images) to allow for appropriate medical decision making by the  Practitioner; and/or  In rare instances, security protocols could fail, causing a breach of personal health information.  Furthermore, I acknowledge that it is my responsibility to provide information about my medical history, conditions and care that is complete and accurate to the best of my ability. I acknowledge that Practitioner's advice, recommendations, and/or decision may be based on factors not within their control, such as incomplete or inaccurate data provided by me or distortions of diagnostic images or specimens that may result from electronic transmissions. I understand that the practice of medicine is not an exact science and that Practitioner makes no warranties or guarantees regarding treatment outcomes. I acknowledge that a copy of this consent can be made available to me via my patient portal [facility] MyChart), or I can request a printed copy by calling the office of [facility].    I understand that my insurance will be billed for this visit.   I have read or had this consent read to me. I understand the contents of this consent, which adequately explains the benefits and risks of the Services being provided via telemedicine.  I have been provided ample opportunity to ask questions regarding this consent and the Services and have had my questions answered to my satisfaction. I give my informed consent for the services to be provided through the use of telemedicine in my medical care

## 2024-11-05 NOTE — Telephone Encounter (Signed)
 Patient is calling back for update. Please advise

## 2024-11-06 ENCOUNTER — Ambulatory Visit: Attending: Cardiology | Admitting: Emergency Medicine

## 2024-11-06 DIAGNOSIS — Z01810 Encounter for preprocedural cardiovascular examination: Secondary | ICD-10-CM | POA: Diagnosis not present

## 2024-11-06 NOTE — Progress Notes (Signed)
 Virtual Visit via Telephone Note   Because of Sarah Fox co-morbid illnesses, she is at least at moderate risk for complications without adequate follow up.  This format is felt to be most appropriate for this patient at this time.  Due to technical limitations with video connection (technology), today's appointment will be conducted as an audio only telehealth visit, and Sarah Fox verbally agreed to proceed in this manner.   All issues noted in this document were discussed and addressed.  No physical exam could be performed with this format.  Evaluation Performed:  Preoperative cardiovascular risk assessment _____________   Date:  11/06/2024   Patient ID:  Sarah Fox, DOB 05/14/1966, MRN [id number] Patient Location:  Home Provider location:   Office  Primary Care Provider:  Lavell Fox LABOR, FNP Primary Cardiologist:  Sarah LELON Sarah DOUGLAS, Fox (Inactive)  Chief Complaint / Patient Profile   58 y.o. 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  History of Present Illness    Sarah Fox is Fox 58 y.o. female who presents via audio/video conferencing for Fox telehealth visit today.  Pt was last seen in cardiology clinic on 02/20/2024 by Sarah Fabry, PA.  At that time Sarah Fox was stable.  The patient is now pending procedure as outlined above. Since her last visit, She denies chest pain, palpitations, dyspnea, orthopnea, n, v, dark/tarry/bloody stools, hematuria, dizziness, syncope, edema, weight gain.    Past Medical History    Past Medical History:  Diagnosis Date   Acute Sarah elevation myocardial infarction (STEMI) of anterior wall (HCC)    Allergy    CAD in native artery    Cardiac arrest with ventricular fibrillation (HCC)  10/2017   with acute MI and hypokalemia   Coronary artery disease    GERD (gastroesophageal reflux disease)    Ischemic cardiomyopathy 10/2017   Myocardial infarction acute (HCC) 10/2017   Thyroid  disease    hypo   VF (ventricular fibrillation) (HCC)    Past Surgical History:  Procedure Laterality Date   BIOPSY  02/06/2024   Procedure: BIOPSY;  Surgeon: Sarah Fox;  Location: [facility];  Service: Endoscopy;;   COLONOSCOPY WITH PROPOFOL  N/Fox 02/06/2024   Procedure: COLONOSCOPY WITH PROPOFOL ;  Surgeon: Sarah Fox;  Location: [facility];  Service: Endoscopy;  Laterality: N/Fox;  10:30 AM, ASA 3   CORONARY STENT INTERVENTION N/Fox 11/29/2017   Procedure: CORONARY STENT INTERVENTION;  Surgeon: Sarah Fox;  Location: [facility];  Service: Cardiovascular;  Laterality: N/Fox;   CORONARY/GRAFT ACUTE MI REVASCULARIZATION N/Fox 11/29/2017   Procedure: Coronary/Graft Acute MI Revascularization;  Surgeon: Sarah Fox;  Location: [facility];  Service: Cardiovascular;  Laterality: N/Fox;   LEFT HEART CATH AND CORONARY ANGIOGRAPHY N/Fox 11/29/2017   Procedure: LEFT HEART CATH AND CORONARY ANGIOGRAPHY;  Surgeon: Sarah Fox;  Location: [facility];  Service: Cardiovascular;  Laterality: N/Fox;   LUMBAR DISC SURGERY     L5-S1   POLYPECTOMY  02/06/2024   Procedure: POLYPECTOMY;  Surgeon: Sarah Fox;  Location: [facility];  Service: Endoscopy;;    Allergies  Allergies  Allergen Reactions   Sulfa  Antibiotics Rash    Home Medications  Prior to Admission medications   Medication Sig Start Date End Date Taking? Authorizing Provider  atorvastatin  (LIPITOR ) 40 MG tablet TAKE 1 TABLET BY MOUTH EVERY DAY 02/24/24   Sarah Fox  baclofen  (LIORESAL ) 10 MG tablet Take 1 tablet (10 mg total) by mouth 3 (three) times daily. 06/04/24   Sarah Lye A, FNP  cephALEXin  (KEFLEX ) 500 MG capsule Take 1 capsule (500 mg total) by mouth 2  (two) times daily. 10/08/24   Sarah Fox  clopidogrel  (PLAVIX ) 75 MG tablet TAKE 1 TABLET BY MOUTH EVERY DAY 02/24/24   Sarah Fox  diclofenac  (VOLTAREN ) 75 MG EC tablet Take 1 tablet (75 mg total) by mouth 2 (two) times daily. 06/04/24   Sarah Lye A, FNP  doxycycline  (VIBRA -TABS) 100 MG tablet Take 1 tablet (100 mg total) by mouth 2 (two) times daily. 08/28/24   Sarah Lye A, FNP  empagliflozin  (JARDIANCE ) 10 MG TABS tablet Take 1 tablet (10 mg total) by mouth daily. 03/13/24   Sarah Fox  escitalopram  (LEXAPRO ) 20 MG tablet Take 1 tablet (20 mg total) by mouth daily. **NEEDS TO BE SEEN BEFORE NEXT REFILL** 08/28/24   Sarah Lye A, FNP  fluticasone  (FLONASE ) 50 MCG/ACT nasal spray SPRAY 2 SPRAYS INTO EACH NOSTRIL EVERY DAY 03/27/24   Sarah Lye A, FNP  levothyroxine  (SYNTHROID ) 88 MCG tablet Take 1 tablet (88 mcg total) by mouth daily. 08/28/24   Sarah Lye A, FNP  losartan  (COZAAR ) 25 MG tablet TAKE 1 TABLET (25 MG TOTAL) BY MOUTH DAILY. 03/13/24   Sarah Fox  metoprolol  succinate (TOPROL -XL) 25 MG 24 hr tablet TAKE 1 TABLET (25 MG TOTAL) BY MOUTH DAILY. 02/24/24   Sarah Fox  nitroGLYCERIN  (NITROSTAT ) 0.4 MG SL tablet Place 1 tablet (0.4 mg total) under the tongue every 5 (five) minutes as needed for chest pain. 02/15/20   Sarah Sarah ORN, Fox  pantoprazole  (PROTONIX ) 40 MG tablet TAKE 1 TABLET BY MOUTH TWICE Fox DAY 05/02/24   Sarah Fox    Physical Exam    Vital Signs:  Sarah Fox does not have vital signs available for review today.  Given telephonic nature of communication, physical exam is limited. AAOx3. NAD. Normal affect.  Speech and respirations are unlabored.  Accessory Clinical Findings    None  Assessment & Plan    1.  Preoperative Cardiovascular Risk Assessment:  According to the Revised Cardiac Risk Index (RCRI), her Perioperative Risk of Major Cardiac Event is (%): 6.6  Her Functional Capacity  in METs is: 4.64 according to the [facility] Activity Status Index (DASI). Therefore, based on ACC/AHA guidelines, patient would be at acceptable risk for the planned procedure without further cardiovascular testing.  The patient was advised that if she develops new symptoms prior to surgery to contact our office to arrange for Fox follow-up visit, and she verbalized understanding.  Per office protocol, if patient is without any new symptoms or concerns at the time of their virtual visit, she may hold Plavix  for 5 days prior to procedure. Please resume Plavix  as soon as possible postprocedure, at the discretion of the surgeon.   Fox copy of this note will be routed to requesting surgeon.  Time:   Today, I have spent 8 minutes with the patient with telehealth technology discussing medical history, symptoms, and management plan.     Deamonte Sayegh E Kamariyah Timberlake, Fox  11/06/2024, 8:33 AM

## 2024-11-14 ENCOUNTER — Ambulatory Visit

## 2024-11-19 DIAGNOSIS — Y999 Unspecified external cause status: Secondary | ICD-10-CM | POA: Diagnosis not present

## 2024-11-19 DIAGNOSIS — X58XXXA Exposure to other specified factors, initial encounter: Secondary | ICD-10-CM | POA: Diagnosis not present

## 2024-11-19 DIAGNOSIS — S62152K Displaced fracture of hook process of hamate [unciform] bone, left wrist, subsequent encounter for fracture with nonunion: Secondary | ICD-10-CM | POA: Diagnosis not present

## 2024-11-19 DIAGNOSIS — S62152A Displaced fracture of hook process of hamate [unciform] bone, left wrist, initial encounter for closed fracture: Secondary | ICD-10-CM | POA: Diagnosis not present

## 2024-11-30 DIAGNOSIS — S62145D Nondisplaced fracture of body of hamate [unciform] bone, left wrist, subsequent encounter for fracture with routine healing: Secondary | ICD-10-CM | POA: Diagnosis not present

## 2024-12-16 ENCOUNTER — Other Ambulatory Visit: Payer: Self-pay | Admitting: Family

## 2024-12-16 DIAGNOSIS — H9312 Tinnitus, left ear: Secondary | ICD-10-CM

## 2024-12-24 ENCOUNTER — Other Ambulatory Visit: Payer: Self-pay | Admitting: Family

## 2024-12-24 DIAGNOSIS — Z1231 Encounter for screening mammogram for malignant neoplasm of breast: Secondary | ICD-10-CM

## 2024-12-31 ENCOUNTER — Encounter

## 2024-12-31 DIAGNOSIS — Z1231 Encounter for screening mammogram for malignant neoplasm of breast: Secondary | ICD-10-CM

## 2025-01-23 ENCOUNTER — Other Ambulatory Visit: Payer: Self-pay | Admitting: Internal Medicine

## 2025-01-24 ENCOUNTER — Other Ambulatory Visit: Payer: Self-pay | Admitting: Internal Medicine

## 2025-01-28 MED ORDER — PANTOPRAZOLE SODIUM 40 MG PO TBEC: 40.0000 mg | DELAYED_RELEASE_TABLET | Freq: Two times a day (BID) | ORAL | 0 refills | Status: AC

## 2025-02-22 ENCOUNTER — Ambulatory Visit: Admitting: Physician Assistant
